# Patient Record
Sex: Female | Born: 1975
Health system: Southern US, Community
[De-identification: ages and names within clinical notes are randomized; demographics above are authoritative.]

## PROBLEM LIST (undated history)

## (undated) ENCOUNTER — Inpatient Hospital Stay (HOSPITAL_COMMUNITY): Payer: Self-pay

## (undated) DIAGNOSIS — M1380 Other specified arthritis, unspecified site: Secondary | ICD-10-CM

## (undated) DIAGNOSIS — E1165 Type 2 diabetes mellitus with hyperglycemia: Secondary | ICD-10-CM

## (undated) DIAGNOSIS — M1990 Unspecified osteoarthritis, unspecified site: Secondary | ICD-10-CM

## (undated) DIAGNOSIS — Z8742 Personal history of other diseases of the female genital tract: Secondary | ICD-10-CM

## (undated) DIAGNOSIS — N920 Excessive and frequent menstruation with regular cycle: Secondary | ICD-10-CM

## (undated) DIAGNOSIS — M159 Polyosteoarthritis, unspecified: Secondary | ICD-10-CM

## (undated) DIAGNOSIS — E669 Obesity, unspecified: Secondary | ICD-10-CM

## (undated) DIAGNOSIS — K219 Gastro-esophageal reflux disease without esophagitis: Secondary | ICD-10-CM

## (undated) DIAGNOSIS — E785 Hyperlipidemia, unspecified: Secondary | ICD-10-CM

## (undated) DIAGNOSIS — F32A Depression, unspecified: Secondary | ICD-10-CM

## (undated) DIAGNOSIS — M931 Kienbock's disease of adults: Secondary | ICD-10-CM

## (undated) DIAGNOSIS — E282 Polycystic ovarian syndrome: Secondary | ICD-10-CM

## (undated) DIAGNOSIS — D649 Anemia, unspecified: Secondary | ICD-10-CM

## (undated) DIAGNOSIS — M25851 Other specified joint disorders, right hip: Secondary | ICD-10-CM

## (undated) HISTORY — DX: Type 2 diabetes mellitus with hyperglycemia: E11.65

## (undated) HISTORY — DX: Other specified arthritis, unspecified site: M13.80

## (undated) HISTORY — PX: DILATION AND CURETTAGE OF UTERUS: SHX78

## (undated) HISTORY — DX: Hyperlipidemia, unspecified: E78.5

## (undated) HISTORY — DX: Personal history of other diseases of the female genital tract: Z87.42

## (undated) HISTORY — DX: Kienbock's disease of adults: M93.1

## (undated) HISTORY — DX: Obesity, unspecified: E66.9

---

## 2004-12-26 ENCOUNTER — Encounter: Payer: Self-pay | Admitting: Family Medicine

## 2006-03-06 ENCOUNTER — Encounter: Payer: Self-pay | Admitting: Family Medicine

## 2007-06-26 ENCOUNTER — Emergency Department: Payer: Self-pay | Admitting: Emergency Medicine

## 2007-11-10 ENCOUNTER — Encounter: Payer: Self-pay | Admitting: Family Medicine

## 2008-03-23 LAB — HM PAP SMEAR

## 2008-04-16 ENCOUNTER — Ambulatory Visit: Payer: Self-pay | Admitting: Family Medicine

## 2008-04-16 DIAGNOSIS — M25569 Pain in unspecified knee: Secondary | ICD-10-CM | POA: Insufficient documentation

## 2008-04-16 DIAGNOSIS — M7660 Achilles tendinitis, unspecified leg: Secondary | ICD-10-CM | POA: Insufficient documentation

## 2008-06-04 ENCOUNTER — Ambulatory Visit: Payer: Self-pay | Admitting: Family Medicine

## 2008-06-04 DIAGNOSIS — M76829 Posterior tibial tendinitis, unspecified leg: Secondary | ICD-10-CM | POA: Insufficient documentation

## 2008-07-01 ENCOUNTER — Ambulatory Visit: Payer: Self-pay | Admitting: Family Medicine

## 2008-07-01 DIAGNOSIS — J069 Acute upper respiratory infection, unspecified: Secondary | ICD-10-CM | POA: Insufficient documentation

## 2009-03-10 ENCOUNTER — Ambulatory Visit: Payer: Self-pay | Admitting: Family Medicine

## 2009-03-10 DIAGNOSIS — R413 Other amnesia: Secondary | ICD-10-CM | POA: Insufficient documentation

## 2009-03-10 DIAGNOSIS — H918X9 Other specified hearing loss, unspecified ear: Secondary | ICD-10-CM | POA: Insufficient documentation

## 2009-03-22 ENCOUNTER — Encounter: Payer: Self-pay | Admitting: Family Medicine

## 2009-04-04 ENCOUNTER — Encounter (INDEPENDENT_AMBULATORY_CARE_PROVIDER_SITE_OTHER): Payer: Self-pay | Admitting: *Deleted

## 2009-04-21 ENCOUNTER — Telehealth: Payer: Self-pay | Admitting: Family Medicine

## 2010-04-27 ENCOUNTER — Ambulatory Visit: Payer: Self-pay | Admitting: Family Medicine

## 2010-04-27 DIAGNOSIS — M25519 Pain in unspecified shoulder: Secondary | ICD-10-CM | POA: Insufficient documentation

## 2010-04-27 DIAGNOSIS — R0789 Other chest pain: Secondary | ICD-10-CM | POA: Insufficient documentation

## 2010-05-11 ENCOUNTER — Ambulatory Visit: Payer: Self-pay | Admitting: Obstetrics and Gynecology

## 2010-06-12 ENCOUNTER — Telehealth (INDEPENDENT_AMBULATORY_CARE_PROVIDER_SITE_OTHER): Payer: Self-pay | Admitting: *Deleted

## 2010-06-19 ENCOUNTER — Encounter (INDEPENDENT_AMBULATORY_CARE_PROVIDER_SITE_OTHER): Payer: Self-pay | Admitting: *Deleted

## 2010-07-13 ENCOUNTER — Ambulatory Visit: Payer: Self-pay | Admitting: Family Medicine

## 2010-07-19 ENCOUNTER — Encounter (INDEPENDENT_AMBULATORY_CARE_PROVIDER_SITE_OTHER): Payer: Self-pay | Admitting: *Deleted

## 2010-07-31 LAB — CONVERTED CEMR LAB
ALT: 34 units/L (ref 0–35)
Basophils Relative: 0.2 % (ref 0.0–3.0)
Bilirubin, Direct: 0.1 mg/dL (ref 0.0–0.3)
Chloride: 103 meq/L (ref 96–112)
Eosinophils Relative: 1.2 % (ref 0.0–5.0)
HCT: 40.2 % (ref 36.0–46.0)
LDL Cholesterol: 136 mg/dL — ABNORMAL HIGH (ref 0–99)
Lymphs Abs: 0.9 10*3/uL (ref 0.7–4.0)
MCV: 86.2 fL (ref 78.0–100.0)
Monocytes Absolute: 0.4 10*3/uL (ref 0.1–1.0)
Potassium: 4.6 meq/L (ref 3.5–5.1)
RBC: 4.67 M/uL (ref 3.87–5.11)
Sodium: 134 meq/L — ABNORMAL LOW (ref 135–145)
TSH: 1.49 microintl units/mL (ref 0.35–5.50)
Total CHOL/HDL Ratio: 5
Total Protein: 7.3 g/dL (ref 6.0–8.3)
WBC: 6.5 10*3/uL (ref 4.5–10.5)

## 2010-08-22 NOTE — Letter (Signed)
Summary: [place] No Show Letter  [place] at [facility]  [address] [place], [place] [address]   Phone: [phone]  Fax: [phone]    06/19/2010 MRN: [id number]  [facility] [address] [place], [place]  [address]   Dear Priscilla King,   Our records indicate that you missed your scheduled appointment with ___Lab________________ on __11.28.11__________.  Please contact this office to reschedule your appointment as soon as possible.  It is important that you keep your scheduled appointments with your physician, so we can provide you the best care possible.  Please be advised that there may be a charge for "no show" appointments.    Sincerely,   [place] at [facility]

## 2010-08-22 NOTE — Progress Notes (Signed)
----   Converted from flag ---- ---- 06/09/2010 12:59 PM, Hannah Beat MD wrote: Prephysical Labs, several days before, fasting BMP, HFP, FLP, CBC with diff, TSH: v77.91, v77.1, ,780.79   ---- 06/09/2010 12:12 PM, Liane Comber CMA (AAMA) wrote: Lab orders please! Good Morning! This pt is scheduled for cpx labs Monday, which labs to draw and dx codes to use? Thanks Tasha ------------------------------

## 2010-08-22 NOTE — Assessment & Plan Note (Signed)
Summary: ? REFLUX   Vital Signs:  Patient profile:   35 year old female Height:      68 inches Weight:      227.5 pounds BMI:     34.72 Temp:     98.5 degrees F oral Pulse rate:   76 / minute Pulse rhythm:   regular BP sitting:   110 / 70  (left arm) Cuff size:   regular  Vitals Entered By: Benny Lennert CMA Duncan Dull) (April 27, 2010 9:04 AM)  History of Present Illness: Chief complaint ? reflux  35 year old patient:  Having a significant pain Had a lot of pain that she had to double over. Had the pain once before.  Right sided chest pain. 3 weeks has been ongoing. Worse sometimes.   Maybe when moving neck feels like, also with bending over.   Not associated about, better when sitting up a little bit. Not associated with any food intake, hurts some with lying on side.  Feels like something is on her chest and squeezing her  But also in the shoulder blade. Pain with neck rotation that can provoke the pain.  Cardiac risk factors. no smoke. mom had a mini stroke. No other cardiac risk factors  when younger did use an injaler. None in more thaqn ten years ago  Allergies (verified): No Known Drug Allergies  Past History:  Past medical, surgical, family and social histories (including risk factors) reviewed, and no changes noted (except as noted below).  Past Medical History: Reviewed history from 06/04/2008 and no changes required. Keinbock's Disease, R hand, 1999, less mobility Patellofemoral pain B Achilles Tendinopathy, B, calcific tendonitis Possible seronegative inflammatory arthritis, GWK, KC  Mildly elevated HLA-B27: appeared negative on the labs I reviewed CCP Neg elevated ESR  Rheum, GWK, KC  Past Surgical History: Reviewed history from 04/16/2008 and no changes required. none  Family History: Reviewed history from 04/16/2008 and no changes required. Father, 15, multiple bone issues Mother, 36, CVA (TIA) 109  Aunt, ? [place]  Grandparents,  DM  Social History: Reviewed history from 04/16/2008 and no changes required. Married, Ritson No children [place] No exercise - knee Never Smoked Alcohol use-no Drug use-no  Review of Systems      See HPI General:  Denies chills, fatigue, and fever. CV:  Complains of chest pain or discomfort; denies difficulty breathing at night, difficulty breathing while lying down, palpitations, shortness of breath with exertion, swelling of feet, and swelling of hands. Resp:  Denies cough, shortness of breath, sputum productive, and wheezing. MS:  Complains of joint pain.  Physical Exam  General:  Well-developed,well-nourished,in no acute distress; alert,appropriate and cooperative throughout examination Head:  Normocephalic and atraumatic without obvious abnormalities. No apparent alopecia or balding. Ears:  no external deformities.   Nose:  no external deformity.     Detailed Back/Spine Exam  General:    GROSSLY ROM APPROACHES NORMAL NT SPINOUS PROCESSES SPURLINGS POS AND PRODUCES PAIN IN SHOULDER BLADE AND DOWN RIGHT ARM COMPARABLE TO PATIENT'S SENSATION   Shoulder/Elbow Exam  General:    Well-developed, well-nourished, normal body habitus; no deformities, normal grooming.    Skin:    Intact, no scars, lesions, rashes, cafe au lait spots or bruising.    Inspection:    Inspection is normal.    Palpation:    Non-tender to palpation bilaterally.    Shoulder Exam:    Right:    Inspection:  Normal    Palpation:  Normal  Stability:  stable    Tenderness:  no    Swelling:  no    Erythema:  no    Range of Motion:       Flexion-Active: 180       Extension-Active: 45       Flexion-Passive: 180       Extension-Passive: 45       External Rotation : 45       Interior Rotation : T7    Left:    Inspection:  Normal    Palpation:  Normal    Stability:  stable    Tenderness:  no    Swelling:  no    Erythema:  no    Range of Motion:       Flexion-Active: 180        Extension-Active: 45       Flexion-Passive: 180       Extension-Passive: 45       External Rotation : 45       Interior Rotation : T7  Elbow Exam:    Right:    Inspection:  Normal    Palpation:  Normal    Stability:  stable    Tenderness:  no    Swelling:  no    Erythema:  no    Range of Motion:       Flexion-Active: 135       Extension-Active: 0       Flexion-Passive: 135       Extension-Passive: 0       Elbow Flexion: > 60 seconds    Left:    Inspection:  Normal    Palpation:  Normal    Stability:  stable    Tenderness:  no    Swelling:  no    Erythema:  no    Range of Motion:       Flexion-Active: 135       Extension-Active: 0       Flexion-Passive: 135       Extension-Passive: 0       Elbow Flexion: > 60 seconds  Impingement Sign NEER:    Right negative; Left negative Impingement Sign HAWKINS:    Right negative; Left negative Sulcus Sign:    Right negative; Left negative Speeds:    Right negative; Left negative   Impression & Recommendations:  Problem # 1:  SHOULDER PAIN, RIGHT (ICD-719.41) Assessment New shoulder blade pain with chest pain and provocable from neck maneuvers most c/w neuropathic origin from nerve root history and exam not c/w cardiac in 36 yo with no risk factors exam and history not c/w pulm or gi process  titrate neurontin  The following medications were removed from the medication list:    Meloxicam 15 Mg Tabs (Meloxicam) .Marland Kitchen... 1 by mouth q day  Problem # 2:  CHEST PAIN, ATYPICAL (ICD-786.59) Assessment: New  Complete Medication List: 1)  Ortho Tri-cyclen (28) 0.035 Mg Tabs (Norgestimate-ethinyl estradiol) .... Take one tablet daily 2)  Voltaren 1 % Gel (Diclofenac sodium) .... Apply to foot 4 times daily 3)  Gabapentin 300 Mg Caps (Gabapentin) .Marland Kitchen.. 1 by mouth three times a day  Patient Instructions: 1)  [email or web address] 2)  ALL SHOULDER VIDEOS AND WORKOUT 3)  DO 3-4 TIMES A WEEK   4)  Gabapentin  titration: 5)  1 tablet at night for 3 days, then 1 tablet twice a day for 3 days, then start 1 tablet three times a day  Prescriptions: GABAPENTIN 300 MG  CAPS (  GABAPENTIN) 1 by mouth three times a day  #90 x 3   Entered and Authorized by:   Hannah Beat MD   Signed by:   Hannah Beat MD on 04/27/2010   Method used:   Electronically to        [facility] [address] (retail)       [address], [address]       [place], [place]  [address]       Ph: [phone]       Fax: [phone]   RxID:   [id number]   Current Allergies (reviewed today): No known allergies

## 2010-08-24 NOTE — Assessment & Plan Note (Signed)
Summary: CPX/DLO  R/S FROM 06/22/10   Vital Signs:  Patient profile:   35 year old female Height:      68 inches Weight:      229.25 pounds BMI:     34.98 Temp:     98.6 degrees F oral Pulse rate:   84 / minute Pulse rhythm:   regular BP sitting:   126 / 78  (left arm) Cuff size:   large  Vitals Entered By: Benny Lennert CMA Duncan Dull) (July 13, 2010 8:57 AM)  History of Present Illness: Chief complaint cpx  35 year old: CPX:  f/u shoulder blade pain: still having some pain - went better, titrate up to three times a day, then came   Flu - has never had this Tdap: not in the last 10 years  On sabaatical til Feb.   Mobic rehab Neurontin  Dr. Arvella Merles, mammo, [place] - all OK.     Preventive Screening-Counseling & Management  Alcohol-Tobacco     Alcohol drinks/day: <1     Smoking Status: never  Caffeine-Diet-Exercise     Diet Counseling: not indicated; diet is assessed to be healthy     Does Patient Exercise: yes     Type of exercise: pool  Hep-HIV-STD-Contraception     HIV Risk: no risk noted     STD Risk: no risk noted      Sexual History:  currently monogamous.        Drug Use:  never.    Clinical Review Panels:  Prevention   Last Pap Smear:  normal (03/23/2008)   Allergies (verified): No Known Drug Allergies  Past History:  Past medical, surgical, family and social histories (including risk factors) reviewed, and no changes noted (except as noted below).  Past Medical History: Reviewed history from 06/04/2008 and no changes required. Keinbock's Disease, R hand, 1999, less mobility Patellofemoral pain B Achilles Tendinopathy, B, calcific tendonitis Possible seronegative inflammatory arthritis, GWK, KC  Mildly elevated HLA-B27: appeared negative on the labs I reviewed CCP Neg elevated ESR  Rheum, GWK, KC  Past Surgical History: Reviewed history from 04/16/2008 and no changes required. none  Family History: Reviewed history from  04/16/2008 and no changes required. Father, 50, multiple bone issues Mother, 32, CVA (TIA) 7  Aunt, ? [place]  Grandparents, DM  Social History: Reviewed history from 04/16/2008 and no changes required. Married, Ritson No children [place] No exercise - knee Never Smoked Alcohol use-no Drug use-no Does Patient Exercise:  yes HIV Risk:  no risk noted STD Risk:  no risk noted Sexual History:  currently monogamous Drug Use:  never  Review of Systems  General: Denies fever, chills, sweats, and anorexia. Eyes: Denies blurring. ENT: Denies earache, ear discharge, decreased hearing, nasal congestion, and sore throat. CV: Denies chest pains, dyspnea on exertion, palpitations, and syncope. Resp: Denies cough, cough with exercise, dyspnea at rest, excessive sputum, nighttime cough or wheeze, and wheezing GI: Denies nausea, vomiting, diarrhea, constipation, change in bowel habits, abdominal pain, melena, BRBPR  GU: dysuria, discharge, frequency,genital sores, STD concern. MS: r SHOULDER AS BELOW Derm: No rash, itching, and dryness Neuro: No abnormal gait, frequent headachesOCC SHOULDER BLADE PAIN, THIS WAS MADE BETTER WITH NEURONTIN, CAME BACK WITH TITRATION OFF Psych: No anxiety, behavioral problems, compulsive behavior, depression, hyperactivity, and inattentive. Endo: No polydipsia, polyphagia, polyuria, and unusual weight change Heme: No bruising or LAD Allergy: No urticaria or hayfever   Otherwise, the pertinent positives and negatives are listed above and in the HPI, otherwise  a full review of systems has been reviewed and is negative unless noted positive.   Physical Exam  General:  Well-developed,well-nourished,in no acute distress; alert,appropriate and cooperative throughout examination Head:  Normocephalic and atraumatic without obvious abnormalities. No apparent alopecia or balding. Ears:  no external deformities.   Nose:  no external deformity.   Neck:  No deformities,  masses, or tenderness noted. Chest Wall:  No deformities, masses, or tenderness noted. Lungs:  Normal respiratory effort, chest expands symmetrically. Lungs are clear to auscultation, no crackles or wheezes. Heart:  Normal rate and regular rhythm. S1 and S2 normal without gallop, murmur, click, rub or other extra sounds. Abdomen:  Bowel sounds positive,abdomen soft and non-tender without masses, organomegaly or hernias noted. Msk:  normal ROM.   Extremities:  No clubbing, cyanosis, edema, or deformity noted with normal full range of motion of all joints.   Neurologic:  alert & oriented X3 and gait normal.   Skin:  Intact without suspicious lesions or rashes Cervical Nodes:  No lymphadenopathy noted Psych:  Cognition and judgment appear intact. Alert and cooperative with normal attention span and concentration. No apparent delusions, illusions, hallucinations   Impression & Recommendations:  Problem # 1:  HEALTH MAINTENANCE EXAM (ICD-V70.0) The patient's preventative maintenance and recommended screening tests for an annual wellness exam were reviewed in full today. Brought up to date unless services declined.  Counselled on the importance of diet, exercise, and its role in overall health and mortality. The patient's FH and SH was reviewed, including their home life, tobacco status, and drug and alcohol status.   refill mobic refill neurontin  Orders: TLB-BMP (Basic Metabolic Panel-BMET) (80048-METABOL) TLB-CBC Platelet - w/Differential (85025-CBCD) TLB-Hepatic/Liver Function Pnl (80076-HEPATIC) TLB-TSH (Thyroid Stimulating Hormone) (84443-TSH) Venipuncture ([address])  Complete Medication List: 1)  Ortho Tri-cyclen (28) 0.035 Mg Tabs (Norgestimate-ethinyl estradiol) .... Take one tablet daily 2)  Voltaren 1 % Gel (Diclofenac sodium) .... Apply to foot 4 times daily 3)  Gabapentin 600 Mg Tabs (Gabapentin) .Marland Kitchen.. 1 by mouth three times a day 4)  Meloxicam 15 Mg Tabs (Meloxicam) .... One  by mouth daily  Other Orders: TLB-Lipid Panel (80061-LIPID) Prescriptions: MELOXICAM 15 MG TABS (MELOXICAM) one by mouth daily  #30 x 11   Entered and Authorized by:   Hannah Beat MD   Signed by:   Hannah Beat MD on 07/13/2010   Method used:   Electronically to        [facility] [address] (retail)       [address], [address]       [place], [place]  [address]       Ph: [phone]       Fax: [phone]   RxID:   [id number] GABAPENTIN 600 MG TABS (GABAPENTIN) 1 by mouth three times a day  #90 x 5   Entered and Authorized by:   Hannah Beat MD   Signed by:   Hannah Beat MD on 07/13/2010   Method used:   Electronically to        [facility] [address] (retail)       [address], [address]       [place], [place]  [address]       Ph: [phone]       Fax: [phone]   RxID:   [id number]    Orders Added: 1)  TLB-Lipid Panel [80061-LIPID] 2)  TLB-BMP (Basic Metabolic Panel-BMET) [80048-METABOL] 3)  TLB-CBC Platelet - w/Differential [85025-CBCD] 4)  TLB-Hepatic/Liver Function Pnl [80076-HEPATIC] 5)  TLB-TSH (Thyroid Stimulating Hormone) [84443-TSH] 6)  Venipuncture [04540] 7)  Est. Patient 18-39 years [[address]]    Current Allergies (reviewed today): No known allergies   Prevention & Chronic Care Immunizations   Influenza vaccine: Not documented   Influenza vaccine deferral: Refused  (07/13/2010)    Tetanus booster: Not documented   Td booster deferral: Not indicated  (07/13/2010)    Pneumococcal vaccine: Not documented  Other Screening   Pap smear: normal  (03/23/2008)   Pap smear action/deferral: Deferred  (07/13/2010)   Pap smear due: 03/23/2009   Smoking status: never  (07/13/2010)

## 2010-08-24 NOTE — Letter (Signed)
Summary: Generic Letter  [place] at [facility]  [address] [place], [place] [address]   Phone: [phone]  Fax: [phone]    07/19/2010     [facility] [address] [place], [place]  [address]     Dear Ms. Guynes,   Her blood sugar was 121 -- this is borderline diabetic range. (not true diabetes) Strongly suggest losing weight, getting back in the pool, exercising what she can do  American diabetes association website is a tremendous resource on diet changes and suggestions.   I want to recheck her in 6 months, fasting several days before: BMP, Hga1c, FLP: 272.4, v77.1           Sincerely,   Benny Lennert CMA (AAMA)

## 2010-11-13 ENCOUNTER — Ambulatory Visit: Payer: Self-pay | Admitting: Obstetrics and Gynecology

## 2011-04-05 ENCOUNTER — Ambulatory Visit (INDEPENDENT_AMBULATORY_CARE_PROVIDER_SITE_OTHER): Payer: BC Managed Care – PPO | Admitting: Family Medicine

## 2011-04-05 ENCOUNTER — Encounter: Payer: Self-pay | Admitting: Family Medicine

## 2011-04-05 DIAGNOSIS — F458 Other somatoform disorders: Secondary | ICD-10-CM

## 2011-04-05 MED ORDER — HYDROCODONE-ACETAMINOPHEN 5-500 MG PO TABS: 1.0000 | ORAL_TABLET | Freq: Every day | ORAL | Status: DC

## 2011-04-05 NOTE — Patient Instructions (Signed)
Take Vicodin as needed at night. Take IBP 200mg  4 tabds after meals until Sun, then gradually decrease.

## 2011-04-05 NOTE — Progress Notes (Signed)
  Subjective:    Patient ID: Priscilla King, female    DOB: August 31, 1975, 35 y.o.   MRN: [id number]  HPI Pt of Dr Cyndie Chime here as acute appt for right facial headache. She has had headaches since last Friday which linger even with signif doses of IBP (2 once a day). She wonders about tension and has used her mouthpiece again for grinding of her teeth. 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 She otherwise feels well.  She has a history of grinding of the teeth for some time, used a mouthguard for a while and thought she was better so hasn't used it for a while. She hurts in the jaw area with radiation toward the ear. She does not and has not had tooth pain. She has no congestion, ST, cough or rhinitis. She denies fever or chills.    Review of SystemsNoncontributory except as above.       Objective:   Physical Exam  Constitutional: She appears well-developed and well-nourished. No distress.  HENT:  Head: Normocephalic and atraumatic.  Right Ear: External ear normal.  Left Ear: External ear normal.  Nose: Nose normal.  Mouth/Throat: Oropharynx is clear and moist. No oropharyngeal exudate.       Mouth without sign of inflammation or swelling. No sign of infection or tooth problem.  Eyes: Conjunctivae and EOM are normal. Pupils are equal, round, and reactive to light.  Neck: Normal range of motion. Neck supple. No thyromegaly present.  Cardiovascular: Normal rate, regular rhythm and normal heart sounds.   Pulmonary/Chest: Effort normal and breath sounds normal. She has no wheezes. She has no rales.  Lymphadenopathy:    She has no cervical adenopathy.  Skin: She is not diaphoretic.          Assessment & Plan:

## 2011-04-05 NOTE — Assessment & Plan Note (Addendum)
Wear guard regularly at night for a while, consider wearing all the time. Use Vicodin as needed. See instructions.

## 2011-04-12 ENCOUNTER — Encounter: Payer: Self-pay | Admitting: Family Medicine

## 2011-04-12 ENCOUNTER — Encounter: Payer: Self-pay | Admitting: Neurology

## 2011-04-12 ENCOUNTER — Ambulatory Visit (INDEPENDENT_AMBULATORY_CARE_PROVIDER_SITE_OTHER): Payer: BC Managed Care – PPO | Admitting: Family Medicine

## 2011-04-12 VITALS — BP 122/82 | HR 81 | Temp 99.0°F | Resp 12 | Wt 230.1 lb

## 2011-04-12 DIAGNOSIS — R519 Headache, unspecified: Secondary | ICD-10-CM | POA: Insufficient documentation

## 2011-04-12 DIAGNOSIS — R51 Headache: Secondary | ICD-10-CM | POA: Insufficient documentation

## 2011-04-12 MED ORDER — HYDROCODONE-ACETAMINOPHEN 5-500 MG PO TABS: 1.0000 | ORAL_TABLET | Freq: Four times a day (QID) | ORAL | Status: AC | PRN

## 2011-04-12 MED ORDER — TRAMADOL HCL 50 MG PO TABS: 50.0000 mg | ORAL_TABLET | Freq: Four times a day (QID) | ORAL | Status: DC | PRN

## 2011-04-12 NOTE — Patient Instructions (Addendum)
Refer to Dr [name]  for headaches.  Take Vicodin as needed.  Try Tramadol as an alternative. 30 mins spent with pt.

## 2011-04-12 NOTE — Assessment & Plan Note (Signed)
Presume this headache to be caused by jaw pain which I cannot reproduce. Does not seem to be a dental problem altho could be subclinical; jaw abscess unable to be palpated. Will refer to Neurology.

## 2011-04-12 NOTE — Progress Notes (Signed)
  Subjective:    Patient ID: Priscilla King, female    DOB: [birth year], 35 y.o.   MRN: [id number]  HPI This is a pt of Dr Copland's I saw last week for headaches which were felt to be due to teeth grinding. She is back today because her headaches are getting worse. She tried to back off her IBP and couldn't handle that. Yesterday she took a Vicodin. Her headache is there all the time. Nothing seems to make it better. When she initially had the problem, she called her dentist and he said to see her PCP. She does not feel acute tooth pain other than the pain is focal and intense as a toothache. She has no obvious swelling or tenderness of the jaw or gums. She still has had no fever or chills, congestion or cough. 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  HENT: Negative for ear pain, congestion, rhinorrhea and postnasal drip.   Eyes: Negative for redness.  Respiratory: Negative for cough, chest tightness, shortness of breath and wheezing.   Cardiovascular: Negative for chest pain.  Gastrointestinal: Negative for abdominal pain, diarrhea, constipation and abdominal distention.       Objective:   Physical Exam  Constitutional: She appears well-developed and well-nourished. No distress.  HENT:  Head: Normocephalic and atraumatic.  Right Ear: External ear normal.  Left Ear: External ear normal.  Nose: Nose normal.  Mouth/Throat: Oropharynx is clear and moist. No oropharyngeal exudate.       No tenderness anywhere in the mouth to palpation. No swelling of the cheeks with no inflammation of Stenson's duct. Sinuses nontender with no nasal congestion.  Eyes: Conjunctivae and EOM are normal. Pupils are equal, round, and reactive to light.  Neck: Normal range of motion. Neck supple. No thyromegaly  present.  Cardiovascular: Normal rate, regular rhythm and normal heart sounds.   Pulmonary/Chest: Effort normal and breath sounds normal. She has no wheezes. She has no rales.  Lymphadenopathy:    She has no cervical adenopathy.  Skin: She is not diaphoretic.          Assessment & Plan:

## 2011-04-19 ENCOUNTER — Ambulatory Visit: Payer: BC Managed Care – PPO | Admitting: Neurology

## 2011-05-17 ENCOUNTER — Ambulatory Visit: Payer: BC Managed Care – PPO | Admitting: Neurology

## 2011-05-24 ENCOUNTER — Encounter: Payer: Self-pay | Admitting: Neurology

## 2011-05-24 ENCOUNTER — Ambulatory Visit (INDEPENDENT_AMBULATORY_CARE_PROVIDER_SITE_OTHER): Payer: BC Managed Care – PPO | Admitting: Neurology

## 2011-05-24 VITALS — BP 122/76 | HR 88 | Ht 68.0 in | Wt 231.0 lb

## 2011-05-24 DIAGNOSIS — R51 Headache: Secondary | ICD-10-CM

## 2011-05-24 NOTE — Progress Notes (Signed)
Dear Dr. Hetty Ely,  Thank you for having me see Priscilla King in consultation today at [facility] for her problem with headaches.  As you may recall, she is a 35 y.o. year old female with a history of avascular necrosis of the right wrist, who has had paroxysmal headaches of right jaw pain radiating to the right face for the last year.  These became constant over the last 3 weeks, with throbbing pain involving the right face.  There is photophobia accompanying them.  They seem to less involve the jaw at this point in time and more involve the temple.  They have improved somewhat -- she was using ibuprofen which helped, but bothered her stomach.  She was switched to Vicodin which made the pain worse.  She is now on tramadol which helps somewhat.  She has had some problems with chronic right sided hearing loss - although audiometry was normal.  Past Medical History  Diagnosis Date  . ESR raised     elevated  . Achilles tendinosis     B, calcific tendonitis  . 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  History   Social History  . Marital Status: Married    Spouse Name: N/A    Number of Children: 0  . Years of Education: N/A   Occupational History  .     Social History Main Topics  . Smoking status: Never Smoker   . Smokeless tobacco: Never Used  . Alcohol Use: No  . Drug Use: No  . Sexually Active: None   Other Topics Concern  . None   Social History Narrative   Married, [place] knee    Family History  Problem Relation Age of Onset  . Stroke Mother     CVA (TIA)  . Diabetes Other       ROS:  13 systems were reviewed and are notable for right sided hearing loss.  She has chronic right wrist pain from her avascular necrosis.  All other review of systems are unremarkable.   Examination:  Filed Vitals:   05/24/11 1407  BP: 122/76  Pulse: 88  Height: 5\' 8"  (1.727 m)  Weight: 231 lb (104.781 kg)     In general, well appearing woman in NAD.  Cardiovascular: The patient has a regular rate and rhythm and no carotid bruits.  Fundoscopy:  Disks are flat. Vessel caliber within normal limits.  Mental status:   The patient is oriented to person, place and time. Recent and remote memory are intact. Attention span and concentration are normal. Language including repetition, naming, following commands are intact. Fund of knowledge of current and historical events, as well as vocabulary are normal.  Cranial Nerves: Pupils are equally round and reactive to light. Visual fields full to confrontation. Extraocular movements are intact without nystagmus. Facial sensation and muscles of mastication are intact. Muscles of facial expression reveal mild right lower facial weakness. Hearing slightly decreased on the right to 256 tuning fork.  Weber lateralizes right.  Air > bone in the right. Tongue protrusion, uvula, palate midline.  Shoulder shrug intact.  Corneals intact.  Motor:  The patient has normal bulk and tone, no pronator drift.  There are no adventitious movements.  5/5 bilaterally.    Reflexes:   Biceps  Triceps Brachioradialis Knee Ankle  Right 2+  2+  2+   2+ 2+  Left  2+  2+  2+   2+  2+  Toes down  Coordination:  Normal finger to nose.  No dysdiadokinesia.  Sensation is intact to temperature and vibration.  Gait and Station are normal.  Tandem gait is intact.  Romberg is negative   Impression/Recs: Headaches along with right jaw pain that were paroxysmal, now persistent with photophobia, throbbing pain in the right temple.  They sound very migrainous, but she has findings that may refer to the CP angle on the right.  I would be concerned about a CP angle tumor such as acoustic schwannoma, etc.  I will get an MRI brain.  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   We will see the patient  back in 2 weeks after her MRI brain.  Thank you for having [place] see Priscilla King in consultation.  Feel free to contact me with any questions.  [name] [name], MD [facility],  [address] [place], [place] [address] Phone: [phone] Fax: [phone].

## 2011-05-24 NOTE — Patient Instructions (Signed)
Your MRI is scheduled for Wednesday,  November 7th 7:00pm. Please arrive to [facility] MRI at 6:45pm.  [phone].  Your follow up appointment with Dr. [name] is on Thursday, November 15th at 2:30pm.

## 2011-05-30 ENCOUNTER — Ambulatory Visit (HOSPITAL_COMMUNITY)
Admission: RE | Admit: 2011-05-30 | Discharge: 2011-05-30 | Disposition: A | Discharge status: Home / Self Care | Payer: BC Managed Care – PPO | Source: Ambulatory Visit | Attending: Neurology | Admitting: Neurology

## 2011-05-30 DIAGNOSIS — R51 Headache: Secondary | ICD-10-CM

## 2011-05-31 ENCOUNTER — Telehealth: Payer: Self-pay | Admitting: Neurology

## 2011-05-31 MED ORDER — ALPRAZOLAM 1 MG PO TABS: ORAL_TABLET | ORAL | Status: DC

## 2011-05-31 NOTE — Telephone Encounter (Signed)
I have pt rescheduled, but she also would like med for claustrophobia sent to her pharmacy.

## 2011-05-31 NOTE — Telephone Encounter (Signed)
Explained everything to pt's husband and called in xanax 1mg  #2 to their pharmacy.

## 2011-05-31 NOTE — Telephone Encounter (Signed)
Pt needs an open MRI scheduled due to claustrophobia. Can try her at her mobile number or at [phone]. She also asked about getting some medication to calm her nerves during the scan.

## 2011-05-31 NOTE — Telephone Encounter (Signed)
Pt would also like someone call her to explain "how the xanax works."

## 2011-05-31 NOTE — Telephone Encounter (Signed)
tiffany - can you handle this.

## 2011-06-01 ENCOUNTER — Other Ambulatory Visit: Payer: BC Managed Care – PPO

## 2011-06-03 ENCOUNTER — Other Ambulatory Visit: Payer: Self-pay | Admitting: Neurology

## 2011-06-03 ENCOUNTER — Ambulatory Visit
Admission: RE | Admit: 2011-06-03 | Discharge: 2011-06-03 | Disposition: A | Discharge status: Home / Self Care | Payer: BC Managed Care – PPO | Source: Ambulatory Visit | Attending: Neurology | Admitting: Neurology

## 2011-06-03 DIAGNOSIS — R51 Headache: Secondary | ICD-10-CM

## 2011-06-03 MED ORDER — GADOBENATE DIMEGLUMINE 529 MG/ML IV SOLN
20.0000 mL | Freq: Once | INTRAVENOUS | Status: AC | PRN
  Administered 2011-06-03: 20 mL via INTRAVENOUS

## 2011-06-11 ENCOUNTER — Ambulatory Visit (INDEPENDENT_AMBULATORY_CARE_PROVIDER_SITE_OTHER): Payer: BC Managed Care – PPO | Admitting: Neurology

## 2011-06-11 ENCOUNTER — Ambulatory Visit: Payer: BC Managed Care – PPO | Admitting: Neurology

## 2011-06-11 ENCOUNTER — Encounter: Payer: Self-pay | Admitting: Neurology

## 2011-06-11 VITALS — BP 110/78 | HR 84 | Wt 231.0 lb

## 2011-06-11 DIAGNOSIS — R51 Headache: Secondary | ICD-10-CM

## 2011-06-11 DIAGNOSIS — R519 Headache, unspecified: Secondary | ICD-10-CM

## 2011-06-11 NOTE — Patient Instructions (Addendum)
Go to [facility] to have your labs drawn.  Your CT head is scheduled for Thursday, November 29th at 8:30am.  Please arrive to [facility] by 8:15am. Nothing to eat or drink 4 hours prior to scan.

## 2011-06-11 NOTE — Progress Notes (Signed)
Dear Dr. Patsy Lager,  I saw  Priscilla King back in [facility] for her problem with right sided headaches.  As you may recall, she is a 35 y.o. year old female with a history of avascular necrosis of the right wrist who has developed right temporal headaches.  At her first visit I was suspicious of some right sided facial droop as well as right hearing loss.  I got an MRI of the brain with and without contrast which was unrevealing to look for a CP angle tumor.  The patient returns today continuing to complain of headaches, with ibuprofen improving them.  These are constant localized to the right temple.  She takes tramadol intermittently for them.   Medical history, social history, family history, medications and allergies were reviewed and have not changed since the last clinic vist.  ROS:  13 systems were reviewed and are notable for chronic pain in the right wrist and right knee.  She has a history of increased ESR.  All other review of systems are unremarkable.  Exam: . Filed Vitals:   06/11/11 0853  BP: 110/78  Pulse: 84  Weight: 231 lb (104.781 kg)    In general, well appearing woman in NAD.  H&N:  no pain to palpation of jaw or inside mouth.  Cranial Nerves: Pupils are equally round and reactive to light.  Extraocular movements are intact without nystagmus. Facial sensation and muscles of mastication are intact. Muscles of facial expression reveal mild right sided facial droop in the lower face. Hearing decreased on the right with AC>BC and lateralizes to the left.(sensorineural hearing loss).  Tongue protrusion, uvula, palate midline.  Shoulder shrug intact.  Motor:  Normal bulk and tone, no drift and 5/5 muscle strength bilaterally.  Reflexes:  2+ thoughout, toes down.  Gait:  Normal gait and station.  Romberg negative.  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  I am going to get a CT of her head with and without contrast to look for bony inflammation.  She is going to use meloxicam regularly for her headache, as I suspect the inflammatory properties may be useful.  We will also get an ESR, CRP, ANA and RF.  If we confirm inflammation, then I will consider a trial of steroids, but she may need referral to rheumatology.  We will see the patient back in 6 weeks.  Priscilla King [name], MD [facility], [place]

## 2011-06-12 ENCOUNTER — Other Ambulatory Visit (INDEPENDENT_AMBULATORY_CARE_PROVIDER_SITE_OTHER): Payer: BC Managed Care – PPO

## 2011-06-12 DIAGNOSIS — R519 Headache, unspecified: Secondary | ICD-10-CM

## 2011-06-12 DIAGNOSIS — R51 Headache: Secondary | ICD-10-CM

## 2011-06-12 LAB — SEDIMENTATION RATE: Sed Rate: 29 mm/hr — ABNORMAL HIGH (ref 0–22)

## 2011-06-13 LAB — RHEUMATOID FACTOR: Rhuematoid fact SerPl-aCnc: <10 IU/mL (ref <=14)

## 2011-06-13 LAB — ANA: Anti Nuclear Antibody(ANA): NEGATIVE

## 2011-06-21 ENCOUNTER — Ambulatory Visit (HOSPITAL_COMMUNITY)
Admission: RE | Admit: 2011-06-21 | Discharge: 2011-06-21 | Disposition: A | Discharge status: Home / Self Care | Payer: BC Managed Care – PPO | Source: Ambulatory Visit | Attending: Neurology | Admitting: Neurology

## 2011-06-21 ENCOUNTER — Other Ambulatory Visit: Payer: Self-pay | Admitting: Neurology

## 2011-06-21 DIAGNOSIS — R519 Headache, unspecified: Secondary | ICD-10-CM

## 2011-06-21 DIAGNOSIS — R51 Headache: Secondary | ICD-10-CM | POA: Insufficient documentation

## 2011-06-21 DIAGNOSIS — R209 Unspecified disturbances of skin sensation: Secondary | ICD-10-CM | POA: Insufficient documentation

## 2011-06-28 ENCOUNTER — Telehealth: Payer: Self-pay | Admitting: Internal Medicine

## 2011-07-25 ENCOUNTER — Telehealth: Payer: Self-pay | Admitting: Neurology

## 2011-07-25 NOTE — Telephone Encounter (Signed)
Spoke to Dr. Caryl Comes.  Told her I still believe that her pain in her temporal muscle is coming from a misaligned jaw.  I did an Therapist, art and there is a neuromuscular dentist named Ramiro Harvest who I referred her to.  She is going to try him and see if he has any answers.

## 2011-07-25 NOTE — Telephone Encounter (Signed)
I called the patient's home number to remind her of her follow up appointment tomorrow with Dr. [name] at 1000 am. She stated she needed to cancel and was waiting to hear from either Dr. [name] or Dr. Patsy Lager as to what she needed to do next. She states that Dr. [name] called her re: results of her CT and she just needs to know what the "next step" is.  I told her I would check with Dr. [name] to see what needed to be done and that I would let her know. **Dr. [name], please advise next step for this patient. From the last office note, it sounds like we may need to refer to rheumatology? Thank you.

## 2011-07-25 NOTE — Telephone Encounter (Signed)
Excellent - I was planning to call patient with this information

## 2011-07-26 ENCOUNTER — Ambulatory Visit: Payer: BC Managed Care – PPO | Admitting: Neurology

## 2011-08-01 NOTE — Telephone Encounter (Signed)
Opened in error by Pam Clement 

## 2012-01-05 ENCOUNTER — Emergency Department: Payer: Self-pay | Admitting: Emergency Medicine

## 2012-01-05 LAB — COMPREHENSIVE METABOLIC PANEL
Albumin: 4 g/dL (ref 3.4–5.0)
Alkaline Phosphatase: 77 U/L (ref 50–136)
Anion Gap: 9 (ref 7–16)
Calcium, Total: 9.2 mg/dL (ref 8.5–10.1)
Chloride: 109 mmol/L — ABNORMAL HIGH (ref 98–107)
Co2: 21 mmol/L (ref 21–32)
EGFR (African American): >60
EGFR (Non-African Amer.): >60
Glucose: 84 mg/dL (ref 65–99)
Potassium: 3.5 mmol/L (ref 3.5–5.1)
SGOT(AST): 18 U/L (ref 15–37)

## 2012-01-05 LAB — URINALYSIS, COMPLETE
Bacteria: NONE SEEN
Bilirubin,UR: NEGATIVE
Blood: NEGATIVE
Glucose,UR: NEGATIVE mg/dL (ref 0–75)
Leukocyte Esterase: NEGATIVE
Nitrite: NEGATIVE
Ph: 5 (ref 4.5–8.0)
Protein: NEGATIVE
Specific Gravity: 1.024 (ref 1.003–1.030)
Squamous Epithelial: 5
WBC UR: 2 /HPF (ref 0–5)

## 2012-01-05 LAB — CBC
HCT: 39.7 % (ref 35.0–47.0)
HGB: 13 g/dL (ref 12.0–16.0)
MCHC: 32.9 g/dL (ref 32.0–36.0)
RBC: 4.48 10*6/uL (ref 3.80–5.20)
RDW: 14.4 % (ref 11.5–14.5)
WBC: 10.9 10*3/uL (ref 3.6–11.0)

## 2012-01-06 LAB — WET PREP, GENITAL

## 2012-02-04 ENCOUNTER — Ambulatory Visit: Payer: Self-pay | Admitting: Obstetrics and Gynecology

## 2012-02-11 LAB — OB RESULTS CONSOLE RUBELLA ANTIBODY, IGM: Rubella: IMMUNE

## 2012-02-11 LAB — OB RESULTS CONSOLE HGB/HCT, BLOOD: HCT: 42 %

## 2012-02-11 LAB — OB RESULTS CONSOLE ABO/RH

## 2012-02-11 LAB — OB RESULTS CONSOLE HIV ANTIBODY (ROUTINE TESTING): HIV: NONREACTIVE

## 2012-02-11 LAB — OB RESULTS CONSOLE PLATELET COUNT: Platelets: 309 10*3/uL

## 2012-02-11 LAB — OB RESULTS CONSOLE GC/CHLAMYDIA: Chlamydia: NEGATIVE

## 2012-02-11 LAB — OB RESULTS CONSOLE VARICELLA ZOSTER ANTIBODY, IGG: Varicella: IMMUNE

## 2012-02-21 ENCOUNTER — Ambulatory Visit: Payer: Self-pay | Admitting: Obstetrics and Gynecology

## 2012-02-28 ENCOUNTER — Encounter: Payer: Self-pay | Admitting: Obstetrics and Gynecology

## 2012-04-04 ENCOUNTER — Ambulatory Visit (INDEPENDENT_AMBULATORY_CARE_PROVIDER_SITE_OTHER): Payer: BC Managed Care – PPO | Admitting: Family Medicine

## 2012-04-04 ENCOUNTER — Ambulatory Visit: Payer: BC Managed Care – PPO | Admitting: Family Medicine

## 2012-04-04 ENCOUNTER — Encounter: Payer: Self-pay | Admitting: Family Medicine

## 2012-04-04 VITALS — BP 126/86 | HR 82 | Temp 98.4°F | Wt 207.5 lb

## 2012-04-04 DIAGNOSIS — M545 Low back pain, unspecified: Secondary | ICD-10-CM | POA: Insufficient documentation

## 2012-04-04 NOTE — Progress Notes (Signed)
  Subjective:    Patient ID: Priscilla King, female    DOB: Jul 13, 1976, 36 y.o.   MRN: [id number]  HPI CC: lower back pain  Pregnant, 18 wks.  No vag bleeding.  Good fetal movement.  5d ago with bad ache in lower back.  Next day, pain radiating down anterior leg.  Noted numbness anterior thigh as well.  Leg pain throbbing, back pain sharp "back breaking pain".  Denies inciting trauma, falls.  Has been sleeping on left side, uncomfortable.  Denies leg weakness.  No fevers/chills, bowel/bladder accidents, no abd pain, no SOB.  ES tylenol helped, taking 2 at a time.  Past Medical History  Diagnosis Date  . ESR raised     elevated  . Achilles tendinosis     B, calcific tendonitis  . 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  ESR remains elevated.  Review of Systems Per HPI    Objective:   Physical Exam  Nursing note and vitals reviewed. Constitutional: She appears well-developed and well-nourished. No distress.  Musculoskeletal:       No midline spine tenderness, no paraspinous mm tenderness. Neg SLR bilaterally. +Pain at hip with ext rotation of right hip. Neg FABER bilaterally. No pain with palpation of SIJ,GTB, sciatic notch. No pain or weakness with testing of hip flexors, extensors, adductors/abductors against resistance.  Able to heel and toe walk  Neurological: She is alert. She has normal strength. No sensory deficit. She exhibits normal muscle tone. Gait normal.       Diminished reflexes bilaterally       Assessment & Plan:

## 2012-04-04 NOTE — Assessment & Plan Note (Signed)
Currently pregnant. Story more consistent with sciatica type picture, but exam more consistent with hip pathology. As seems to have improved with tylenol, recommended schedule tylenol 1000mg  tid for next week to see if will resolve.  Provided with stretching exercises from [facility] pt advisor. If not improved, advised to call us next week, would consider imaging of R hip to eval for AVN.

## 2012-04-04 NOTE — Patient Instructions (Addendum)
This is either sciatica or possibly hip arthritis. Given pregnancy, I'd avoid several other meds and xray currently. Treat with tylenol 1000mg  three times a day scheduled for the next week. Call us next week with an update on how you're doing. If not improving, may discuss imaging.

## 2012-04-15 ENCOUNTER — Ambulatory Visit (INDEPENDENT_AMBULATORY_CARE_PROVIDER_SITE_OTHER): Payer: BC Managed Care – PPO | Admitting: Obstetrics & Gynecology

## 2012-04-15 ENCOUNTER — Encounter: Payer: Self-pay | Admitting: Obstetrics & Gynecology

## 2012-04-15 VITALS — BP 98/71 | Wt 207.0 lb

## 2012-04-15 DIAGNOSIS — O0992 Supervision of high risk pregnancy, unspecified, second trimester: Secondary | ICD-10-CM | POA: Insufficient documentation

## 2012-04-15 DIAGNOSIS — Z3490 Encounter for supervision of normal pregnancy, unspecified, unspecified trimester: Secondary | ICD-10-CM

## 2012-04-15 DIAGNOSIS — Z3480 Encounter for supervision of other normal pregnancy, unspecified trimester: Secondary | ICD-10-CM

## 2012-04-15 DIAGNOSIS — IMO0002 Reserved for concepts with insufficient information to code with codable children: Secondary | ICD-10-CM

## 2012-04-15 DIAGNOSIS — O09529 Supervision of elderly multigravida, unspecified trimester: Secondary | ICD-10-CM

## 2012-04-15 DIAGNOSIS — E282 Polycystic ovarian syndrome: Secondary | ICD-10-CM | POA: Insufficient documentation

## 2012-04-15 NOTE — Progress Notes (Signed)
Patient transferred care from [facility]. According to her records, she is a A2 GDM which they noted at 9 weeks, patient denies this and reports she has a hisotry of PCOS for which she is taking Metformin 500mg  bid. She claims no HgbA1C or 1 hr GTT was done, and there were non in her records.  There was some reference to elevated fasting blood sugars but no baseline labs were done for her.  As a result, patient will get a 1 hr GTT on 04/17/12, unable to do it today due to the schedule.  She also complains of nausea, she is taking Zofran and Phenergan. Also reports right sided pain, early ultrasound showed 3 cm right lateral fibroid but pain is likely round ligament pain.  She has no other concerns.  Preterm labor precautions reviewed.   Patient to return in 2 days for 1 hr GTT, she will also be scheduled for anatomy scan soon and follow up for next prenatal visit.

## 2012-04-15 NOTE — Patient Instructions (Signed)
Return to clinic for any obstetric concerns or go to [facility] for evaluation  

## 2012-04-17 ENCOUNTER — Other Ambulatory Visit (INDEPENDENT_AMBULATORY_CARE_PROVIDER_SITE_OTHER): Payer: BC Managed Care – PPO | Admitting: *Deleted

## 2012-04-17 ENCOUNTER — Encounter: Payer: BC Managed Care – PPO | Admitting: Obstetrics & Gynecology

## 2012-04-17 DIAGNOSIS — E282 Polycystic ovarian syndrome: Secondary | ICD-10-CM

## 2012-04-17 NOTE — Progress Notes (Signed)
Patient is here today for a glucose tolerance test.

## 2012-04-18 ENCOUNTER — Ambulatory Visit (HOSPITAL_COMMUNITY)
Admission: RE | Admit: 2012-04-18 | Discharge: 2012-04-18 | Disposition: A | Discharge status: Home / Self Care | Payer: BC Managed Care – PPO | Source: Ambulatory Visit | Attending: Obstetrics & Gynecology | Admitting: Obstetrics & Gynecology

## 2012-04-18 DIAGNOSIS — Z3490 Encounter for supervision of normal pregnancy, unspecified, unspecified trimester: Secondary | ICD-10-CM

## 2012-04-18 DIAGNOSIS — Z363 Encounter for antenatal screening for malformations: Secondary | ICD-10-CM | POA: Insufficient documentation

## 2012-04-18 DIAGNOSIS — O358XX0 Maternal care for other (suspected) fetal abnormality and damage, not applicable or unspecified: Secondary | ICD-10-CM | POA: Insufficient documentation

## 2012-04-18 DIAGNOSIS — Z1389 Encounter for screening for other disorder: Secondary | ICD-10-CM | POA: Insufficient documentation

## 2012-04-18 LAB — GLUCOSE TOLERANCE, 1 HOUR (50G) W/O FASTING: Glucose, 1 Hour GTT: 136 mg/dL (ref 70–140)

## 2012-04-21 ENCOUNTER — Encounter: Payer: Self-pay | Admitting: Obstetrics & Gynecology

## 2012-04-21 DIAGNOSIS — D259 Leiomyoma of uterus, unspecified: Secondary | ICD-10-CM | POA: Insufficient documentation

## 2012-04-21 DIAGNOSIS — O3410 Maternal care for benign tumor of corpus uteri, unspecified trimester: Secondary | ICD-10-CM | POA: Insufficient documentation

## 2012-05-05 ENCOUNTER — Other Ambulatory Visit (INDEPENDENT_AMBULATORY_CARE_PROVIDER_SITE_OTHER): Payer: BC Managed Care – PPO | Admitting: Gynecology

## 2012-05-05 DIAGNOSIS — Z3400 Encounter for supervision of normal first pregnancy, unspecified trimester: Secondary | ICD-10-CM

## 2012-05-05 DIAGNOSIS — O99810 Abnormal glucose complicating pregnancy: Secondary | ICD-10-CM

## 2012-05-05 NOTE — Addendum Note (Signed)
Addended by: Vinnie Langton C on: 05/05/2012 10:17 AM   Modules accepted: Level of Service

## 2012-05-06 ENCOUNTER — Encounter: Payer: Self-pay | Admitting: Family Medicine

## 2012-05-06 LAB — GLUCOSE TOLERANCE, 3 HOURS
Glucose Tolerance, 1 hour: 167 mg/dL (ref 70–189)
Glucose Tolerance, 2 hour: 119 mg/dL (ref 70–164)
Glucose Tolerance, Fasting: 66 mg/dL — ABNORMAL LOW (ref 70–104)
Glucose, GTT - 3 Hour: 121 mg/dL (ref 70–144)

## 2012-05-08 ENCOUNTER — Ambulatory Visit (INDEPENDENT_AMBULATORY_CARE_PROVIDER_SITE_OTHER): Payer: BC Managed Care – PPO | Admitting: Obstetrics & Gynecology

## 2012-05-08 VITALS — BP 105/69 | Wt 212.0 lb

## 2012-05-08 DIAGNOSIS — O09529 Supervision of elderly multigravida, unspecified trimester: Secondary | ICD-10-CM

## 2012-05-08 DIAGNOSIS — IMO0002 Reserved for concepts with insufficient information to code with codable children: Secondary | ICD-10-CM

## 2012-05-08 DIAGNOSIS — Z3480 Encounter for supervision of other normal pregnancy, unspecified trimester: Secondary | ICD-10-CM

## 2012-05-08 NOTE — Progress Notes (Signed)
D/w pt glucose resutls.  Repeat 1 hr GCT at 28 weeks.  D/w pt mercury containing fish and protein options.  +FM, no ctx, no LOF.

## 2012-05-08 NOTE — Patient Instructions (Signed)
Pregnancy - Second Trimester The second trimester of pregnancy (3 to 6 months) is a period of rapid growth for you and your baby. At the end of the sixth month, your baby is about 9 inches long and weighs 1 1/2 pounds. You will begin to feel the baby move between 18 and 20 weeks of the pregnancy. This is called quickening. Weight gain is faster. A clear fluid (colostrum) may leak out of your breasts. You may feel small contractions of the womb (uterus). This is known as false labor or Braxton-Hicks contractions. This is like a practice for labor when the baby is ready to be born. Usually, the problems with morning sickness have usually passed by the end of your first trimester. Some women develop small dark blotches (called cholasma, mask of pregnancy) on their face that usually goes away after the baby is born. Exposure to the sun makes the blotches worse. Acne may also develop in some pregnant women and pregnant women who have acne, may find that it goes away. PRENATAL EXAMS  Blood work may continue to be done during prenatal exams. These tests are done to check on your health and the probable health of your baby. Blood work is used to follow your blood levels (hemoglobin). Anemia (low hemoglobin) is common during pregnancy. Iron and vitamins are given to help prevent this. You will also be checked for diabetes between 24 and 28 weeks of the pregnancy. Some of the previous blood tests may be repeated.  The size of the uterus is measured during each visit. This is to make sure that the baby is continuing to grow properly according to the dates of the pregnancy.  Your blood pressure is checked every prenatal visit. This is to make sure you are not getting toxemia.  Your urine is checked to make sure you do not have an infection, diabetes or protein in the urine.  Your weight is checked often to make sure gains are happening at the suggested rate. This is to ensure that both you and your baby are growing  normally.  Sometimes, an ultrasound is performed to confirm the proper growth and development of the baby. This is a test which bounces harmless sound waves off the baby so your caregiver can more accurately determine due dates. Sometimes, a specialized test is done on the amniotic fluid surrounding the baby. This test is called an amniocentesis. The amniotic fluid is obtained by sticking a needle into the belly (abdomen). This is done to check the chromosomes in instances where there is a concern about possible genetic problems with the baby. It is also sometimes done near the end of pregnancy if an early delivery is required. In this case, it is done to help make sure the baby's lungs are mature enough for the baby to live outside of the womb. CHANGES OCCURING IN THE SECOND TRIMESTER OF PREGNANCY Your body goes through many changes during pregnancy. They vary from person to person. Talk to your caregiver about changes you notice that you are concerned about.  During the second trimester, you will likely have an increase in your appetite. It is normal to have cravings for certain foods. This varies from person to person and pregnancy to pregnancy.  Your lower abdomen will begin to bulge.  You may have to urinate more often because the uterus and baby are pressing on your bladder. It is also common to get more bladder infections during pregnancy (pain with urination). You can help this by   drinking lots of fluids and emptying your bladder before and after intercourse.  You may begin to get stretch marks on your hips, abdomen, and breasts. These are normal changes in the body during pregnancy. There are no exercises or medications to take that prevent this change.  You may begin to develop swollen and bulging veins (varicose veins) in your legs. Wearing support hose, elevating your feet for 15 minutes, 3 to 4 times a day and limiting salt in your diet helps lessen the problem.  Heartburn may develop  as the uterus grows and pushes up against the stomach. Antacids recommended by your caregiver helps with this problem. Also, eating smaller meals 4 to 5 times a day helps.  Constipation can be treated with a stool softener or adding bulk to your diet. Drinking lots of fluids, vegetables, fruits, and whole grains are helpful.  Exercising is also helpful. If you have been very active up until your pregnancy, most of these activities can be continued during your pregnancy. If you have been less active, it is helpful to start an exercise program such as walking.  Hemorrhoids (varicose veins in the rectum) may develop at the end of the second trimester. Warm sitz baths and hemorrhoid cream recommended by your caregiver helps hemorrhoid problems.  Backaches may develop during this time of your pregnancy. Avoid heavy lifting, wear low heal shoes and practice good posture to help with backache problems.  Some pregnant women develop tingling and numbness of their hand and fingers because of swelling and tightening of ligaments in the wrist (carpel tunnel syndrome). This goes away after the baby is born.  As your breasts enlarge, you may have to get a bigger bra. Get a comfortable, cotton, support bra. Do not get a nursing bra until the last month of the pregnancy if you will be nursing the baby.  You may get a dark line from your belly button to the pubic area called the linea nigra.  You may develop rosy cheeks because of increase blood flow to the face.  You may develop spider looking lines of the face, neck, arms and chest. These go away after the baby is born. HOME CARE INSTRUCTIONS   It is extremely important to avoid all smoking, herbs, alcohol, and unprescribed drugs during your pregnancy. These chemicals affect the formation and growth of the baby. Avoid these chemicals throughout the pregnancy to ensure the delivery of a healthy infant.  Most of your home care instructions are the same as  suggested for the first trimester of your pregnancy. Keep your caregiver's appointments. Follow your caregiver's instructions regarding medication use, exercise and diet.  During pregnancy, you are providing food for you and your baby. Continue to eat regular, well-balanced meals. Choose foods such as meat, fish, milk and other low fat dairy products, vegetables, fruits, and whole-grain breads and cereals. Your caregiver will tell you of the ideal weight gain.  A physical sexual relationship may be continued up until near the end of pregnancy if there are no other problems. Problems could include early (premature) leaking of amniotic fluid from the membranes, vaginal bleeding, abdominal pain, or other medical or pregnancy problems.  Exercise regularly if there are no restrictions. Check with your caregiver if you are unsure of the safety of some of your exercises. The greatest weight gain will occur in the last 2 trimesters of pregnancy. Exercise will help you:  Control your weight.  Get you in shape for labor and delivery.  Lose weight   after you have the baby.  Wear a good support or jogging bra for breast tenderness during pregnancy. This may help if worn during sleep. Pads or tissues may be used in the bra if you are leaking colostrum.  Do not use hot tubs, steam rooms or saunas throughout the pregnancy.  Wear your seat belt at all times when driving. This protects you and your baby if you are in an accident.  Avoid raw meat, uncooked cheese, cat litter boxes and soil used by cats. These carry germs that can cause birth defects in the baby.  The second trimester is also a good time to visit your dentist for your dental health if this has not been done yet. Getting your teeth cleaned is OK. Use a soft toothbrush. Brush gently during pregnancy.  It is easier to loose urine during pregnancy. Tightening up and strengthening the pelvic muscles will help with this problem. Practice stopping your  urination while you are going to the bathroom. These are the same muscles you need to strengthen. It is also the muscles you would use as if you were trying to stop from passing gas. You can practice tightening these muscles up 10 times a set and repeating this about 3 times per day. Once you know what muscles to tighten up, do not perform these exercises during urination. It is more likely to contribute to an infection by backing up the urine.  Ask for help if you have financial, counseling or nutritional needs during pregnancy. Your caregiver will be able to offer counseling for these needs as well as refer you for other special needs.  Your skin may become oily. If so, wash your face with mild soap, use non-greasy moisturizer and oil or cream based makeup. MEDICATIONS AND DRUG USE IN PREGNANCY  Take prenatal vitamins as directed. The vitamin should contain 1 milligram of folic acid. Keep all vitamins out of reach of children. Only a couple vitamins or tablets containing iron may be fatal to a baby or young child when ingested.  Avoid use of all medications, including herbs, over-the-counter medications, not prescribed or suggested by your caregiver. Only take over-the-counter or prescription medicines for pain, discomfort, or fever as directed by your caregiver. Do not use aspirin.  Let your caregiver also know about herbs you may be using.  Alcohol is related to a number of birth defects. This includes fetal alcohol syndrome. All alcohol, in any form, should be avoided completely. Smoking will cause low birth rate and premature babies.  Street or illegal drugs are very harmful to the baby. They are absolutely forbidden. A baby born to an addicted mother will be addicted at birth. The baby will go through the same withdrawal an adult does. SEEK MEDICAL CARE IF:  You have any concerns or worries during your pregnancy. It is better to call with your questions if you feel they cannot wait, rather  than worry about them. SEEK IMMEDIATE MEDICAL CARE IF:   An unexplained oral temperature above 102 F (38.9 C) develops, or as your caregiver suggests.  You have leaking of fluid from the vagina (birth canal). If leaking membranes are suspected, take your temperature and tell your caregiver of this when you call.  There is vaginal spotting, bleeding, or passing clots. Tell your caregiver of the amount and how many pads are used. Light spotting in pregnancy is common, especially following intercourse.  You develop a bad smelling vaginal discharge with a change in the color from clear   to white.  You continue to feel sick to your stomach (nauseated) and have no relief from remedies suggested. You vomit blood or coffee ground-like materials.  You lose more than 2 pounds of weight or gain more than 2 pounds of weight over 1 week, or as suggested by your caregiver.  You notice swelling of your face, hands, feet, or legs.  You get exposed to German measles and have never had them.  You are exposed to fifth disease or chickenpox.  You develop belly (abdominal) pain. Round ligament discomfort is a common non-cancerous (benign) cause of abdominal pain in pregnancy. Your caregiver still must evaluate you.  You develop a bad headache that does not go away.  You develop fever, diarrhea, pain with urination, or shortness of breath.  You develop visual problems, blurry, or double vision.  You fall or are in a car accident or any kind of trauma.  There is mental or physical violence at home. Document Released: 07/03/2001 Document Revised: 10/01/2011 Document Reviewed: 01/05/2009 [employer] Patient Information 2013 [employer].  

## 2012-05-22 ENCOUNTER — Telehealth: Payer: Self-pay | Admitting: *Deleted

## 2012-05-22 ENCOUNTER — Ambulatory Visit (INDEPENDENT_AMBULATORY_CARE_PROVIDER_SITE_OTHER): Payer: BC Managed Care – PPO | Admitting: Obstetrics & Gynecology

## 2012-05-22 VITALS — BP 116/73 | Wt 216.0 lb

## 2012-05-22 DIAGNOSIS — Z3490 Encounter for supervision of normal pregnancy, unspecified, unspecified trimester: Secondary | ICD-10-CM

## 2012-05-22 DIAGNOSIS — M549 Dorsalgia, unspecified: Secondary | ICD-10-CM

## 2012-05-22 DIAGNOSIS — O09529 Supervision of elderly multigravida, unspecified trimester: Secondary | ICD-10-CM

## 2012-05-22 DIAGNOSIS — IMO0002 Reserved for concepts with insufficient information to code with codable children: Secondary | ICD-10-CM

## 2012-05-22 DIAGNOSIS — Z3480 Encounter for supervision of other normal pregnancy, unspecified trimester: Secondary | ICD-10-CM

## 2012-05-22 DIAGNOSIS — O26899 Other specified pregnancy related conditions, unspecified trimester: Secondary | ICD-10-CM

## 2012-05-22 LAB — POCT URINALYSIS DIPSTICK
Blood, UA: NEGATIVE
Nitrite, UA: NEGATIVE
Protein, UA: NEGATIVE
Spec Grav, UA: >=1.03
Urobilinogen, UA: NEGATIVE
pH, UA: 5

## 2012-05-22 NOTE — Patient Instructions (Signed)
Return to clinic for any obstetric concerns or go to [facility] for evaluation  

## 2012-05-22 NOTE — Telephone Encounter (Signed)
Patient called with increased cramping since Sunday that has not gone away.  She will come to office this morning to be checked out.

## 2012-05-22 NOTE — Progress Notes (Signed)
Feels a little dizzy today, no CP, SOB. Felt better when she laid down on exam table.  Cervix closed on exam.  3+ glucose in urine, will repeat GCT in a couple of weeks.  She was told to come to MAU with any worsening symptoms. No other complaints or concerns.  Fetal movement and labor precautions reviewed.  Growth scan ordered as per MFM recommendations.

## 2012-05-22 NOTE — Progress Notes (Signed)
Patient has been having increased cramping since Sunday.  She is also having some pain in her low back.  She is also having increased pelvic pressure.  Her urine dip is clear for infection.  She states she drinks a lot of water throughout the day.

## 2012-05-27 ENCOUNTER — Ambulatory Visit (HOSPITAL_COMMUNITY): Payer: BC Managed Care – PPO

## 2012-05-29 ENCOUNTER — Encounter (HOSPITAL_COMMUNITY): Payer: Self-pay

## 2012-05-29 ENCOUNTER — Ambulatory Visit (HOSPITAL_COMMUNITY)
Admission: RE | Admit: 2012-05-29 | Discharge: 2012-05-29 | Disposition: A | Discharge status: Home / Self Care | Payer: BC Managed Care – PPO | Source: Ambulatory Visit | Attending: Obstetrics & Gynecology | Admitting: Obstetrics & Gynecology

## 2012-05-29 ENCOUNTER — Ambulatory Visit (HOSPITAL_COMMUNITY): Payer: BC Managed Care – PPO

## 2012-05-29 VITALS — BP 112/71 | HR 93 | Wt 219.8 lb

## 2012-05-29 DIAGNOSIS — IMO0002 Reserved for concepts with insufficient information to code with codable children: Secondary | ICD-10-CM

## 2012-05-29 DIAGNOSIS — Z3490 Encounter for supervision of normal pregnancy, unspecified, unspecified trimester: Secondary | ICD-10-CM

## 2012-05-29 DIAGNOSIS — D259 Leiomyoma of uterus, unspecified: Secondary | ICD-10-CM

## 2012-05-29 DIAGNOSIS — O09519 Supervision of elderly primigravida, unspecified trimester: Secondary | ICD-10-CM | POA: Insufficient documentation

## 2012-05-29 NOTE — Progress Notes (Signed)
Priscilla King  was seen today for an ultrasound appointment.  See full report in [facility].  Alpha Gula, MD  Single IUP at 25 5/7 weeks Normal interval anatomy Fetal growth is appropriate (57th %tile) Normal amniotic fluid volume  Recommend follow up ultrasounds as clinically indicated

## 2012-06-05 ENCOUNTER — Ambulatory Visit (INDEPENDENT_AMBULATORY_CARE_PROVIDER_SITE_OTHER): Payer: BC Managed Care – PPO | Admitting: Obstetrics & Gynecology

## 2012-06-05 VITALS — BP 108/80 | Wt 221.0 lb

## 2012-06-05 DIAGNOSIS — Z3480 Encounter for supervision of other normal pregnancy, unspecified trimester: Secondary | ICD-10-CM

## 2012-06-05 DIAGNOSIS — O09529 Supervision of elderly multigravida, unspecified trimester: Secondary | ICD-10-CM

## 2012-06-05 DIAGNOSIS — Z3490 Encounter for supervision of normal pregnancy, unspecified, unspecified trimester: Secondary | ICD-10-CM

## 2012-06-05 DIAGNOSIS — Z23 Encounter for immunization: Secondary | ICD-10-CM

## 2012-06-05 DIAGNOSIS — IMO0002 Reserved for concepts with insufficient information to code with codable children: Secondary | ICD-10-CM

## 2012-06-05 LAB — CBC
MCH: 28.8 pg (ref 26.0–34.0)
MCHC: 33.4 g/dL (ref 30.0–36.0)
MCV: 86.2 fL (ref 78.0–100.0)
Platelets: 274 10*3/uL (ref 150–400)
RBC: 3.85 MIL/uL — ABNORMAL LOW (ref 3.87–5.11)
RDW: 12.7 % (ref 11.5–15.5)

## 2012-06-05 MED ORDER — TETANUS-DIPHTH-ACELL PERTUSSIS 5-2.5-18.5 LF-MCG/0.5 IM SUSP: 0.5000 mL | Freq: Once | INTRAMUSCULAR | Status: DC

## 2012-06-05 MED ORDER — INFLUENZA VIRUS VACC SPLIT PF IM SUSP: 0.5000 mL | Freq: Once | INTRAMUSCULAR | Status: DC

## 2012-06-05 NOTE — Progress Notes (Signed)
Recommended Pepcid for reflux, Benadryl for sleep. Normal exam in bilateral legs.  Repeat 1 hr GTT today and third trimester labs.  Flu and TDaP vaccines today.  No other complaints or concerns.  Fetal movement and labor precautions reviewed.

## 2012-06-05 NOTE — Patient Instructions (Signed)
Return to clinic for any obstetric concerns or go to [facility] for evaluation  

## 2012-06-05 NOTE — Progress Notes (Signed)
Doing 1hr GTT today.  Nausea is back, unable to sleep, pain to touch in left leg x 4 weeks.

## 2012-06-06 ENCOUNTER — Encounter: Payer: Self-pay | Admitting: Obstetrics & Gynecology

## 2012-06-06 DIAGNOSIS — O24419 Gestational diabetes mellitus in pregnancy, unspecified control: Secondary | ICD-10-CM | POA: Insufficient documentation

## 2012-06-06 LAB — GLUCOSE TOLERANCE, 1 HOUR (50G) W/O FASTING: Glucose, 1 Hour GTT: 187 mg/dL — ABNORMAL HIGH (ref 70–140)

## 2012-06-10 ENCOUNTER — Telehealth: Payer: Self-pay | Admitting: *Deleted

## 2012-06-10 NOTE — Telephone Encounter (Signed)
Patient refused 3 hr gtt and wishes to move forward with gestational diabetes protocol.  She has already seen diabetic counsel at [facility] and has a meter.  She will check fasting and 2 hr pp for each meal and bring these results to her next visit.

## 2012-06-13 ENCOUNTER — Inpatient Hospital Stay (HOSPITAL_COMMUNITY): Payer: BC Managed Care – PPO

## 2012-06-13 ENCOUNTER — Encounter (HOSPITAL_COMMUNITY): Payer: Self-pay | Admitting: *Deleted

## 2012-06-13 ENCOUNTER — Inpatient Hospital Stay (HOSPITAL_COMMUNITY)
Admission: AD | Admit: 2012-06-13 | Discharge: 2012-06-13 | Disposition: A | Discharge status: Home / Self Care | Payer: BC Managed Care – PPO | Source: Ambulatory Visit | Attending: Obstetrics & Gynecology | Admitting: Obstetrics & Gynecology

## 2012-06-13 DIAGNOSIS — D259 Leiomyoma of uterus, unspecified: Secondary | ICD-10-CM

## 2012-06-13 DIAGNOSIS — R109 Unspecified abdominal pain: Secondary | ICD-10-CM | POA: Insufficient documentation

## 2012-06-13 DIAGNOSIS — O9A219 Injury, poisoning and certain other consequences of external causes complicating pregnancy, unspecified trimester: Secondary | ICD-10-CM

## 2012-06-13 DIAGNOSIS — IMO0002 Reserved for concepts with insufficient information to code with codable children: Secondary | ICD-10-CM

## 2012-06-13 DIAGNOSIS — Z3490 Encounter for supervision of normal pregnancy, unspecified, unspecified trimester: Secondary | ICD-10-CM

## 2012-06-13 DIAGNOSIS — M545 Low back pain, unspecified: Secondary | ICD-10-CM | POA: Insufficient documentation

## 2012-06-13 DIAGNOSIS — W108XXA Fall (on) (from) other stairs and steps, initial encounter: Secondary | ICD-10-CM | POA: Insufficient documentation

## 2012-06-13 DIAGNOSIS — O99891 Other specified diseases and conditions complicating pregnancy: Secondary | ICD-10-CM | POA: Insufficient documentation

## 2012-06-13 DIAGNOSIS — O99810 Abnormal glucose complicating pregnancy: Secondary | ICD-10-CM | POA: Insufficient documentation

## 2012-06-13 LAB — WET PREP, GENITAL: Yeast Wet Prep HPF POC: NONE SEEN

## 2012-06-13 LAB — URINALYSIS, ROUTINE W REFLEX MICROSCOPIC
Ketones, ur: NEGATIVE mg/dL
Leukocytes, UA: NEGATIVE
Nitrite: NEGATIVE
pH: 6 (ref 5.0–8.0)

## 2012-06-13 NOTE — MAU Note (Signed)
Pt states fell around 0715 yesterday am, slid down stairs, hit bottom only, denies bleeding or vag d/c changes. +FM, feeling bilateral lower abd cramping and pain moreso on right side

## 2012-06-13 NOTE — MAU Provider Note (Signed)
  History     CSN: [id number]  Arrival date and time: 06/13/12 1751   None     Chief Complaint  Patient presents with  . Fall   HPI  SEE NOTE WRITTEN BY PAM FERRY, MD. Unable to edit her note at this time.   Past Medical History  Diagnosis Date  . ESR raised     elevated  . Achilles tendinosis     B, calcific tendonitis  . 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 History of polycystic ovaries   . Elevated blood sugar     History reviewed. No pertinent past surgical history.  Family History  Problem Relation Age of Onset  . Stroke Mother     CVA (TIA)  . Diabetes Other     History  Substance Use Topics  . Smoking status: Never Smoker   . Smokeless tobacco: Never Used  . Alcohol Use: No    Allergies:  Allergies  Allergen Reactions  . Vicodin (Hydrocodone-Acetaminophen) Swelling    Tongue swelling and headache    Prescriptions prior to admission  Medication Sig Dispense Refill  . acetaminophen (TYLENOL) 325 MG tablet Take 650 mg by mouth as needed. For pain      . dextromethorphan 15 MG/5ML syrup Take 10 mLs by mouth 4 (four) times daily as needed. For cough      . famotidine (PEPCID) 10 MG tablet Take 10 mg by mouth 2 (two) times daily as needed. For heartburn      . ondansetron (ZOFRAN) 8 MG tablet Take 8 mg by mouth every 8 (eight) hours as needed. For nausea      . promethazine (PHENERGAN) 25 MG tablet Take 25 mg by mouth every 6 (six) hours as needed. For nausea        ROS Physical Exam   Blood pressure 118/65, pulse 97, temperature 98.1 F (36.7 C), temperature source Oral, resp. rate 18, height 5\' 8"  (1.727 m), weight 100.415 kg (221 lb 6 oz), last menstrual period 12/01/2011.  Physical Exam  MAU Course  Procedures *RADIOLOGY REPORT*  Clinical Data: Pain after trauma from fall. Fibroids. Assigned  gestational age is 27 weeks 6 days.  LIMITED OBSTETRIC ULTRASOUND  Number of Fetuses: 1  Heart Rate: 152 bpm  Movement: Fetal movement  is observed.  Presentation: Fetus is in cephalic presentation.  Placental Location: Placenta is anterior and is not low-lying.  Previa: No previa. No abruption.  Amniotic Fluid (Subjective): Normal.  Vertical pocket: 9.1cm  BPD: 7.49cm 30w 1d EDC: 08/21/2012  MATERNAL FINDINGS:  Cervix: Cervix measures 3.1 cm length. Cervix appears closed.  Uterus/Adnexae: Both ovaries are visualized and appear symmetrical.  Limited visualization of the uterus. Small hypoechoic nodules are  demonstrated suggesting small fibroids.  IMPRESSION:  Single intrauterine gestation demonstrated. No specific  complication identified.  Recommend followup with non-emergent complete OB 14+ wk US  examination for fetal biometric evaluation and anatomic survey if  not already performed.  Original Report Authenticated By: Burman Nieves, M.D.   Assessment and Plan  Fall in pregnancy Fibroid uterus FU with PCP as scheduled.   Tawnya Crook 06/13/2012, 9:37 PM

## 2012-06-13 NOTE — MAU Provider Note (Signed)
History     CSN: [id number]  Arrival date and time: 06/13/12 1751   None     Chief Complaint  Patient presents with  . Fall   HPI 36 y.o. G2P0010 at [redacted]w[redacted]d with back pain and cramping. Slid down stairs yesterday, 4-5 steps - did not fall or land on bottom or stomach -braced herself with arms. Lower back hurt immediately after this but able to sleep. Started cramping today, like period. No bleeding, no loss of fluid. Normal fetal movement. No dysuria, no pressure.  Back pain today is 7-8/10 feels like screw driver in back in one spot (lower thoracic/upper lumbar). She has had some back pain throughout pregnancy in this spot. Lower abdominal cramping is 5/10 at times, comes and goes. PNV at [facility], last visit last week. Recently diagnosed GDM, checking sugars but not taking medication yet. Fibroid uterus.  OB History    Grav Para Term Preterm Abortions TAB SAB Ect Mult Living   2    1 1           Past Medical History  Diagnosis Date  . ESR raised     elevated  . Achilles tendinosis     B, calcific tendonitis  . 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 History of polycystic ovaries   . Elevated blood sugar   Keinbock's disease x 10 years.   History reviewed. No pertinent past surgical history.  Family History  Problem Relation Age of Onset  . Stroke Mother     CVA (TIA)  . Diabetes Other     History  Substance Use Topics  . Smoking status: Never Smoker   . Smokeless tobacco: Never Used  . Alcohol Use: No    Allergies:  Allergies  Allergen Reactions  . Vicodin (Hydrocodone-Acetaminophen) Swelling    Tongue swelling and headache    Prescriptions prior to admission  Medication Sig Dispense Refill  . acetaminophen (TYLENOL) 325 MG tablet Take 650 mg by mouth as needed. For pain      . dextromethorphan 15 MG/5ML syrup Take 10 mLs by mouth 4 (four) times daily as needed. For cough      . famotidine (PEPCID) 10 MG tablet Take 10 mg by mouth 2 (two)  times daily as needed. For heartburn      . ondansetron (ZOFRAN) 8 MG tablet Take 8 mg by mouth every 8 (eight) hours as needed. For nausea      . promethazine (PHENERGAN) 25 MG tablet Take 25 mg by mouth every 6 (six) hours as needed. For nausea        Review of Systems  Constitutional: Negative for fever and chills.  Eyes: Negative for blurred vision and double vision.  Respiratory: Negative for shortness of breath.   Cardiovascular: Negative for chest pain.  Gastrointestinal: Negative for nausea, vomiting and abdominal pain.  Genitourinary: Negative for dysuria and urgency.  Musculoskeletal: Positive for back pain.  Neurological: Negative for dizziness and headaches.     Physical Exam   Blood pressure 118/65, pulse 97, temperature 98.1 F (36.7 C), temperature source Oral, resp. rate 18, height 5\' 8"  (1.727 m), weight 100.415 kg (221 lb 6 oz), last menstrual period 12/01/2011.  Physical Exam  Constitutional: She is oriented to person, place, and time. She appears well-developed and well-nourished. No distress.  HENT:  Head: Normocephalic and atraumatic.  Eyes: Conjunctivae normal and EOM are normal.  Neck: Normal range of motion. Neck supple.  Cardiovascular: Normal rate.  Respiratory: Effort normal. No respiratory distress.  GI: Soft. There is no tenderness. There is no rebound and no guarding.  Genitourinary:       Normal external genitalia. Norma vagina. Moderate thick white discharge, no odor. No CMT, Cervix closed visually and on exam.   Musculoskeletal: Normal range of motion. She exhibits no edema and no tenderness.  Neurological: She is alert and oriented to person, place, and time.  Skin: Skin is warm and dry.  Psychiatric: She has a normal mood and affect.   Results for orders placed during the hospital encounter of 06/13/12 (from the past 24 hour(s))  URINALYSIS, ROUTINE W REFLEX MICROSCOPIC     Status: Abnormal   Collection Time   06/13/12  7:30 PM       Component Value Range   Color, Urine YELLOW  YELLOW   APPearance CLEAR  CLEAR   Specific Gravity, Urine >1.030 (*) 1.005 - 1.030   pH 6.0  5.0 - 8.0   Glucose, UA NEGATIVE  NEGATIVE mg/dL   Hgb urine dipstick NEGATIVE  NEGATIVE   Bilirubin Urine NEGATIVE  NEGATIVE   Ketones, ur NEGATIVE  NEGATIVE mg/dL   Protein, ur NEGATIVE  NEGATIVE mg/dL   Urobilinogen, UA 0.2  0.0 - 1.0 mg/dL   Nitrite NEGATIVE  NEGATIVE   Leukocytes, UA NEGATIVE  NEGATIVE  WET PREP, GENITAL     Status: Abnormal   Collection Time   06/13/12  7:50 PM      Component Value Range   Yeast Wet Prep HPF POC NONE SEEN  NONE SEEN   Trich, Wet Prep NONE SEEN  NONE SEEN   Clue Cells Wet Prep HPF POC NONE SEEN  NONE SEEN   WBC, Wet Prep HPF POC FEW (*) NONE SEEN    MAU Course  Procedures    Assessment and Plan  36 y.o. G2P0010 at [redacted]w[redacted]d with near-fall > 24 hours ago, back pain and cramping. - Declines tylenol or other medication for pain - Ultrasound ordered to evaluate cervix and placenta  Care transferred to Thressa Sheller, CNM at 8:00 PM [place] Form 06/13/2012, 8:48 PM

## 2012-06-13 NOTE — MAU Provider Note (Signed)
Attestation of Attending Supervision of Advanced Practitioner (CNM/NP): Evaluation and management procedures were performed by the Advanced Practitioner under my supervision and collaboration.  I have reviewed the Advanced Practitioner's note and chart, and I agree with the management and plan.  Piercen Covino, MD, FACOG Attending Obstetrician & Gynecologist Faculty Practice, [facility]  

## 2012-06-18 ENCOUNTER — Encounter: Payer: Self-pay | Admitting: Obstetrics and Gynecology

## 2012-06-18 ENCOUNTER — Ambulatory Visit (INDEPENDENT_AMBULATORY_CARE_PROVIDER_SITE_OTHER): Payer: BC Managed Care – PPO | Admitting: Obstetrics and Gynecology

## 2012-06-18 VITALS — BP 129/73 | Wt 223.0 lb

## 2012-06-18 DIAGNOSIS — O09529 Supervision of elderly multigravida, unspecified trimester: Secondary | ICD-10-CM

## 2012-06-18 DIAGNOSIS — O3410 Maternal care for benign tumor of corpus uteri, unspecified trimester: Secondary | ICD-10-CM

## 2012-06-18 DIAGNOSIS — Z3480 Encounter for supervision of other normal pregnancy, unspecified trimester: Secondary | ICD-10-CM

## 2012-06-18 DIAGNOSIS — IMO0002 Reserved for concepts with insufficient information to code with codable children: Secondary | ICD-10-CM

## 2012-06-18 DIAGNOSIS — D259 Leiomyoma of uterus, unspecified: Secondary | ICD-10-CM

## 2012-06-18 DIAGNOSIS — O24419 Gestational diabetes mellitus in pregnancy, unspecified control: Secondary | ICD-10-CM

## 2012-06-18 DIAGNOSIS — O99810 Abnormal glucose complicating pregnancy: Secondary | ICD-10-CM

## 2012-06-18 DIAGNOSIS — Z3490 Encounter for supervision of normal pregnancy, unspecified, unspecified trimester: Secondary | ICD-10-CM

## 2012-06-18 NOTE — Progress Notes (Signed)
Patient doing well today. She started monitoring CBG on 11/25 and did not bring log book. Reports highest postprandial 160. She is not checking fasting as she eats at 2 am. Diabetic diet reviewed. Discussed consuming a protein rich snack at bedtime to avoid mid-night snacking. Advised patient to take pepcid BID rather than prn as she reports severe reflex that forces her to sleep in a sitting position. FM/PTL precautions reviewed. Patient to bring log book at next visit

## 2012-06-18 NOTE — Progress Notes (Signed)
Pain in left leg. Pain right side/

## 2012-07-01 ENCOUNTER — Ambulatory Visit (INDEPENDENT_AMBULATORY_CARE_PROVIDER_SITE_OTHER): Payer: BC Managed Care – PPO | Admitting: Obstetrics & Gynecology

## 2012-07-01 VITALS — BP 107/65 | Wt 225.0 lb

## 2012-07-01 DIAGNOSIS — IMO0002 Reserved for concepts with insufficient information to code with codable children: Secondary | ICD-10-CM

## 2012-07-01 DIAGNOSIS — Z3480 Encounter for supervision of other normal pregnancy, unspecified trimester: Secondary | ICD-10-CM

## 2012-07-01 DIAGNOSIS — O99810 Abnormal glucose complicating pregnancy: Secondary | ICD-10-CM

## 2012-07-01 DIAGNOSIS — O24419 Gestational diabetes mellitus in pregnancy, unspecified control: Secondary | ICD-10-CM

## 2012-07-01 DIAGNOSIS — O09529 Supervision of elderly multigravida, unspecified trimester: Secondary | ICD-10-CM

## 2012-07-01 MED ORDER — GLYBURIDE 2.5 MG PO TABS: 5.0000 mg | ORAL_TABLET | Freq: Two times a day (BID) | ORAL | Status: DC

## 2012-07-01 NOTE — Patient Instructions (Signed)
Gestational Diabetes Mellitus Gestational diabetes mellitus (GDM) is diabetes that occurs only during pregnancy. This happens when the body cannot properly handle the glucose (sugar) that increases in the blood after eating. During pregnancy, insulin resistance (reduced sensitivity to insulin) occurs because of the release of hormones from the placenta. Usually, the pancreas of pregnant women produces enough insulin to overcome the resistance that occurs. However, in gestational diabetes, the insulin is there but it does not work effectively. If the resistance is severe enough that the pancreas does not produce enough insulin, extra glucose builds up in the blood.  WHO IS AT RISK FOR DEVELOPING GESTATIONAL DIABETES?  Women with a history of diabetes in the family.  Women over age 25.  Women who are overweight.  Women in certain ethnic groups (Hispanic, African American, Native American, Asian and Pacific Islander). WHAT CAN HAPPEN TO THE BABY? If the mother's blood glucose is too high while she is pregnant, the extra sugar will travel through the umbilical cord to the baby. Some of the problems the baby may have are:  Large Baby - If the baby receives too much sugar, the baby will gain more weight. This may cause the baby to be too large to be born normally (vaginally) and a Cesarean section (C-section) may be needed.  Low Blood Glucose (hypoglycemia)  The baby makes extra insulin, in response to the extra sugar its gets from its mother. When the baby is born and no longer needs this extra insulin, the baby's blood glucose level may drop.  Jaundice (yellow coloring of the skin and eyes)  This is fairly common in babies. It is caused from a build-up of the chemical called bilirubin. This is rarely serious, but is seen more often in babies whose mothers had gestational diabetes. RISKS TO THE MOTHER Women who have had gestational diabetes may be at higher risk for some problems,  including:  Preeclampsia or toxemia, which includes problems with high blood pressure. Blood pressure and protein levels in the urine must be checked frequently.  Infections.  Cesarean section (C-section) for delivery.  Developing Type 2 diabetes later in life. About 30-50% will develop diabetes later, especially if obese. DIAGNOSIS  The hormones that cause insulin resistance are highest at about 24-28 weeks of pregnancy. If symptoms are experienced, they are much like symptoms you would normally expect during pregnancy.  GDM is often diagnosed using a two part method: 1. After 24-28 weeks of pregnancy, the woman drinks a glucose solution and takes a blood test. If the glucose level is high, a second test will be given. 2. Oral Glucose Tolerance Test (OGTT) which is 3 hours long  After not eating overnight, the blood glucose is checked. The woman drinks a glucose solution, and hourly blood glucose tests are taken. If the woman has risk factors for GDM, the caregiver may test earlier than 24 weeks of pregnancy. TREATMENT  Treatment of GDM is directed at keeping the mother's blood glucose level normal, and may include:  Meal planning.  Taking insulin or other medicine to control your blood glucose level.  Exercise.  Keeping a daily record of the foods you eat.  Blood glucose monitoring and keeping a record of your blood glucose levels.  May monitor ketone levels in the urine, although this is no longer considered necessary in most pregnancies. HOME CARE INSTRUCTIONS  While you are pregnant:  Follow your caregiver's advice regarding your prenatal appointments, meal planning, exercise, medicines, vitamins, blood and other tests, and physical   activities.  Keep a record of your meals, blood glucose tests, and the amount of insulin you are taking (if any). Show this to your caregiver at every prenatal visit.  If you have GDM, you may have problems with hypoglycemia (low blood glucose).  You may suspect this if you become suddenly dizzy, feel shaky, and/or weak. If you think this is happening and you have a glucose meter, try to test your blood glucose level. Follow your caregiver's advice for when and how to treat your low blood glucose. Generally, the 15:15 rule is followed: Treat by consuming 15 grams of carbohydrates, wait 15 minutes, and recheck blood glucose. Examples of 15 grams of carbohydrates are:  1 cup skim or low-fat milk.   cup juice.  3-4 glucose tablets.  5-6 hard candies.  1 small box raisins.   cup regular soda pop.  Practice good hygiene, to avoid infections.  Do not smoke. SEEK MEDICAL CARE IF:   You develop abnormal vaginal discharge, with or without itching.  You become weak and tired more than expected.  You seem to sweat a lot.  You have a sudden increase in weight, 5 pounds or more in one week.  You are losing weight, 3 pounds or more in a week.  Your blood glucose level is high, and you need instructions on what to do about it. SEEK IMMEDIATE MEDICAL CARE IF:   You develop a severe headache.  You faint or pass out.  You develop nausea and vomiting.  You become disoriented or confused.  You have a convulsion.  You develop vision problems.  You develop stomach pain.  You develop vaginal bleeding.  You develop uterine contractions.  You have leaking or a gush of fluid from the vagina. AFTER YOU HAVE THE BABY:  Go to all of your follow-up appointments, and have blood tests as advised by your caregiver.  Maintain a healthy lifestyle, to prevent diabetes in the future. This includes:  Following a healthy meal plan.  Controlling your weight.  Getting enough exercise and proper rest.  Do not smoke.  Breastfeed your baby if you can. This will lower the chance of you and your baby developing diabetes later in life. For more information about diabetes, go to the American Diabetes Association at:  www.americandiabetesassociation.org. For more information about gestational diabetes, go to the American Congress of Obstetricians and Gynecologists at: www.acog.org. Document Released: 10/15/2000 Document Revised: 10/01/2011 Document Reviewed: 05/09/2009 Exit[employer] Patient Information 2013 [employer].  

## 2012-07-01 NOTE — Progress Notes (Signed)
Fasting 103-115; 2hr PP B 112-158; L 103-129, 135; D 138-149, 191 . Started on Glyburide 5 mg po bid, hypoglycemia precautions given.  Will reevaluate CBGs next week; encouraged to continue diabetic diet.  Patient was told that since she now has medication-controlled GDM; she will start 2X/week fetal testing at 32 weeks, and will be induced at 39 weeks if there is no spontaneous labor.   If CBGs need further control, will consider adding Metformin to her regimen.  No other complaints or concerns.  Fetal movement and labor precautions reviewed.

## 2012-07-02 ENCOUNTER — Telehealth: Payer: Self-pay | Admitting: *Deleted

## 2012-07-02 DIAGNOSIS — O24419 Gestational diabetes mellitus in pregnancy, unspecified control: Secondary | ICD-10-CM

## 2012-07-02 MED ORDER — GLYBURIDE 2.5 MG PO TABS: 2.5000 mg | ORAL_TABLET | Freq: Two times a day (BID) | ORAL | Status: DC

## 2012-07-02 NOTE — Addendum Note (Signed)
Addended by: Jaynie Collins A on: 07/02/2012 02:49 PM   Modules accepted: Orders

## 2012-07-02 NOTE — Telephone Encounter (Signed)
Patient calling to say that she took her new medication and this morning her two hour post prandial blood sugar was 60.  She wanted to be sure this was ok as you had instructed her to watch the very low numbers as well and wanted to be sure she did not need to adjust her dose.

## 2012-07-02 NOTE — Telephone Encounter (Signed)
Patient was called at home and told to decrease dose to 2.5 mg po bid. Will evaluate CBGs at next visit. Hypoglycemia precautions given. Patient verbalized understanding of plan

## 2012-07-10 ENCOUNTER — Ambulatory Visit (INDEPENDENT_AMBULATORY_CARE_PROVIDER_SITE_OTHER): Payer: BC Managed Care – PPO | Admitting: Obstetrics & Gynecology

## 2012-07-10 VITALS — BP 102/72 | Wt 223.0 lb

## 2012-07-10 DIAGNOSIS — M62838 Other muscle spasm: Secondary | ICD-10-CM

## 2012-07-10 DIAGNOSIS — O24419 Gestational diabetes mellitus in pregnancy, unspecified control: Secondary | ICD-10-CM

## 2012-07-10 DIAGNOSIS — O0990 Supervision of high risk pregnancy, unspecified, unspecified trimester: Secondary | ICD-10-CM

## 2012-07-10 DIAGNOSIS — O99810 Abnormal glucose complicating pregnancy: Secondary | ICD-10-CM

## 2012-07-10 DIAGNOSIS — O09529 Supervision of elderly multigravida, unspecified trimester: Secondary | ICD-10-CM

## 2012-07-10 DIAGNOSIS — IMO0002 Reserved for concepts with insufficient information to code with codable children: Secondary | ICD-10-CM

## 2012-07-10 MED ORDER — CYCLOBENZAPRINE HCL 10 MG PO TABS: 10.0000 mg | ORAL_TABLET | Freq: Three times a day (TID) | ORAL | Status: DC | PRN

## 2012-07-10 NOTE — Progress Notes (Signed)
Patient did not tolerate glyburide, has diffuse body itching and hypoglycemic episodes to 60s even after she took half the dose. She has not taken any medications for over 5 days. She reports always feeling "low" in the middle of the night and eating then, so her fasting blood sugars are not accurate as they are taken about 2 hours after her overnight meal.  "Fasting CBG" were 131, 134, rest of postprandials were normal, only one value in 130s.  She was told to check her CBG in the middle of the night before she eats, continue diabetic diet for now. Will evaluate if she needs any medication next week.  Patient also has increased fundal height, significant from last week. She feels the baby changed his position, wants a recheck next week. If still significantly elevated next week, obtain ultrasound, appointment scheduled in 4 days.  Patient also reports right lower abdominal pain that has been constant for months, sometimes sharp. No other associated symptoms. Not helped with Tylenol, she is allergic to codeine/hydrocodone and is wary of narcotics. Will do a trial of Flexeril to see if this helps with her symptoms.  No other complaints or concerns.  Fetal movement and labor precautions reviewed.    Flexeil

## 2012-07-10 NOTE — Patient Instructions (Signed)
Return to clinic for any obstetric concerns or go to [facility] for evaluation  

## 2012-07-14 ENCOUNTER — Ambulatory Visit (INDEPENDENT_AMBULATORY_CARE_PROVIDER_SITE_OTHER): Payer: BC Managed Care – PPO | Admitting: Family Medicine

## 2012-07-14 VITALS — BP 97/65 | Wt 225.0 lb

## 2012-07-14 DIAGNOSIS — O24419 Gestational diabetes mellitus in pregnancy, unspecified control: Secondary | ICD-10-CM

## 2012-07-14 DIAGNOSIS — O99810 Abnormal glucose complicating pregnancy: Secondary | ICD-10-CM

## 2012-07-14 DIAGNOSIS — Z3480 Encounter for supervision of other normal pregnancy, unspecified trimester: Secondary | ICD-10-CM

## 2012-07-14 NOTE — Progress Notes (Signed)
BS ok--for measurement today--which is good.   A1 DM--for now.

## 2012-07-14 NOTE — Patient Instructions (Signed)
Gestational Diabetes Mellitus Gestational diabetes mellitus (GDM) is diabetes that occurs only during pregnancy. This happens when the body cannot properly handle the glucose (sugar) that increases in the blood after eating. During pregnancy, insulin resistance (reduced sensitivity to insulin) occurs because of the release of hormones from the placenta. Usually, the pancreas of pregnant women produces enough insulin to overcome the resistance that occurs. However, in gestational diabetes, the insulin is there but it does not work effectively. If the resistance is severe enough that the pancreas does not produce enough insulin, extra glucose builds up in the blood.  WHO IS AT RISK FOR DEVELOPING GESTATIONAL DIABETES?  Women with a history of diabetes in the family.  Women over age 25.  Women who are overweight.  Women in certain ethnic groups (Hispanic, African American, Native American, Asian and Pacific Islander). WHAT CAN HAPPEN TO THE BABY? If the mother's blood glucose is too high while she is pregnant, the extra sugar will travel through the umbilical cord to the baby. Some of the problems the baby may have are:  Large Baby - If the baby receives too much sugar, the baby will gain more weight. This may cause the baby to be too large to be born normally (vaginally) and a Cesarean section (C-section) may be needed.  Low Blood Glucose (hypoglycemia)  The baby makes extra insulin, in response to the extra sugar its gets from its mother. When the baby is born and no longer needs this extra insulin, the baby's blood glucose level may drop.  Jaundice (yellow coloring of the skin and eyes)  This is fairly common in babies. It is caused from a build-up of the chemical called bilirubin. This is rarely serious, but is seen more often in babies whose mothers had gestational diabetes. RISKS TO THE MOTHER Women who have had gestational diabetes may be at higher risk for some problems,  including:  Preeclampsia or toxemia, which includes problems with high blood pressure. Blood pressure and protein levels in the urine must be checked frequently.  Infections.  Cesarean section (C-section) for delivery.  Developing Type 2 diabetes later in life. About 30-50% will develop diabetes later, especially if obese. DIAGNOSIS  The hormones that cause insulin resistance are highest at about 24-28 weeks of pregnancy. If symptoms are experienced, they are much like symptoms you would normally expect during pregnancy.  GDM is often diagnosed using a two part method: 1. After 24-28 weeks of pregnancy, the woman drinks a glucose solution and takes a blood test. If the glucose level is high, a second test will be given. 2. Oral Glucose Tolerance Test (OGTT) which is 3 hours long  After not eating overnight, the blood glucose is checked. The woman drinks a glucose solution, and hourly blood glucose tests are taken. If the woman has risk factors for GDM, the caregiver may test earlier than 24 weeks of pregnancy. TREATMENT  Treatment of GDM is directed at keeping the mother's blood glucose level normal, and may include:  Meal planning.  Taking insulin or other medicine to control your blood glucose level.  Exercise.  Keeping a daily record of the foods you eat.  Blood glucose monitoring and keeping a record of your blood glucose levels.  May monitor ketone levels in the urine, although this is no longer considered necessary in most pregnancies. HOME CARE INSTRUCTIONS  While you are pregnant:  Follow your caregiver's advice regarding your prenatal appointments, meal planning, exercise, medicines, vitamins, blood and other tests, and physical   activities.  Keep a record of your meals, blood glucose tests, and the amount of insulin you are taking (if any). Show this to your caregiver at every prenatal visit.  If you have GDM, you may have problems with hypoglycemia (low blood glucose).  You may suspect this if you become suddenly dizzy, feel shaky, and/or weak. If you think this is happening and you have a glucose meter, try to test your blood glucose level. Follow your caregiver's advice for when and how to treat your low blood glucose. Generally, the 15:15 rule is followed: Treat by consuming 15 grams of carbohydrates, wait 15 minutes, and recheck blood glucose. Examples of 15 grams of carbohydrates are:  1 cup skim or low-fat milk.   cup juice.  3-4 glucose tablets.  5-6 hard candies.  1 small box raisins.   cup regular soda pop.  Practice good hygiene, to avoid infections.  Do not smoke. SEEK MEDICAL CARE IF:   You develop abnormal vaginal discharge, with or without itching.  You become weak and tired more than expected.  You seem to sweat a lot.  You have a sudden increase in weight, 5 pounds or more in one week.  You are losing weight, 3 pounds or more in a week.  Your blood glucose level is high, and you need instructions on what to do about it. SEEK IMMEDIATE MEDICAL CARE IF:   You develop a severe headache.  You faint or pass out.  You develop nausea and vomiting.  You become disoriented or confused.  You have a convulsion.  You develop vision problems.  You develop stomach pain.  You develop vaginal bleeding.  You develop uterine contractions.  You have leaking or a gush of fluid from the vagina. AFTER YOU HAVE THE BABY:  Go to all of your follow-up appointments, and have blood tests as advised by your caregiver.  Maintain a healthy lifestyle, to prevent diabetes in the future. This includes:  Following a healthy meal plan.  Controlling your weight.  Getting enough exercise and proper rest.  Do not smoke.  Breastfeed your baby if you can. This will lower the chance of you and your baby developing diabetes later in life. For more information about diabetes, go to the American Diabetes Association at:  www.americandiabetesassociation.org. For more information about gestational diabetes, go to the American Congress of Obstetricians and Gynecologists at: www.acog.org. Document Released: 10/15/2000 Document Revised: 10/01/2011 Document Reviewed: 05/09/2009 ExitCare Patient Information 2013 [employer].  Breastfeeding Deciding to breastfeed is one of the best choices you can make for you and your baby. The information that follows gives a brief overview of the benefits of breastfeeding as well as common topics surrounding breastfeeding. BENEFITS OF BREASTFEEDING For the baby  The first milk (colostrum) helps the baby's digestive system function better.   There are antibodies in the mother's milk that help the baby fight off infections.   The baby has a lower incidence of asthma, allergies, and sudden infant death syndrome (SIDS).   The nutrients in breast milk are better for the baby than infant formulas, and breast milk helps the baby's brain grow better.   Babies who breastfeed have less gas, colic, and constipation.  For the mother  Breastfeeding helps develop a very special bond between the mother and her baby.   Breastfeeding is convenient, always available at the correct temperature, and costs nothing.   Breastfeeding burns calories in the mother and helps her lose weight that was gained during pregnancy.     Breastfeeding makes the uterus contract back down to normal size faster and slows bleeding following delivery.   Breastfeeding mothers have a lower risk of developing breast cancer.  BREASTFEEDING FREQUENCY  A healthy, full-term baby may breastfeed as often as every hour or space his or her feedings to every 3 hours.   Watch your baby for signs of hunger. Nurse your baby if he or she shows signs of hunger. How often you nurse will vary from baby to baby.   Nurse as often as the baby requests, or when you feel the need to reduce the fullness of your breasts.    Awaken the baby if it has been 3 4 hours since the last feeding.   Frequent feeding will help the mother make more milk and will help prevent problems, such as sore nipples and engorgement of the breasts.  BABY'S POSITION AT THE BREAST  Whether lying down or sitting, be sure that the baby's tummy is facing your tummy.   Support the breast with 4 fingers underneath the breast and the thumb above. Make sure your fingers are well away from the nipple and baby's mouth.   Stroke the baby's lips gently with your finger or nipple.   When the baby's mouth is open wide enough, place all of your nipple and as much of the areola as possible into your baby's mouth.   Pull the baby in close so the tip of the nose and the baby's cheeks touch the breast during the feeding.  FEEDINGS AND SUCTION  The length of each feeding varies from baby to baby and from feeding to feeding.   The baby must suck about 2 3 minutes for your milk to get to him or her. This is called a "let down." For this reason, allow the baby to feed on each breast as long as he or she wants. Your baby will end the feeding when he or she has received the right balance of nutrients.   To break the suction, put your finger into the corner of the baby's mouth and slide it between his or her gums before removing your breast from his or her mouth. This will help prevent sore nipples.  HOW TO TELL WHETHER YOUR BABY IS GETTING ENOUGH BREAST MILK. Wondering whether or not your baby is getting enough milk is a common concern among mothers. You can be assured that your baby is getting enough milk if:   Your baby is actively sucking and you hear swallowing.   Your baby seems relaxed and satisfied after a feeding.   Your baby nurses at least 8 12 times in a 24 hour time period. Nurse your baby until he or she unlatches or falls asleep at the first breast (at least 10 20 minutes), then offer the second side.   Your baby is wetting  5 6 disposable diapers (6 8 cloth diapers) in a 24 hour period by 5 6 days of age.   Your baby is having at least 3 4 stools every 24 hours for the first 6 weeks. The stool should be soft and yellow.   Your baby should gain 4 7 ounces per week after he or she is 4 days old.   Your breasts feel softer after nursing.  REDUCING BREAST ENGORGEMENT  In the first week after your baby is born, you may experience signs of breast engorgement. When breasts are engorged, they feel heavy, warm, full, and may be tender to the touch. You can reduce   engorgement if you:   Nurse frequently, every 2 3 hours. Mothers who breastfeed early and often have fewer problems with engorgement.   Place light ice packs on your breasts for 10 20 minutes between feedings. This reduces swelling. Wrap the ice packs in a lightweight towel to protect your skin. Bags of frozen vegetables work well for this purpose.   Take a warm shower or apply warm, moist heat to your breast for 5 10 minutes just before each feeding. This increases circulation and helps the milk flow.   Gently massage your breast before and during the feeding. Using your finger tips, massage from the chest wall towards your nipple in a circular motion.   Make sure that the baby empties at least one breast at every feeding before switching sides.   Use a breast pump to empty the breasts if your baby is sleepy or not nursing well. You may also want to pump if you are returning to work oryou feel you are getting engorged.   Avoid bottle feeds, pacifiers, or supplemental feedings of water or juice in place of breastfeeding. Breast milk is all the food your baby needs. It is not necessary for your baby to have water or formula. In fact, to help your breasts make more milk, it is best not to give your baby supplemental feedings during the early weeks.   Be sure the baby is latched on and positioned properly while breastfeeding.   Wear a supportive  bra, avoiding underwire styles.   Eat a balanced diet with enough fluids.   Rest often, relax, and take your prenatal vitamins to prevent fatigue, stress, and anemia.  If you follow these suggestions, your engorgement should improve in 24 48 hours. If you are still experiencing difficulty, call your lactation consultant or caregiver.  CARING FOR YOURSELF Take care of your breasts  Bathe or shower daily.   Avoid using soap on your nipples.   Start feedings on your left breast at one feeding and on your right breast at the next feeding.   You will notice an increase in your milk supply 2 5 days after delivery. You may feel some discomfort from engorgement, which makes your breasts very firm and often tender. Engorgement "peaks" out within 24 48 hours. In the meantime, apply warm moist towels to your breasts for 5 10 minutes before feeding. Gentle massage and expression of some milk before feeding will soften your breasts, making it easier for your baby to latch on.   Wear a well-fitting nursing bra, and air dry your nipples for a 3 4minutes after each feeding.   Only use cotton bra pads.   Only use pure lanolin on your nipples after nursing. You do not need to wash it off before feeding the baby again. Another option is to express a few drops of breast milk and gently massage it into your nipples.  Take care of yourself  Eat well-balanced meals and nutritious snacks.   Drinking milk, fruit juice, and water to satisfy your thirst (about 8 glasses a day).   Get plenty of rest.  Avoid foods that you notice affect the baby in a bad way.  SEEK MEDICAL CARE IF:   You have difficulty with breastfeeding and need help.   You have a hard, red, sore area on your breast that is accompanied by a fever.   Your baby is too sleepy to eat well or is having trouble sleeping.   Your baby is wetting less than   6 diapers a day, by 5 days of age.   Your baby's skin or white part of  his or her eyes is more yellow than it was in the hospital.   You feel depressed.  Document Released: 07/09/2005 Document Revised: 01/08/2012 Document Reviewed: 10/07/2011 [employer] Patient Information 2013 [employer].  

## 2012-07-21 ENCOUNTER — Other Ambulatory Visit: Payer: Self-pay | Admitting: Gynecology

## 2012-07-21 DIAGNOSIS — O24419 Gestational diabetes mellitus in pregnancy, unspecified control: Secondary | ICD-10-CM

## 2012-07-21 MED ORDER — BAYER CONTOUR NEXT TEST VI STRP: ORAL_STRIP | Status: DC

## 2012-07-21 NOTE — Telephone Encounter (Signed)
Patient call for refill on her Blood glucose test strip.Per patient RX Bayer contour next #100 send to her mail order which is [facility].

## 2012-07-28 ENCOUNTER — Encounter: Payer: BC Managed Care – PPO | Admitting: Obstetrics & Gynecology

## 2012-07-29 ENCOUNTER — Ambulatory Visit (INDEPENDENT_AMBULATORY_CARE_PROVIDER_SITE_OTHER): Payer: BC Managed Care – PPO | Admitting: Family Medicine

## 2012-07-29 VITALS — BP 112/77 | Wt 227.0 lb

## 2012-07-29 DIAGNOSIS — O99810 Abnormal glucose complicating pregnancy: Secondary | ICD-10-CM

## 2012-07-29 DIAGNOSIS — O24419 Gestational diabetes mellitus in pregnancy, unspecified control: Secondary | ICD-10-CM

## 2012-07-29 DIAGNOSIS — Z3480 Encounter for supervision of other normal pregnancy, unspecified trimester: Secondary | ICD-10-CM

## 2012-07-29 NOTE — Patient Instructions (Signed)
Gestational Diabetes Mellitus Gestational diabetes mellitus (GDM) is diabetes that occurs only during pregnancy. This happens when the body cannot properly handle the glucose (sugar) that increases in the blood after eating. During pregnancy, insulin resistance (reduced sensitivity to insulin) occurs because of the release of hormones from the placenta. Usually, the pancreas of pregnant women produces enough insulin to overcome the resistance that occurs. However, in gestational diabetes, the insulin is there but it does not work effectively. If the resistance is severe enough that the pancreas does not produce enough insulin, extra glucose builds up in the blood.  WHO IS AT RISK FOR DEVELOPING GESTATIONAL DIABETES?  Women with a history of diabetes in the family.  Women over age 25.  Women who are overweight.  Women in certain ethnic groups (Hispanic, African American, Native American, Asian and Pacific Islander). WHAT CAN HAPPEN TO THE BABY? If the mother's blood glucose is too high while she is pregnant, the extra sugar will travel through the umbilical cord to the baby. Some of the problems the baby may have are:  Large Baby - If the baby receives too much sugar, the baby will gain more weight. This may cause the baby to be too large to be born normally (vaginally) and a Cesarean section (C-section) may be needed.  Low Blood Glucose (hypoglycemia)  The baby makes extra insulin, in response to the extra sugar its gets from its mother. When the baby is born and no longer needs this extra insulin, the baby's blood glucose level may drop.  Jaundice (yellow coloring of the skin and eyes)  This is fairly common in babies. It is caused from a build-up of the chemical called bilirubin. This is rarely serious, but is seen more often in babies whose mothers had gestational diabetes. RISKS TO THE MOTHER Women who have had gestational diabetes may be at higher risk for some problems,  including:  Preeclampsia or toxemia, which includes problems with high blood pressure. Blood pressure and protein levels in the urine must be checked frequently.  Infections.  Cesarean section (C-section) for delivery.  Developing Type 2 diabetes later in life. About 30-50% will develop diabetes later, especially if obese. DIAGNOSIS  The hormones that cause insulin resistance are highest at about 24-28 weeks of pregnancy. If symptoms are experienced, they are much like symptoms you would normally expect during pregnancy.  GDM is often diagnosed using a two part method: 1. After 24-28 weeks of pregnancy, the woman drinks a glucose solution and takes a blood test. If the glucose level is high, a second test will be given. 2. Oral Glucose Tolerance Test (OGTT) which is 3 hours long  After not eating overnight, the blood glucose is checked. The woman drinks a glucose solution, and hourly blood glucose tests are taken. If the woman has risk factors for GDM, the caregiver may test earlier than 24 weeks of pregnancy. TREATMENT  Treatment of GDM is directed at keeping the mother's blood glucose level normal, and may include:  Meal planning.  Taking insulin or other medicine to control your blood glucose level.  Exercise.  Keeping a daily record of the foods you eat.  Blood glucose monitoring and keeping a record of your blood glucose levels.  May monitor ketone levels in the urine, although this is no longer considered necessary in most pregnancies. HOME CARE INSTRUCTIONS  While you are pregnant:  Follow your caregiver's advice regarding your prenatal appointments, meal planning, exercise, medicines, vitamins, blood and other tests, and physical   activities.  Keep a record of your meals, blood glucose tests, and the amount of insulin you are taking (if any). Show this to your caregiver at every prenatal visit.  If you have GDM, you may have problems with hypoglycemia (low blood glucose).  You may suspect this if you become suddenly dizzy, feel shaky, and/or weak. If you think this is happening and you have a glucose meter, try to test your blood glucose level. Follow your caregiver's advice for when and how to treat your low blood glucose. Generally, the 15:15 rule is followed: Treat by consuming 15 grams of carbohydrates, wait 15 minutes, and recheck blood glucose. Examples of 15 grams of carbohydrates are:  1 cup skim or low-fat milk.   cup juice.  3-4 glucose tablets.  5-6 hard candies.  1 small box raisins.   cup regular soda pop.  Practice good hygiene, to avoid infections.  Do not smoke. SEEK MEDICAL CARE IF:   You develop abnormal vaginal discharge, with or without itching.  You become weak and tired more than expected.  You seem to sweat a lot.  You have a sudden increase in weight, 5 pounds or more in one week.  You are losing weight, 3 pounds or more in a week.  Your blood glucose level is high, and you need instructions on what to do about it. SEEK IMMEDIATE MEDICAL CARE IF:   You develop a severe headache.  You faint or pass out.  You develop nausea and vomiting.  You become disoriented or confused.  You have a convulsion.  You develop vision problems.  You develop stomach pain.  You develop vaginal bleeding.  You develop uterine contractions.  You have leaking or a gush of fluid from the vagina. AFTER YOU HAVE THE BABY:  Go to all of your follow-up appointments, and have blood tests as advised by your caregiver.  Maintain a healthy lifestyle, to prevent diabetes in the future. This includes:  Following a healthy meal plan.  Controlling your weight.  Getting enough exercise and proper rest.  Do not smoke.  Breastfeed your baby if you can. This will lower the chance of you and your baby developing diabetes later in life. For more information about diabetes, go to the American Diabetes Association at:  www.americandiabetesassociation.org. For more information about gestational diabetes, go to the American Congress of Obstetricians and Gynecologists at: www.acog.org. Document Released: 10/15/2000 Document Revised: 10/01/2011 Document Reviewed: 05/09/2009 ExitCare Patient Information 2013 [employer].  Breastfeeding Deciding to breastfeed is one of the best choices you can make for you and your baby. The information that follows gives a brief overview of the benefits of breastfeeding as well as common topics surrounding breastfeeding. BENEFITS OF BREASTFEEDING For the baby  The first milk (colostrum) helps the baby's digestive system function better.   There are antibodies in the mother's milk that help the baby fight off infections.   The baby has a lower incidence of asthma, allergies, and sudden infant death syndrome (SIDS).   The nutrients in breast milk are better for the baby than infant formulas, and breast milk helps the baby's brain grow better.   Babies who breastfeed have less gas, colic, and constipation.  For the mother  Breastfeeding helps develop a very special bond between the mother and her baby.   Breastfeeding is convenient, always available at the correct temperature, and costs nothing.   Breastfeeding burns calories in the mother and helps her lose weight that was gained during pregnancy.     Breastfeeding makes the uterus contract back down to normal size faster and slows bleeding following delivery.   Breastfeeding mothers have a lower risk of developing breast cancer.  BREASTFEEDING FREQUENCY  A healthy, full-term baby may breastfeed as often as every hour or space his or her feedings to every 3 hours.   Watch your baby for signs of hunger. Nurse your baby if he or she shows signs of hunger. How often you nurse will vary from baby to baby.   Nurse as often as the baby requests, or when you feel the need to reduce the fullness of your breasts.    Awaken the baby if it has been 3 4 hours since the last feeding.   Frequent feeding will help the mother make more milk and will help prevent problems, such as sore nipples and engorgement of the breasts.  BABY'S POSITION AT THE BREAST  Whether lying down or sitting, be sure that the baby's tummy is facing your tummy.   Support the breast with 4 fingers underneath the breast and the thumb above. Make sure your fingers are well away from the nipple and baby's mouth.   Stroke the baby's lips gently with your finger or nipple.   When the baby's mouth is open wide enough, place all of your nipple and as much of the areola as possible into your baby's mouth.   Pull the baby in close so the tip of the nose and the baby's cheeks touch the breast during the feeding.  FEEDINGS AND SUCTION  The length of each feeding varies from baby to baby and from feeding to feeding.   The baby must suck about 2 3 minutes for your milk to get to him or her. This is called a "let down." For this reason, allow the baby to feed on each breast as long as he or she wants. Your baby will end the feeding when he or she has received the right balance of nutrients.   To break the suction, put your finger into the corner of the baby's mouth and slide it between his or her gums before removing your breast from his or her mouth. This will help prevent sore nipples.  HOW TO TELL WHETHER YOUR BABY IS GETTING ENOUGH BREAST MILK. Wondering whether or not your baby is getting enough milk is a common concern among mothers. You can be assured that your baby is getting enough milk if:   Your baby is actively sucking and you hear swallowing.   Your baby seems relaxed and satisfied after a feeding.   Your baby nurses at least 8 12 times in a 24 hour time period. Nurse your baby until he or she unlatches or falls asleep at the first breast (at least 10 20 minutes), then offer the second side.   Your baby is wetting  5 6 disposable diapers (6 8 cloth diapers) in a 24 hour period by 5 6 days of age.   Your baby is having at least 3 4 stools every 24 hours for the first 6 weeks. The stool should be soft and yellow.   Your baby should gain 4 7 ounces per week after he or she is 4 days old.   Your breasts feel softer after nursing.  REDUCING BREAST ENGORGEMENT  In the first week after your baby is born, you may experience signs of breast engorgement. When breasts are engorged, they feel heavy, warm, full, and may be tender to the touch. You can reduce   engorgement if you:   Nurse frequently, every 2 3 hours. Mothers who breastfeed early and often have fewer problems with engorgement.   Place light ice packs on your breasts for 10 20 minutes between feedings. This reduces swelling. Wrap the ice packs in a lightweight towel to protect your skin. Bags of frozen vegetables work well for this purpose.   Take a warm shower or apply warm, moist heat to your breast for 5 10 minutes just before each feeding. This increases circulation and helps the milk flow.   Gently massage your breast before and during the feeding. Using your finger tips, massage from the chest wall towards your nipple in a circular motion.   Make sure that the baby empties at least one breast at every feeding before switching sides.   Use a breast pump to empty the breasts if your baby is sleepy or not nursing well. You may also want to pump if you are returning to work oryou feel you are getting engorged.   Avoid bottle feeds, pacifiers, or supplemental feedings of water or juice in place of breastfeeding. Breast milk is all the food your baby needs. It is not necessary for your baby to have water or formula. In fact, to help your breasts make more milk, it is best not to give your baby supplemental feedings during the early weeks.   Be sure the baby is latched on and positioned properly while breastfeeding.   Wear a supportive  bra, avoiding underwire styles.   Eat a balanced diet with enough fluids.   Rest often, relax, and take your prenatal vitamins to prevent fatigue, stress, and anemia.  If you follow these suggestions, your engorgement should improve in 24 48 hours. If you are still experiencing difficulty, call your lactation consultant or caregiver.  CARING FOR YOURSELF Take care of your breasts  Bathe or shower daily.   Avoid using soap on your nipples.   Start feedings on your left breast at one feeding and on your right breast at the next feeding.   You will notice an increase in your milk supply 2 5 days after delivery. You may feel some discomfort from engorgement, which makes your breasts very firm and often tender. Engorgement "peaks" out within 24 48 hours. In the meantime, apply warm moist towels to your breasts for 5 10 minutes before feeding. Gentle massage and expression of some milk before feeding will soften your breasts, making it easier for your baby to latch on.   Wear a well-fitting nursing bra, and air dry your nipples for a 3 4minutes after each feeding.   Only use cotton bra pads.   Only use pure lanolin on your nipples after nursing. You do not need to wash it off before feeding the baby again. Another option is to express a few drops of breast milk and gently massage it into your nipples.  Take care of yourself  Eat well-balanced meals and nutritious snacks.   Drinking milk, fruit juice, and water to satisfy your thirst (about 8 glasses a day).   Get plenty of rest.  Avoid foods that you notice affect the baby in a bad way.  SEEK MEDICAL CARE IF:   You have difficulty with breastfeeding and need help.   You have a hard, red, sore area on your breast that is accompanied by a fever.   Your baby is too sleepy to eat well or is having trouble sleeping.   Your baby is wetting less than   6 diapers a day, by 5 days of age.   Your baby's skin or white part of  his or her eyes is more yellow than it was in the hospital.   You feel depressed.  Document Released: 07/09/2005 Document Revised: 01/08/2012 Document Reviewed: 10/07/2011 [employer] Patient Information 2013 [employer].  

## 2012-07-29 NOTE — Progress Notes (Signed)
Brings BS most are in range Occasionally up with dietary indiscretion, will work on.--adding exercise.

## 2012-07-29 NOTE — Assessment & Plan Note (Signed)
Add exercise.

## 2012-08-08 ENCOUNTER — Encounter: Payer: BC Managed Care – PPO | Admitting: Family Medicine

## 2012-08-12 ENCOUNTER — Encounter: Payer: BC Managed Care – PPO | Admitting: Family Medicine

## 2012-08-15 ENCOUNTER — Ambulatory Visit (INDEPENDENT_AMBULATORY_CARE_PROVIDER_SITE_OTHER): Payer: BC Managed Care – PPO | Admitting: Family Medicine

## 2012-08-15 VITALS — BP 122/77 | Wt 236.0 lb

## 2012-08-15 DIAGNOSIS — O24419 Gestational diabetes mellitus in pregnancy, unspecified control: Secondary | ICD-10-CM

## 2012-08-15 DIAGNOSIS — O99810 Abnormal glucose complicating pregnancy: Secondary | ICD-10-CM

## 2012-08-15 DIAGNOSIS — Z3480 Encounter for supervision of other normal pregnancy, unspecified trimester: Secondary | ICD-10-CM

## 2012-08-15 LAB — OB RESULTS CONSOLE GBS: GBS: NEGATIVE

## 2012-08-15 MED ORDER — METFORMIN HCL 500 MG PO TABS: 500.0000 mg | ORAL_TABLET | Freq: Two times a day (BID) | ORAL | Status: DC

## 2012-08-15 NOTE — Patient Instructions (Signed)
Gestational Diabetes Mellitus Gestational diabetes mellitus (GDM) is diabetes that occurs only during pregnancy. This happens when the body cannot properly handle the glucose (sugar) that increases in the blood after eating. During pregnancy, insulin resistance (reduced sensitivity to insulin) occurs because of the release of hormones from the placenta. Usually, the pancreas of pregnant women produces enough insulin to overcome the resistance that occurs. However, in gestational diabetes, the insulin is there but it does not work effectively. If the resistance is severe enough that the pancreas does not produce enough insulin, extra glucose builds up in the blood.  WHO IS AT RISK FOR DEVELOPING GESTATIONAL DIABETES?  Women with a history of diabetes in the family.  Women over age 25.  Women who are overweight.  Women in certain ethnic groups (Hispanic, African American, Native American, Asian and Pacific Islander). WHAT CAN HAPPEN TO THE BABY? If the mother's blood glucose is too high while she is pregnant, the extra sugar will travel through the umbilical cord to the baby. Some of the problems the baby may have are:  Large Baby - If the baby receives too much sugar, the baby will gain more weight. This may cause the baby to be too large to be born normally (vaginally) and a Cesarean section (C-section) may be needed.  Low Blood Glucose (hypoglycemia)  The baby makes extra insulin, in response to the extra sugar its gets from its mother. When the baby is born and no longer needs this extra insulin, the baby's blood glucose level may drop.  Jaundice (yellow coloring of the skin and eyes)  This is fairly common in babies. It is caused from a build-up of the chemical called bilirubin. This is rarely serious, but is seen more often in babies whose mothers had gestational diabetes. RISKS TO THE MOTHER Women who have had gestational diabetes may be at higher risk for some problems,  including:  Preeclampsia or toxemia, which includes problems with high blood pressure. Blood pressure and protein levels in the urine must be checked frequently.  Infections.  Cesarean section (C-section) for delivery.  Developing Type 2 diabetes later in life. About 30-50% will develop diabetes later, especially if obese. DIAGNOSIS  The hormones that cause insulin resistance are highest at about 24-28 weeks of pregnancy. If symptoms are experienced, they are much like symptoms you would normally expect during pregnancy.  GDM is often diagnosed using a two part method: 1. After 24-28 weeks of pregnancy, the woman drinks a glucose solution and takes a blood test. If the glucose level is high, a second test will be given. 2. Oral Glucose Tolerance Test (OGTT) which is 3 hours long  After not eating overnight, the blood glucose is checked. The woman drinks a glucose solution, and hourly blood glucose tests are taken. If the woman has risk factors for GDM, the caregiver may test earlier than 24 weeks of pregnancy. TREATMENT  Treatment of GDM is directed at keeping the mother's blood glucose level normal, and may include:  Meal planning.  Taking insulin or other medicine to control your blood glucose level.  Exercise.  Keeping a daily record of the foods you eat.  Blood glucose monitoring and keeping a record of your blood glucose levels.  May monitor ketone levels in the urine, although this is no longer considered necessary in most pregnancies. HOME CARE INSTRUCTIONS  While you are pregnant:  Follow your caregiver's advice regarding your prenatal appointments, meal planning, exercise, medicines, vitamins, blood and other tests, and physical   activities.  Keep a record of your meals, blood glucose tests, and the amount of insulin you are taking (if any). Show this to your caregiver at every prenatal visit.  If you have GDM, you may have problems with hypoglycemia (low blood glucose).  You may suspect this if you become suddenly dizzy, feel shaky, and/or weak. If you think this is happening and you have a glucose meter, try to test your blood glucose level. Follow your caregiver's advice for when and how to treat your low blood glucose. Generally, the 15:15 rule is followed: Treat by consuming 15 grams of carbohydrates, wait 15 minutes, and recheck blood glucose. Examples of 15 grams of carbohydrates are:  1 cup skim or low-fat milk.   cup juice.  3-4 glucose tablets.  5-6 hard candies.  1 small box raisins.   cup regular soda pop.  Practice good hygiene, to avoid infections.  Do not smoke. SEEK MEDICAL CARE IF:   You develop abnormal vaginal discharge, with or without itching.  You become weak and tired more than expected.  You seem to sweat a lot.  You have a sudden increase in weight, 5 pounds or more in one week.  You are losing weight, 3 pounds or more in a week.  Your blood glucose level is high, and you need instructions on what to do about it. SEEK IMMEDIATE MEDICAL CARE IF:   You develop a severe headache.  You faint or pass out.  You develop nausea and vomiting.  You become disoriented or confused.  You have a convulsion.  You develop vision problems.  You develop stomach pain.  You develop vaginal bleeding.  You develop uterine contractions.  You have leaking or a gush of fluid from the vagina. AFTER YOU HAVE THE BABY:  Go to all of your follow-up appointments, and have blood tests as advised by your caregiver.  Maintain a healthy lifestyle, to prevent diabetes in the future. This includes:  Following a healthy meal plan.  Controlling your weight.  Getting enough exercise and proper rest.  Do not smoke.  Breastfeed your baby if you can. This will lower the chance of you and your baby developing diabetes later in life. For more information about diabetes, go to the American Diabetes Association at:  www.americandiabetesassociation.org. For more information about gestational diabetes, go to the American Congress of Obstetricians and Gynecologists at: www.acog.org. Document Released: 10/15/2000 Document Revised: 10/01/2011 Document Reviewed: 05/09/2009 ExitCare Patient Information 2013 [employer].  Breastfeeding Deciding to breastfeed is one of the best choices you can make for you and your baby. The information that follows gives a brief overview of the benefits of breastfeeding as well as common topics surrounding breastfeeding. BENEFITS OF BREASTFEEDING For the baby  The first milk (colostrum) helps the baby's digestive system function better.   There are antibodies in the mother's milk that help the baby fight off infections.   The baby has a lower incidence of asthma, allergies, and sudden infant death syndrome (SIDS).   The nutrients in breast milk are better for the baby than infant formulas, and breast milk helps the baby's brain grow better.   Babies who breastfeed have less gas, colic, and constipation.  For the mother  Breastfeeding helps develop a very special bond between the mother and her baby.   Breastfeeding is convenient, always available at the correct temperature, and costs nothing.   Breastfeeding burns calories in the mother and helps her lose weight that was gained during pregnancy.     Breastfeeding makes the uterus contract back down to normal size faster and slows bleeding following delivery.   Breastfeeding mothers have a lower risk of developing breast cancer.  BREASTFEEDING FREQUENCY  A healthy, full-term baby may breastfeed as often as every hour or space his or her feedings to every 3 hours.   Watch your baby for signs of hunger. Nurse your baby if he or she shows signs of hunger. How often you nurse will vary from baby to baby.   Nurse as often as the baby requests, or when you feel the need to reduce the fullness of your breasts.    Awaken the baby if it has been 3 4 hours since the last feeding.   Frequent feeding will help the mother make more milk and will help prevent problems, such as sore nipples and engorgement of the breasts.  BABY'S POSITION AT THE BREAST  Whether lying down or sitting, be sure that the baby's tummy is facing your tummy.   Support the breast with 4 fingers underneath the breast and the thumb above. Make sure your fingers are well away from the nipple and baby's mouth.   Stroke the baby's lips gently with your finger or nipple.   When the baby's mouth is open wide enough, place all of your nipple and as much of the areola as possible into your baby's mouth.   Pull the baby in close so the tip of the nose and the baby's cheeks touch the breast during the feeding.  FEEDINGS AND SUCTION  The length of each feeding varies from baby to baby and from feeding to feeding.   The baby must suck about 2 3 minutes for your milk to get to him or her. This is called a "let down." For this reason, allow the baby to feed on each breast as long as he or she wants. Your baby will end the feeding when he or she has received the right balance of nutrients.   To break the suction, put your finger into the corner of the baby's mouth and slide it between his or her gums before removing your breast from his or her mouth. This will help prevent sore nipples.  HOW TO TELL WHETHER YOUR BABY IS GETTING ENOUGH BREAST MILK. Wondering whether or not your baby is getting enough milk is a common concern among mothers. You can be assured that your baby is getting enough milk if:   Your baby is actively sucking and you hear swallowing.   Your baby seems relaxed and satisfied after a feeding.   Your baby nurses at least 8 12 times in a 24 hour time period. Nurse your baby until he or she unlatches or falls asleep at the first breast (at least 10 20 minutes), then offer the second side.   Your baby is wetting  5 6 disposable diapers (6 8 cloth diapers) in a 24 hour period by 5 6 days of age.   Your baby is having at least 3 4 stools every 24 hours for the first 6 weeks. The stool should be soft and yellow.   Your baby should gain 4 7 ounces per week after he or she is 4 days old.   Your breasts feel softer after nursing.  REDUCING BREAST ENGORGEMENT  In the first week after your baby is born, you may experience signs of breast engorgement. When breasts are engorged, they feel heavy, warm, full, and may be tender to the touch. You can reduce   engorgement if you:   Nurse frequently, every 2 3 hours. Mothers who breastfeed early and often have fewer problems with engorgement.   Place light ice packs on your breasts for 10 20 minutes between feedings. This reduces swelling. Wrap the ice packs in a lightweight towel to protect your skin. Bags of frozen vegetables work well for this purpose.   Take a warm shower or apply warm, moist heat to your breast for 5 10 minutes just before each feeding. This increases circulation and helps the milk flow.   Gently massage your breast before and during the feeding. Using your finger tips, massage from the chest wall towards your nipple in a circular motion.   Make sure that the baby empties at least one breast at every feeding before switching sides.   Use a breast pump to empty the breasts if your baby is sleepy or not nursing well. You may also want to pump if you are returning to work oryou feel you are getting engorged.   Avoid bottle feeds, pacifiers, or supplemental feedings of water or juice in place of breastfeeding. Breast milk is all the food your baby needs. It is not necessary for your baby to have water or formula. In fact, to help your breasts make more milk, it is best not to give your baby supplemental feedings during the early weeks.   Be sure the baby is latched on and positioned properly while breastfeeding.   Wear a supportive  bra, avoiding underwire styles.   Eat a balanced diet with enough fluids.   Rest often, relax, and take your prenatal vitamins to prevent fatigue, stress, and anemia.  If you follow these suggestions, your engorgement should improve in 24 48 hours. If you are still experiencing difficulty, call your lactation consultant or caregiver.  CARING FOR YOURSELF Take care of your breasts  Bathe or shower daily.   Avoid using soap on your nipples.   Start feedings on your left breast at one feeding and on your right breast at the next feeding.   You will notice an increase in your milk supply 2 5 days after delivery. You may feel some discomfort from engorgement, which makes your breasts very firm and often tender. Engorgement "peaks" out within 24 48 hours. In the meantime, apply warm moist towels to your breasts for 5 10 minutes before feeding. Gentle massage and expression of some milk before feeding will soften your breasts, making it easier for your baby to latch on.   Wear a well-fitting nursing bra, and air dry your nipples for a 3 4minutes after each feeding.   Only use cotton bra pads.   Only use pure lanolin on your nipples after nursing. You do not need to wash it off before feeding the baby again. Another option is to express a few drops of breast milk and gently massage it into your nipples.  Take care of yourself  Eat well-balanced meals and nutritious snacks.   Drinking milk, fruit juice, and water to satisfy your thirst (about 8 glasses a day).   Get plenty of rest.  Avoid foods that you notice affect the baby in a bad way.  SEEK MEDICAL CARE IF:   You have difficulty with breastfeeding and need help.   You have a hard, red, sore area on your breast that is accompanied by a fever.   Your baby is too sleepy to eat well or is having trouble sleeping.   Your baby is wetting less than   6 diapers a day, by 5 days of age.   Your baby's skin or white part of  his or her eyes is more yellow than it was in the hospital.   You feel depressed.  Document Released: 07/09/2005 Document Revised: 01/08/2012 Document Reviewed: 10/07/2011 [employer] Patient Information 2013 [employer].  

## 2012-08-15 NOTE — Progress Notes (Signed)
Head is low Cultures done U/S for growth FBS 103-122 2 hr pp 93-171--did not tolerate glyburide--will try metformin 500 mg bid--Start 2x/wk testing--IOL at 39 wks NST reviewed and reactive.

## 2012-08-16 LAB — GC/CHLAMYDIA PROBE AMP, GENITAL
Chlamydia, DNA Probe: NEGATIVE
GC Probe Amp, Genital: NEGATIVE

## 2012-08-18 ENCOUNTER — Ambulatory Visit (INDEPENDENT_AMBULATORY_CARE_PROVIDER_SITE_OTHER): Payer: BC Managed Care – PPO | Admitting: Family Medicine

## 2012-08-18 ENCOUNTER — Encounter: Payer: Self-pay | Admitting: Family Medicine

## 2012-08-18 VITALS — BP 117/79 | Wt 235.0 lb

## 2012-08-18 DIAGNOSIS — Z3480 Encounter for supervision of other normal pregnancy, unspecified trimester: Secondary | ICD-10-CM

## 2012-08-18 DIAGNOSIS — O99810 Abnormal glucose complicating pregnancy: Secondary | ICD-10-CM

## 2012-08-18 DIAGNOSIS — O24419 Gestational diabetes mellitus in pregnancy, unspecified control: Secondary | ICD-10-CM

## 2012-08-18 NOTE — Progress Notes (Signed)
FBS 107 2 hour 96-164--eating fruits  BS mildly improved with Metformin. NST reviewed and reactive.

## 2012-08-18 NOTE — Patient Instructions (Signed)
Breastfeeding Deciding to breastfeed is one of the best choices you can make for you and your baby. The information that follows gives a brief overview of the benefits of breastfeeding as well as common topics surrounding breastfeeding. BENEFITS OF BREASTFEEDING For the baby  The first milk (colostrum) helps the baby's digestive system function better.   There are antibodies in the mother's milk that help the baby fight off infections.   The baby has a lower incidence of asthma, allergies, and sudden infant death syndrome (SIDS).   The nutrients in breast milk are better for the baby than infant formulas, and breast milk helps the baby's brain grow better.   Babies who breastfeed have less gas, colic, and constipation.  For the mother  Breastfeeding helps develop a very special bond between the mother and her baby.   Breastfeeding is convenient, always available at the correct temperature, and costs nothing.   Breastfeeding burns calories in the mother and helps her lose weight that was gained during pregnancy.   Breastfeeding makes the uterus contract back down to normal size faster and slows bleeding following delivery.   Breastfeeding mothers have a lower risk of developing breast cancer.  BREASTFEEDING FREQUENCY  A healthy, full-term baby may breastfeed as often as every hour or space his or her feedings to every 3 hours.   Watch your baby for signs of hunger. Nurse your baby if he or she shows signs of hunger. How often you nurse will vary from baby to baby.   Nurse as often as the baby requests, or when you feel the need to reduce the fullness of your breasts.   Awaken the baby if it has been 3 4 hours since the last feeding.   Frequent feeding will help the mother make more milk and will help prevent problems, such as sore nipples and engorgement of the breasts.  BABY'S POSITION AT THE BREAST  Whether lying down or sitting, be sure that the baby's tummy is  facing your tummy.   Support the breast with 4 fingers underneath the breast and the thumb above. Make sure your fingers are well away from the nipple and baby's mouth.   Stroke the baby's lips gently with your finger or nipple.   When the baby's mouth is open wide enough, place all of your nipple and as much of the areola as possible into your baby's mouth.   Pull the baby in close so the tip of the nose and the baby's cheeks touch the breast during the feeding.  FEEDINGS AND SUCTION  The length of each feeding varies from baby to baby and from feeding to feeding.   The baby must suck about 2 3 minutes for your milk to get to him or her. This is called a "let down." For this reason, allow the baby to feed on each breast as long as he or she wants. Your baby will end the feeding when he or she has received the right balance of nutrients.   To break the suction, put your finger into the corner of the baby's mouth and slide it between his or her gums before removing your breast from his or her mouth. This will help prevent sore nipples.  HOW TO TELL WHETHER YOUR BABY IS GETTING ENOUGH BREAST MILK. Wondering whether or not your baby is getting enough milk is a common concern among mothers. You can be assured that your baby is getting enough milk if:   Your baby is actively   sucking and you hear swallowing.   Your baby seems relaxed and satisfied after a feeding.   Your baby nurses at least 8 12 times in a 24 hour time period. Nurse your baby until he or she unlatches or falls asleep at the first breast (at least 10 20 minutes), then offer the second side.   Your baby is wetting 5 6 disposable diapers (6 8 cloth diapers) in a 24 hour period by 5 6 days of age.   Your baby is having at least 3 4 stools every 24 hours for the first 6 weeks. The stool should be soft and yellow.   Your baby should gain 4 7 ounces per week after he or she is 4 days old.   Your breasts feel softer  after nursing.  REDUCING BREAST ENGORGEMENT  In the first week after your baby is born, you may experience signs of breast engorgement. When breasts are engorged, they feel heavy, warm, full, and may be tender to the touch. You can reduce engorgement if you:   Nurse frequently, every 2 3 hours. Mothers who breastfeed early and often have fewer problems with engorgement.   Place light ice packs on your breasts for 10 20 minutes between feedings. This reduces swelling. Wrap the ice packs in a lightweight towel to protect your skin. Bags of frozen vegetables work well for this purpose.   Take a warm shower or apply warm, moist heat to your breast for 5 10 minutes just before each feeding. This increases circulation and helps the milk flow.   Gently massage your breast before and during the feeding. Using your finger tips, massage from the chest wall towards your nipple in a circular motion.   Make sure that the baby empties at least one breast at every feeding before switching sides.   Use a breast pump to empty the breasts if your baby is sleepy or not nursing well. You may also want to pump if you are returning to work oryou feel you are getting engorged.   Avoid bottle feeds, pacifiers, or supplemental feedings of water or juice in place of breastfeeding. Breast milk is all the food your baby needs. It is not necessary for your baby to have water or formula. In fact, to help your breasts make more milk, it is best not to give your baby supplemental feedings during the early weeks.   Be sure the baby is latched on and positioned properly while breastfeeding.   Wear a supportive bra, avoiding underwire styles.   Eat a balanced diet with enough fluids.   Rest often, relax, and take your prenatal vitamins to prevent fatigue, stress, and anemia.  If you follow these suggestions, your engorgement should improve in 24 48 hours. If you are still experiencing difficulty, call your  lactation consultant or caregiver.  CARING FOR YOURSELF Take care of your breasts  Bathe or shower daily.   Avoid using soap on your nipples.   Start feedings on your left breast at one feeding and on your right breast at the next feeding.   You will notice an increase in your milk supply 2 5 days after delivery. You may feel some discomfort from engorgement, which makes your breasts very firm and often tender. Engorgement "peaks" out within 24 48 hours. In the meantime, apply warm moist towels to your breasts for 5 10 minutes before feeding. Gentle massage and expression of some milk before feeding will soften your breasts, making it easier for your   baby to latch on.   Wear a well-fitting nursing bra, and air dry your nipples for a 3 4minutes after each feeding.   Only use cotton bra pads.   Only use pure lanolin on your nipples after nursing. You do not need to wash it off before feeding the baby again. Another option is to express a few drops of breast milk and gently massage it into your nipples.  Take care of yourself  Eat well-balanced meals and nutritious snacks.   Drinking milk, fruit juice, and water to satisfy your thirst (about 8 glasses a day).   Get plenty of rest.  Avoid foods that you notice affect the baby in a bad way.  SEEK MEDICAL CARE IF:   You have difficulty with breastfeeding and need help.   You have a hard, red, sore area on your breast that is accompanied by a fever.   Your baby is too sleepy to eat well or is having trouble sleeping.   Your baby is wetting less than 6 diapers a day, by 5 days of age.   Your baby's skin or white part of his or her eyes is more yellow than it was in the hospital.   You feel depressed.  Document Released: 07/09/2005 Document Revised: 01/08/2012 Document Reviewed: 10/07/2011 [employer] Patient Information 2013 [employer].  

## 2012-08-19 ENCOUNTER — Encounter: Payer: BC Managed Care – PPO | Admitting: Obstetrics & Gynecology

## 2012-08-20 ENCOUNTER — Telehealth: Payer: Self-pay | Admitting: *Deleted

## 2012-08-20 ENCOUNTER — Telehealth (HOSPITAL_COMMUNITY): Payer: Self-pay | Admitting: *Deleted

## 2012-08-20 ENCOUNTER — Encounter (HOSPITAL_COMMUNITY): Payer: Self-pay | Admitting: *Deleted

## 2012-08-20 NOTE — Telephone Encounter (Signed)
Preadmission screen  

## 2012-08-20 NOTE — Telephone Encounter (Signed)
Patient is having a headache that she has had since Monday.  She has checked her blood pressure and it has remained in the 120's over 80's.  She has no other new symptoms and just wanted to check with [place].  Tylenol is not giving her relief and she has been drinking enough fluids per her.  I have advised her to continue the Tylenol, add ice pack to area of head where head is hurting and take an antihistimine like zyrtec.  She will keep a check on her BP and continue to rest.  She will call us back if her symptoms persist or change.

## 2012-08-21 ENCOUNTER — Encounter: Payer: Self-pay | Admitting: Family Medicine

## 2012-08-21 ENCOUNTER — Ambulatory Visit (INDEPENDENT_AMBULATORY_CARE_PROVIDER_SITE_OTHER): Payer: BC Managed Care – PPO | Admitting: *Deleted

## 2012-08-21 ENCOUNTER — Ambulatory Visit (HOSPITAL_COMMUNITY)
Admission: RE | Admit: 2012-08-21 | Discharge: 2012-08-21 | Disposition: A | Discharge status: Home / Self Care | Payer: BC Managed Care – PPO | Source: Ambulatory Visit | Attending: Family Medicine | Admitting: Family Medicine

## 2012-08-21 VITALS — BP 133/79 | Wt 237.3 lb

## 2012-08-21 DIAGNOSIS — O09519 Supervision of elderly primigravida, unspecified trimester: Secondary | ICD-10-CM | POA: Insufficient documentation

## 2012-08-21 DIAGNOSIS — IMO0002 Reserved for concepts with insufficient information to code with codable children: Secondary | ICD-10-CM

## 2012-08-21 DIAGNOSIS — O24419 Gestational diabetes mellitus in pregnancy, unspecified control: Secondary | ICD-10-CM

## 2012-08-21 DIAGNOSIS — D259 Leiomyoma of uterus, unspecified: Secondary | ICD-10-CM

## 2012-08-21 DIAGNOSIS — O99810 Abnormal glucose complicating pregnancy: Secondary | ICD-10-CM | POA: Insufficient documentation

## 2012-08-21 DIAGNOSIS — O3410 Maternal care for benign tumor of corpus uteri, unspecified trimester: Secondary | ICD-10-CM

## 2012-08-21 DIAGNOSIS — O0990 Supervision of high risk pregnancy, unspecified, unspecified trimester: Secondary | ICD-10-CM

## 2012-08-21 NOTE — Progress Notes (Signed)
P = 90    Per consult w/Dr. Shawnie Pons, pt may have NST @ [facility] office on 2/6 as long as the [place] growth and AFI are wnl today.

## 2012-08-21 NOTE — Progress Notes (Signed)
NST reviewed and reactive.  

## 2012-08-22 ENCOUNTER — Ambulatory Visit (HOSPITAL_COMMUNITY): Payer: BC Managed Care – PPO

## 2012-08-25 ENCOUNTER — Ambulatory Visit (INDEPENDENT_AMBULATORY_CARE_PROVIDER_SITE_OTHER): Payer: BC Managed Care – PPO | Admitting: Family Medicine

## 2012-08-25 VITALS — BP 119/73 | Wt 237.0 lb

## 2012-08-25 DIAGNOSIS — Z3480 Encounter for supervision of other normal pregnancy, unspecified trimester: Secondary | ICD-10-CM

## 2012-08-25 DIAGNOSIS — O99810 Abnormal glucose complicating pregnancy: Secondary | ICD-10-CM

## 2012-08-25 DIAGNOSIS — O24419 Gestational diabetes mellitus in pregnancy, unspecified control: Secondary | ICD-10-CM

## 2012-08-25 NOTE — Progress Notes (Signed)
NST reviewed and reactive. FBS 90-109 2 hr pp 93-119  Looks better with Metformin.  Now with poly and LGA--for IOL 39 wks.  NST later this wk.

## 2012-08-25 NOTE — Assessment & Plan Note (Signed)
Continue w/ 2x/wk testing until delivery.

## 2012-08-25 NOTE — Patient Instructions (Addendum)
Gestational Diabetes Mellitus Gestational diabetes mellitus (GDM) is diabetes that occurs only during pregnancy. This happens when the body cannot properly handle the glucose (sugar) that increases in the blood after eating. During pregnancy, insulin resistance (reduced sensitivity to insulin) occurs because of the release of hormones from the placenta. Usually, the pancreas of pregnant women produces enough insulin to overcome the resistance that occurs. However, in gestational diabetes, the insulin is there but it does not work effectively. If the resistance is severe enough that the pancreas does not produce enough insulin, extra glucose builds up in the blood.  WHO IS AT RISK FOR DEVELOPING GESTATIONAL DIABETES?  Women with a history of diabetes in the family.  Women over age 25.  Women who are overweight.  Women in certain ethnic groups (Hispanic, African American, Native American, Asian and Pacific Islander). WHAT CAN HAPPEN TO THE BABY? If the mother's blood glucose is too high while she is pregnant, the extra sugar will travel through the umbilical cord to the baby. Some of the problems the baby may have are:  Large Baby - If the baby receives too much sugar, the baby will gain more weight. This may cause the baby to be too large to be born normally (vaginally) and a Cesarean section (C-section) may be needed.  Low Blood Glucose (hypoglycemia)  The baby makes extra insulin, in response to the extra sugar its gets from its mother. When the baby is born and no longer needs this extra insulin, the baby's blood glucose level may drop.  Jaundice (yellow coloring of the skin and eyes)  This is fairly common in babies. It is caused from a build-up of the chemical called bilirubin. This is rarely serious, but is seen more often in babies whose mothers had gestational diabetes. RISKS TO THE MOTHER Women who have had gestational diabetes may be at higher risk for some problems,  including:  Preeclampsia or toxemia, which includes problems with high blood pressure. Blood pressure and protein levels in the urine must be checked frequently.  Infections.  Cesarean section (C-section) for delivery.  Developing Type 2 diabetes later in life. About 30-50% will develop diabetes later, especially if obese. DIAGNOSIS  The hormones that cause insulin resistance are highest at about 24-28 weeks of pregnancy. If symptoms are experienced, they are much like symptoms you would normally expect during pregnancy.  GDM is often diagnosed using a two part method: 1. After 24-28 weeks of pregnancy, the woman drinks a glucose solution and takes a blood test. If the glucose level is high, a second test will be given. 2. Oral Glucose Tolerance Test (OGTT) which is 3 hours long  After not eating overnight, the blood glucose is checked. The woman drinks a glucose solution, and hourly blood glucose tests are taken. If the woman has risk factors for GDM, the caregiver may test earlier than 24 weeks of pregnancy. TREATMENT  Treatment of GDM is directed at keeping the mother's blood glucose level normal, and may include:  Meal planning.  Taking insulin or other medicine to control your blood glucose level.  Exercise.  Keeping a daily record of the foods you eat.  Blood glucose monitoring and keeping a record of your blood glucose levels.  May monitor ketone levels in the urine, although this is no longer considered necessary in most pregnancies. HOME CARE INSTRUCTIONS  While you are pregnant:  Follow your caregiver's advice regarding your prenatal appointments, meal planning, exercise, medicines, vitamins, blood and other tests, and physical   activities.  Keep a record of your meals, blood glucose tests, and the amount of insulin you are taking (if any). Show this to your caregiver at every prenatal visit.  If you have GDM, you may have problems with hypoglycemia (low blood glucose).  You may suspect this if you become suddenly dizzy, feel shaky, and/or weak. If you think this is happening and you have a glucose meter, try to test your blood glucose level. Follow your caregiver's advice for when and how to treat your low blood glucose. Generally, the 15:15 rule is followed: Treat by consuming 15 grams of carbohydrates, wait 15 minutes, and recheck blood glucose. Examples of 15 grams of carbohydrates are:  1 cup skim or low-fat milk.   cup juice.  3-4 glucose tablets.  5-6 hard candies.  1 small box raisins.   cup regular soda pop.  Practice good hygiene, to avoid infections.  Do not smoke. SEEK MEDICAL CARE IF:   You develop abnormal vaginal discharge, with or without itching.  You become weak and tired more than expected.  You seem to sweat a lot.  You have a sudden increase in weight, 5 pounds or more in one week.  You are losing weight, 3 pounds or more in a week.  Your blood glucose level is high, and you need instructions on what to do about it. SEEK IMMEDIATE MEDICAL CARE IF:   You develop a severe headache.  You faint or pass out.  You develop nausea and vomiting.  You become disoriented or confused.  You have a convulsion.  You develop vision problems.  You develop stomach pain.  You develop vaginal bleeding.  You develop uterine contractions.  You have leaking or a gush of fluid from the vagina. AFTER YOU HAVE THE BABY:  Go to all of your follow-up appointments, and have blood tests as advised by your caregiver.  Maintain a healthy lifestyle, to prevent diabetes in the future. This includes:  Following a healthy meal plan.  Controlling your weight.  Getting enough exercise and proper rest.  Do not smoke.  Breastfeed your baby if you can. This will lower the chance of you and your baby developing diabetes later in life. For more information about diabetes, go to the American Diabetes Association at:  PMFashions.com.cy. For more information about gestational diabetes, go to the [employer] at: RentRule.com.au. Document Released: 10/15/2000 Document Revised: 10/01/2011 Document Reviewed: 05/09/2009 [facility] Patient Information 2013 [place], [place]. Labor Induction  Most women go into labor on their own between 49 and 42 weeks of the pregnancy. When this does not happen or when there is a medical need, medicine or other methods may be used to induce labor. Labor induction causes a pregnant woman's uterus to contract. It also causes the cervix to soften (ripen), open (dilate), and thin out (efface). Usually, labor is not induced before 39 weeks of the pregnancy unless there is a problem with the baby or mother. Whether your labor will be induced depends on a number of factors, including the following:  The medical condition of you and the baby.  How many weeks along you are.  The status of baby's lung maturity.  The condition of the cervix.  The position of the baby. REASONS FOR LABOR INDUCTION  The health of the baby or mother is at risk.  The pregnancy is overdue by 1 week or more.  The water breaks but labor does not start on its own.  The mother  has a health condition or serious illness such as high blood pressure, infection, placental abruption, or diabetes.  The amniotic fluid amounts are low around the baby.  The baby is distressed. REASONS TO NOT INDUCE LABOR Labor induction may not be a good idea if:  It is shown that your baby does not tolerate labor.  An induction is just more convenient.  You want the baby to be born on a certain date, like a holiday.  You have had previous surgeries on your uterus, such as a myomectomy or the removal of fibroids.  Your placenta lies very low in the uterus and blocks the opening of the cervix (placenta previa).  Your baby is not in a head down position.  The umbilical  cord drops down into the birth canal in front of the baby. This could cut off the baby's blood and oxygen supply.  You have had a previous cesarean delivery.  There areunusual circumstances, such as the baby being extremely premature. RISKS AND COMPLICATIONS Problems may occur in the process of induction and plans may need to be modified as a situation unfolds. Some of the risks of induction include:  Change in fetal heart rate, such as too high, too low, or erratic.  Risk of fetal distress.  Risk of infection to mother and baby.  Increased chance of having a cesarean delivery.  The rare, but increased chance that the placenta will separate from the uterus (abruption).  Uterine rupture (very rare). When induction is needed for medical reasons, the benefits of induction may outweigh the risks. BEFORE THE PROCEDURE Your caregiver will check your cervix and the baby's position. This will help your caregiver decide if you are far enough along for an induction to work. PROCEDURE Several methods of labor induction may be used, such as:   Taking prostaglandin medicine to dilate and ripen the cervix. The medicine will also start contractions. It can be taken by mouth or by inserting a suppository into the vagina.  A thin tube (catheter) with a balloon on the end may be inserted into your vagina to dilate the cervix. Once inserted, the balloon expands with water, which causes the cervix to open.  Striping the membranes. Your caregiver inserts a finger between the cervix and membranes, which causes the cervix to be stretched and may cause the uterus to contract. This is often done during an office visit. You will be sent home to wait for the contractions to begin. You will then come in for an induction.  Breaking the water. Your caregiver will make a hole in the amniotic sac using a small instrument. Once the amniotic sac breaks, contractions should begin. This may still take hours to see an  effect.  Taking medicine to trigger or strengthen contractions. This medicine is given intravenously through a tube in your arm. All of the methods of induction, besides stripping the membranes, will be done in the hospital. Induction is done in the hospital so that you and the baby can be carefully monitored. AFTER THE PROCEDURE Some inductions can take up to 2 or 3 days. Depending on the cervix, it usually takes less time. It takes longer when you are induced early in the pregnancy or if this is your first pregnancy. If a mother is still pregnant and the induction has been going on for 2 to 3 days, either the mother will be sent home or a cesarean delivery will be needed. Document Released: 11/28/2006 Document Revised: 10/01/2011 Document Reviewed: 05/14/2011 [employer]  Patient Information 2013 [employer].  

## 2012-08-28 ENCOUNTER — Ambulatory Visit (INDEPENDENT_AMBULATORY_CARE_PROVIDER_SITE_OTHER): Payer: BC Managed Care – PPO | Admitting: *Deleted

## 2012-08-28 DIAGNOSIS — O99810 Abnormal glucose complicating pregnancy: Secondary | ICD-10-CM

## 2012-08-28 DIAGNOSIS — O480 Post-term pregnancy: Secondary | ICD-10-CM

## 2012-08-28 NOTE — Progress Notes (Signed)
Patient is here for NST only she is scheduled for induction on the 8th.  She is doing well and baby is moving well.  NST is reactive today.  She is instructed to follow up in 6 weeks post delivery.

## 2012-08-30 ENCOUNTER — Inpatient Hospital Stay (HOSPITAL_COMMUNITY)
Admission: RE | Admit: 2012-08-30 | Discharge: 2012-09-02 | DRG: 651 | Disposition: A | Discharge status: Home / Self Care | Payer: BC Managed Care – PPO | Source: Ambulatory Visit | Attending: Obstetrics & Gynecology | Admitting: Obstetrics & Gynecology

## 2012-08-30 ENCOUNTER — Encounter (HOSPITAL_COMMUNITY): Payer: Self-pay

## 2012-08-30 VITALS — BP 121/66 | HR 76 | Temp 97.7°F | Resp 18 | Ht 68.0 in | Wt 231.0 lb

## 2012-08-30 DIAGNOSIS — O0990 Supervision of high risk pregnancy, unspecified, unspecified trimester: Secondary | ICD-10-CM

## 2012-08-30 DIAGNOSIS — O2432 Unspecified pre-existing diabetes mellitus in childbirth: Principal | ICD-10-CM | POA: Diagnosis present

## 2012-08-30 DIAGNOSIS — O409XX0 Polyhydramnios, unspecified trimester, not applicable or unspecified: Secondary | ICD-10-CM | POA: Diagnosis present

## 2012-08-30 DIAGNOSIS — D259 Leiomyoma of uterus, unspecified: Secondary | ICD-10-CM

## 2012-08-30 DIAGNOSIS — O3660X0 Maternal care for excessive fetal growth, unspecified trimester, not applicable or unspecified: Secondary | ICD-10-CM | POA: Diagnosis present

## 2012-08-30 DIAGNOSIS — Z98891 History of uterine scar from previous surgery: Secondary | ICD-10-CM

## 2012-08-30 DIAGNOSIS — O24419 Gestational diabetes mellitus in pregnancy, unspecified control: Secondary | ICD-10-CM

## 2012-08-30 DIAGNOSIS — E119 Type 2 diabetes mellitus without complications: Secondary | ICD-10-CM | POA: Diagnosis present

## 2012-08-30 DIAGNOSIS — IMO0002 Reserved for concepts with insufficient information to code with codable children: Secondary | ICD-10-CM

## 2012-08-30 DIAGNOSIS — O09529 Supervision of elderly multigravida, unspecified trimester: Secondary | ICD-10-CM | POA: Diagnosis present

## 2012-08-30 LAB — GLUCOSE, CAPILLARY: Glucose-Capillary: 93 mg/dL (ref 70–99)

## 2012-08-30 LAB — CBC
MCV: 83.3 fL (ref 78.0–100.0)
WBC: 7.3 10*3/uL (ref 4.0–10.5)

## 2012-08-30 LAB — TYPE AND SCREEN
ABO/RH(D): O POS
Antibody Screen: NEGATIVE

## 2012-08-30 LAB — RPR: RPR Ser Ql: NONREACTIVE

## 2012-08-30 MED ORDER — MISOPROSTOL 25 MCG QUARTER TABLET
25.0000 ug | ORAL_TABLET | ORAL | Status: DC | PRN
  Administered 2012-08-30 (×2): 25 ug via VAGINAL
  Filled 2012-08-30 (×2): qty 0.25

## 2012-08-30 MED ORDER — LIDOCAINE HCL (PF) 1 % IJ SOLN: 30.0000 mL | INTRAMUSCULAR | Status: DC | PRN

## 2012-08-30 MED ORDER — IBUPROFEN 600 MG PO TABS: 600.0000 mg | ORAL_TABLET | Freq: Four times a day (QID) | ORAL | Status: DC | PRN

## 2012-08-30 MED ORDER — FENTANYL CITRATE 0.05 MG/ML IJ SOLN
100.0000 ug | INTRAMUSCULAR | Status: DC | PRN
  Administered 2012-08-30 (×2): 100 ug via INTRAVENOUS
  Filled 2012-08-30 (×2): qty 2

## 2012-08-30 MED ORDER — METFORMIN HCL 500 MG PO TABS
500.0000 mg | ORAL_TABLET | Freq: Two times a day (BID) | ORAL | Status: DC
  Filled 2012-08-30 (×2): qty 1

## 2012-08-30 MED ORDER — OXYTOCIN 40 UNITS IN LACTATED RINGERS INFUSION - SIMPLE MED
62.5000 mL/h | INTRAVENOUS | Status: DC
  Filled 2012-08-30: qty 1000

## 2012-08-30 MED ORDER — ONDANSETRON HCL 4 MG/2ML IJ SOLN: 4.0000 mg | Freq: Four times a day (QID) | INTRAMUSCULAR | Status: DC | PRN

## 2012-08-30 MED ORDER — FLEET ENEMA 7-19 GM/118ML RE ENEM: 1.0000 | ENEMA | RECTAL | Status: DC | PRN

## 2012-08-30 MED ORDER — HYDROXYZINE HCL 50 MG/ML IM SOLN: 50.0000 mg | Freq: Four times a day (QID) | INTRAMUSCULAR | Status: DC | PRN

## 2012-08-30 MED ORDER — ACETAMINOPHEN 325 MG PO TABS: 650.0000 mg | ORAL_TABLET | ORAL | Status: DC | PRN

## 2012-08-30 MED ORDER — OXYTOCIN BOLUS FROM INFUSION: 500.0000 mL | INTRAVENOUS | Status: DC

## 2012-08-30 MED ORDER — HYDROXYZINE HCL 50 MG PO TABS: 50.0000 mg | ORAL_TABLET | Freq: Four times a day (QID) | ORAL | Status: DC | PRN

## 2012-08-30 MED ORDER — OXYTOCIN 40 UNITS IN LACTATED RINGERS INFUSION - SIMPLE MED
1.0000 m[IU]/min | INTRAVENOUS | Status: DC
  Administered 2012-08-31: 8 m[IU]/min via INTRAVENOUS
  Administered 2012-08-31: 14 m[IU]/min via INTRAVENOUS
  Administered 2012-08-31: 18 m[IU]/min via INTRAVENOUS
  Administered 2012-08-31: 2 m[IU]/min via INTRAVENOUS
  Administered 2012-08-31: 20 m[IU]/min via INTRAVENOUS
  Administered 2012-08-31: 12 m[IU]/min via INTRAVENOUS
  Administered 2012-08-31: 10 m[IU]/min via INTRAVENOUS
  Administered 2012-08-31: 16 m[IU]/min via INTRAVENOUS
  Administered 2012-08-31: 6 m[IU]/min via INTRAVENOUS
  Administered 2012-08-31: 22 m[IU]/min via INTRAVENOUS

## 2012-08-30 MED ORDER — LACTATED RINGERS IV SOLN
INTRAVENOUS | Status: DC
  Administered 2012-08-30 – 2012-08-31 (×5): via INTRAVENOUS

## 2012-08-30 MED ORDER — CITRIC ACID-SODIUM CITRATE 334-500 MG/5ML PO SOLN
30.0000 mL | ORAL | Status: DC | PRN
  Administered 2012-08-31: 30 mL via ORAL
  Filled 2012-08-30 (×2): qty 15

## 2012-08-30 MED ORDER — LACTATED RINGERS IV SOLN
500.0000 mL | INTRAVENOUS | Status: DC | PRN
  Administered 2012-08-31: 500 mL via INTRAVENOUS

## 2012-08-30 MED ORDER — TERBUTALINE SULFATE 1 MG/ML IJ SOLN: 0.2500 mg | Freq: Once | INTRAMUSCULAR | Status: AC | PRN

## 2012-08-30 MED ORDER — OXYTOCIN 40 UNITS IN LACTATED RINGERS INFUSION - SIMPLE MED
1.0000 m[IU]/min | INTRAVENOUS | Status: DC
  Administered 2012-08-30: 2 m[IU]/min via INTRAVENOUS

## 2012-08-30 NOTE — H&P (Signed)
Attestation of Attending Supervision of Advanced Practitioner (CNM/NP): Evaluation and management procedures were performed by the Advanced Practitioner under my supervision and collaboration.  I have reviewed the Advanced Practitioner's note and chart, and I agree with the management and plan.  HARRAWAY-SMITH, Jose Alleyne 12:51 PM

## 2012-08-30 NOTE — H&P (Signed)
Priscilla King is a 37 y.o. G2P0010 female at [redacted]w[redacted]d by LMP which correlates well w/ 12.4wk u/s, presenting for IOL d/t A2DM on metformin 500mg  bid. Initiated prenatal care at [facility] clinic in [place] at approximately 9 weeks, then transferred care to [facility] at [place].  Early 1hr glucola 136 w/ normal 3hr. Repeat 1hr 187 and pt declined 3hr- wanted to be treated as having GDM.  U/S on 1/30: vtx, polyhydramnios 27.65cm, efw 8lb4oz/3736g/>90%.  H/O PCOS and uterine fibroids.  Harmony normal 46XY, anatomy scan normal, GBS neg.    Maternal Medical History:  Reason for admission: IOL d/t A2DM- metformin  Fetal activity: Perceived fetal activity is normal.   Last perceived fetal movement was within the past hour.    Prenatal complications: no prenatal complications Prenatal Complications - Diabetes: type 2. Diabetes is managed by oral agent (monotherapy).      OB History   Grav Para Term Preterm Abortions TAB SAB Ect Mult Living   2    1 1          Past Medical History  Diagnosis Date  . ESR raised     elevated  . Achilles tendinosis     B, calcific tendonitis  . 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 History of polycystic ovaries   . Elevated blood sugar   . Gestational diabetes     metformin   Past Surgical History  Procedure Laterality Date  . No past surgeries     Family History: family history includes Diabetes in her maternal grandmother, mother, and paternal grandmother; Hypertension in her maternal grandmother and paternal grandmother; and Stroke in her mother. Social History:  reports that she has never smoked. She has never used smokeless tobacco. She reports that she does not drink alcohol or use illicit drugs.   Prenatal Transfer Tool  Maternal Diabetes: Yes:  Diabetes Type:  Insulin/Medication controlled Genetic Screening: Normal Maternal Ultrasounds/Referrals: Abnormal:  Findings:   Other:normal anatomy, polyhydramnios, LGA Fetal Ultrasounds or  other Referrals:  None Maternal Substance Abuse:  No Significant Maternal Medications:  Meds include: Other:   metformin Significant Maternal Lab Results:  Lab values include: Group B Strep negative Other Comments:  None  Review of Systems  Constitutional: Negative.   HENT: Negative.   Eyes: Negative.   Respiratory: Negative.   Cardiovascular: Negative.   Gastrointestinal: Negative.   Genitourinary: Negative.   Musculoskeletal: Negative.   Skin: Negative.   Neurological: Negative.   Endo/Heme/Allergies: Negative.   Psychiatric/Behavioral: Negative.     Dilation: 1 Effacement (%): 50 Station: -3 Exam by:: [employer] Blood pressure 127/73, pulse 90, temperature 98 F (36.7 C), temperature source Oral, resp. rate 18, height 5\' 8"  (1.727 m), weight 104.781 kg (231 lb), last menstrual period 12/01/2011. Maternal Exam:  Uterine Assessment: Contraction strength is mild.  Contraction frequency is irregular.   Abdomen: Fetal presentation: vertex  Introitus: Normal vulva. Normal vagina.  Pelvis: adequate for delivery.   Cervix: Cervix evaluated by digital exam.     Fetal Exam Fetal Monitor Review: Mode: ultrasound.   Baseline rate: 145.  Variability: moderate (6-25 bpm).   Pattern: accelerations present and no decelerations.    Fetal State Assessment: Category I - tracings are normal.     Physical Exam  Constitutional: She is oriented to person, place, and time. She appears well-developed and well-nourished.  HENT:  Head: Normocephalic.  Neck: Normal range of motion.  Cardiovascular: Normal rate and regular rhythm.   Respiratory: Effort  normal and breath sounds normal.  GI: Soft. There is no tenderness.  Gravid, large d/t polyhydramnios and LGA  Genitourinary: Vagina normal and uterus normal.  SVE: outer os 1, unable to reach through to inner os/50/-3, posterior, vtx confirmed by bs u/s  Musculoskeletal: Normal range of motion.  Neurological: She is alert and oriented  to person, place, and time. She has normal reflexes.  Skin: Skin is warm and dry.  Psychiatric: She has a normal mood and affect. Her behavior is normal. Judgment and thought content normal.    Prenatal labs: ABO, Rh: O/Positive/-- (07/22 0950) Antibody: Negative (07/22 0950) Rubella: Immune (07/22 0950) RPR: NON REAC (11/14 1355)  HBsAg: Negative (07/22 0950)  HIV: NON REACTIVE (11/14 1355)  GBS: Negative (01/24 0000)  Early 1hr: 136 w/ normal 3hr Repeat 1hr: 187, declined 3hr  Assessment/Plan: A:  [redacted]w[redacted]d SIUP  IOL d/t A2DM   A2DM on metformin 500mg  bid  AMA  Polyhydramnios 27.65cm  LGA- EFW 3736gm >90%  GBS neg  Cat I FHR  P:  Admit to BS  Cytotec q 4hr pv until able to place cervical foley bulb  IV pain meds/epidural prn active labor  CBGs q4hr while in cervical ripening phase, q 2hrs in active labor- call >110  Metformin 500mg  bid po while eating light diet in cervical ripening phase  Anticipate NSVD  Marge Duncans 08/30/2012, 10:48 AM

## 2012-08-30 NOTE — Progress Notes (Signed)
Called [facility] CNM for epidural orders. CNM would like to wait until foley bulb falls out. IV pain medication orders are available.

## 2012-08-30 NOTE — Progress Notes (Signed)
IV pain Meds given. Fentanyl 

## 2012-08-30 NOTE — Progress Notes (Signed)
Priscilla King is a 37 y.o. G2P0010 at [redacted]w[redacted]d admitted for induction of labor due to A2DM.  Subjective: Very frustrated, feels like she 'is on autopilot'. Thought she would be managed by MDs that she saw at [facility] during pregnancy and that pitocin would be started immediately upon arrival and she would birth w/in 24hours of admission. Wants to be checked. Family supportive at bedside.  States she has been off of metformin for a week now b/c it made her sick.   Objective: BP 131/70  Pulse 93  Temp(Src) 98 F (36.7 C) (Oral)  Resp 18  Ht 5\' 8"  (1.727 m)  Wt 104.781 kg (231 lb)  BMI 35.13 kg/m2  LMP 12/01/2011      FHT:  FHR: 145 bpm, variability: moderate,  accelerations:  Present,  decelerations:  Absent UC:   Mild, irregular SVE:   1/50/-2 to -3, still posterior but is beginning to move anterior enough for foley bulb attempt Cervical foley bulb inserted and inflated w/ 60ml LR w/o difficulty   Labs: Lab Results  Component Value Date   WBC 7.3 08/30/2012   HGB 11.1* 08/30/2012   HCT 34.0* 08/30/2012   MCV 83.3 08/30/2012   PLT 226 08/30/2012    Assessment / Plan: Again reiterated that IOL can be a very long process, educated again about IOL process and what to expect IOL d/t A2DM, s/p 2 doses of cytotec, cervical foley bulb now in place, will begin pitocin not to exceed 45mu/min while foley bulb in place.  After foley bulb falls out, increase pitocin per protocol to achieve adequate labor/dilation Clear liquid diet.  Labor: cervical ripening phase Preeclampsia:  n/a Fetal Wellbeing:  Category I Pain Control:  n/a I/D:  n/a Anticipated MOD:  NSVD  Marge Duncans 08/30/2012, 6:08 PM

## 2012-08-31 ENCOUNTER — Encounter (HOSPITAL_COMMUNITY)
Admission: RE | Disposition: A | Discharge status: Home / Self Care | Payer: Self-pay | Source: Ambulatory Visit | Attending: Obstetrics & Gynecology

## 2012-08-31 ENCOUNTER — Encounter (HOSPITAL_COMMUNITY): Payer: Self-pay | Admitting: Anesthesiology

## 2012-08-31 ENCOUNTER — Inpatient Hospital Stay (HOSPITAL_COMMUNITY): Payer: BC Managed Care – PPO | Admitting: Anesthesiology

## 2012-08-31 ENCOUNTER — Encounter (HOSPITAL_COMMUNITY): Payer: Self-pay

## 2012-08-31 DIAGNOSIS — E119 Type 2 diabetes mellitus without complications: Secondary | ICD-10-CM

## 2012-08-31 DIAGNOSIS — O409XX0 Polyhydramnios, unspecified trimester, not applicable or unspecified: Secondary | ICD-10-CM

## 2012-08-31 DIAGNOSIS — O2432 Unspecified pre-existing diabetes mellitus in childbirth: Secondary | ICD-10-CM

## 2012-08-31 LAB — GLUCOSE, CAPILLARY
Glucose-Capillary: 107 mg/dL — ABNORMAL HIGH (ref 70–99)
Glucose-Capillary: 92 mg/dL (ref 70–99)
Glucose-Capillary: 113 mg/dL — ABNORMAL HIGH (ref 70–99)

## 2012-08-31 SURGERY — Surgical Case
Anesthesia: Epidural | Wound class: Clean Contaminated

## 2012-08-31 MED ORDER — NALOXONE HCL 0.4 MG/ML IJ SOLN: 0.4000 mg | INTRAMUSCULAR | Status: DC | PRN

## 2012-08-31 MED ORDER — EPHEDRINE 5 MG/ML INJ: 10.0000 mg | INTRAVENOUS | Status: DC | PRN

## 2012-08-31 MED ORDER — DIPHENHYDRAMINE HCL 25 MG PO CAPS: 25.0000 mg | ORAL_CAPSULE | ORAL | Status: DC | PRN

## 2012-08-31 MED ORDER — KETOROLAC TROMETHAMINE 30 MG/ML IJ SOLN: 30.0000 mg | Freq: Four times a day (QID) | INTRAMUSCULAR | Status: AC | PRN

## 2012-08-31 MED ORDER — FENTANYL 2.5 MCG/ML BUPIVACAINE 1/10 % EPIDURAL INFUSION (WH - ANES): INTRAMUSCULAR | Status: DC | PRN

## 2012-08-31 MED ORDER — CEFAZOLIN SODIUM-DEXTROSE 2-3 GM-% IV SOLR
INTRAVENOUS | Status: DC | PRN
  Administered 2012-08-31: 2 g via INTRAVENOUS

## 2012-08-31 MED ORDER — DIPHENHYDRAMINE HCL 50 MG/ML IJ SOLN: 12.5000 mg | INTRAMUSCULAR | Status: DC | PRN

## 2012-08-31 MED ORDER — FENTANYL 2.5 MCG/ML BUPIVACAINE 1/10 % EPIDURAL INFUSION (WH - ANES)
14.0000 mL/h | INTRAMUSCULAR | Status: DC
  Administered 2012-08-31 (×2): 14 mL/h via EPIDURAL
  Filled 2012-08-31 (×3): qty 125

## 2012-08-31 MED ORDER — PHENYLEPHRINE 40 MCG/ML (10ML) SYRINGE FOR IV PUSH (FOR BLOOD PRESSURE SUPPORT)
PREFILLED_SYRINGE | INTRAVENOUS | Status: AC
  Filled 2012-08-31: qty 5

## 2012-08-31 MED ORDER — KETOROLAC TROMETHAMINE 60 MG/2ML IM SOLN
INTRAMUSCULAR | Status: AC
  Administered 2012-08-31: 60 mg via INTRAMUSCULAR
  Filled 2012-08-31: qty 2

## 2012-08-31 MED ORDER — SIMETHICONE 80 MG PO CHEW
80.0000 mg | CHEWABLE_TABLET | ORAL | Status: DC | PRN
  Administered 2012-09-01: 80 mg via ORAL

## 2012-08-31 MED ORDER — MEPERIDINE HCL 25 MG/ML IJ SOLN
6.2500 mg | INTRAMUSCULAR | Status: DC | PRN
  Administered 2012-08-31: 6.25 mg via INTRAVENOUS

## 2012-08-31 MED ORDER — ONDANSETRON HCL 4 MG/2ML IJ SOLN: 4.0000 mg | Freq: Three times a day (TID) | INTRAMUSCULAR | Status: DC | PRN

## 2012-08-31 MED ORDER — DIBUCAINE 1 % RE OINT: 1.0000 "application " | TOPICAL_OINTMENT | RECTAL | Status: DC | PRN

## 2012-08-31 MED ORDER — NALBUPHINE HCL 10 MG/ML IJ SOLN: 5.0000 mg | INTRAMUSCULAR | Status: DC | PRN

## 2012-08-31 MED ORDER — WITCH HAZEL-GLYCERIN EX PADS: 1.0000 "application " | MEDICATED_PAD | CUTANEOUS | Status: DC | PRN

## 2012-08-31 MED ORDER — METOCLOPRAMIDE HCL 5 MG/ML IJ SOLN
10.0000 mg | Freq: Once | INTRAMUSCULAR | Status: AC
  Administered 2012-08-31: 10 mg via INTRAVENOUS
  Filled 2012-08-31: qty 2

## 2012-08-31 MED ORDER — LACTATED RINGERS IV SOLN: INTRAVENOUS | Status: DC

## 2012-08-31 MED ORDER — CEFAZOLIN SODIUM-DEXTROSE 2-3 GM-% IV SOLR
INTRAVENOUS | Status: AC
  Filled 2012-08-31: qty 50

## 2012-08-31 MED ORDER — OXYTOCIN 10 UNIT/ML IJ SOLN
40.0000 [IU] | INTRAVENOUS | Status: DC | PRN
  Administered 2012-08-31: 40 [IU] via INTRAVENOUS

## 2012-08-31 MED ORDER — LIDOCAINE-EPINEPHRINE (PF) 2 %-1:200000 IJ SOLN
INTRAMUSCULAR | Status: AC
  Filled 2012-08-31: qty 20

## 2012-08-31 MED ORDER — LIDOCAINE HCL (PF) 1 % IJ SOLN
INTRAMUSCULAR | Status: DC | PRN
  Administered 2012-08-31: 9 mL
  Administered 2012-08-31: 8 mL

## 2012-08-31 MED ORDER — OXYTOCIN 10 UNIT/ML IJ SOLN
INTRAMUSCULAR | Status: AC
  Filled 2012-08-31: qty 4

## 2012-08-31 MED ORDER — MEPERIDINE HCL 25 MG/ML IJ SOLN
INTRAMUSCULAR | Status: AC
  Administered 2012-08-31: 6.25 mg via INTRAVENOUS
  Filled 2012-08-31: qty 1

## 2012-08-31 MED ORDER — MENTHOL 3 MG MT LOZG: 1.0000 | LOZENGE | OROMUCOSAL | Status: DC | PRN

## 2012-08-31 MED ORDER — PRENATAL MULTIVITAMIN CH
1.0000 | ORAL_TABLET | Freq: Every day | ORAL | Status: DC
  Filled 2012-08-31 (×2): qty 1

## 2012-08-31 MED ORDER — OXYTOCIN 40 UNITS IN LACTATED RINGERS INFUSION - SIMPLE MED: 62.5000 mL/h | INTRAVENOUS | Status: AC

## 2012-08-31 MED ORDER — SODIUM BICARBONATE 8.4 % IV SOLN
INTRAVENOUS | Status: AC
  Filled 2012-08-31: qty 50

## 2012-08-31 MED ORDER — SCOPOLAMINE 1 MG/3DAYS TD PT72
MEDICATED_PATCH | TRANSDERMAL | Status: AC
  Administered 2012-08-31: 1.5 mg via TRANSDERMAL
  Filled 2012-08-31: qty 1

## 2012-08-31 MED ORDER — FENTANYL 2.5 MCG/ML BUPIVACAINE 1/10 % EPIDURAL INFUSION (WH - ANES)
INTRAMUSCULAR | Status: DC | PRN
  Administered 2012-08-31: 14 mL/h via EPIDURAL

## 2012-08-31 MED ORDER — PHENYLEPHRINE HCL 10 MG/ML IJ SOLN
INTRAMUSCULAR | Status: DC | PRN
  Administered 2012-08-31: 80 ug via INTRAVENOUS

## 2012-08-31 MED ORDER — ONDANSETRON HCL 4 MG/2ML IJ SOLN: 4.0000 mg | INTRAMUSCULAR | Status: DC | PRN

## 2012-08-31 MED ORDER — PHENYLEPHRINE 40 MCG/ML (10ML) SYRINGE FOR IV PUSH (FOR BLOOD PRESSURE SUPPORT)
80.0000 ug | PREFILLED_SYRINGE | INTRAVENOUS | Status: DC | PRN
  Filled 2012-08-31: qty 5

## 2012-08-31 MED ORDER — MORPHINE SULFATE 0.5 MG/ML IJ SOLN
INTRAMUSCULAR | Status: AC
  Filled 2012-08-31: qty 10

## 2012-08-31 MED ORDER — ONDANSETRON HCL 4 MG/2ML IJ SOLN
INTRAMUSCULAR | Status: DC | PRN
  Administered 2012-08-31: 4 mg via INTRAVENOUS

## 2012-08-31 MED ORDER — NALOXONE HCL 1 MG/ML IJ SOLN: 1.0000 ug/kg/h | INTRAVENOUS | Status: DC | PRN

## 2012-08-31 MED ORDER — SODIUM BICARBONATE 8.4 % IV SOLN
INTRAVENOUS | Status: DC | PRN
  Administered 2012-08-31: 5 mL via EPIDURAL

## 2012-08-31 MED ORDER — CEFAZOLIN SODIUM-DEXTROSE 2-3 GM-% IV SOLR
2.0000 g | INTRAVENOUS | Status: DC
  Filled 2012-08-31: qty 50

## 2012-08-31 MED ORDER — DIPHENHYDRAMINE HCL 50 MG/ML IJ SOLN: 25.0000 mg | INTRAMUSCULAR | Status: DC | PRN

## 2012-08-31 MED ORDER — LACTATED RINGERS IV SOLN: 500.0000 mL | Freq: Once | INTRAVENOUS | Status: DC

## 2012-08-31 MED ORDER — TETANUS-DIPHTH-ACELL PERTUSSIS 5-2.5-18.5 LF-MCG/0.5 IM SUSP: 0.5000 mL | Freq: Once | INTRAMUSCULAR | Status: DC

## 2012-08-31 MED ORDER — MORPHINE SULFATE (PF) 0.5 MG/ML IJ SOLN
INTRAMUSCULAR | Status: DC | PRN
  Administered 2012-08-31: 4 mg via EPIDURAL

## 2012-08-31 MED ORDER — METOCLOPRAMIDE HCL 5 MG/ML IJ SOLN
10.0000 mg | Freq: Three times a day (TID) | INTRAMUSCULAR | Status: DC | PRN
  Administered 2012-08-31: 10 mg via INTRAVENOUS
  Filled 2012-08-31: qty 2

## 2012-08-31 MED ORDER — ONDANSETRON HCL 4 MG PO TABS: 4.0000 mg | ORAL_TABLET | ORAL | Status: DC | PRN

## 2012-08-31 MED ORDER — EPHEDRINE 5 MG/ML INJ
10.0000 mg | INTRAVENOUS | Status: DC | PRN
  Filled 2012-08-31: qty 4

## 2012-08-31 MED ORDER — SIMETHICONE 80 MG PO CHEW
80.0000 mg | CHEWABLE_TABLET | Freq: Three times a day (TID) | ORAL | Status: DC
  Administered 2012-09-01 – 2012-09-02 (×4): 80 mg via ORAL

## 2012-08-31 MED ORDER — IBUPROFEN 600 MG PO TABS
600.0000 mg | ORAL_TABLET | Freq: Four times a day (QID) | ORAL | Status: DC
  Administered 2012-09-01 – 2012-09-02 (×3): 600 mg via ORAL
  Filled 2012-08-31 (×5): qty 1

## 2012-08-31 MED ORDER — DIPHENHYDRAMINE HCL 25 MG PO CAPS: 25.0000 mg | ORAL_CAPSULE | Freq: Four times a day (QID) | ORAL | Status: DC | PRN

## 2012-08-31 MED ORDER — SCOPOLAMINE 1 MG/3DAYS TD PT72
1.0000 | MEDICATED_PATCH | Freq: Once | TRANSDERMAL | Status: DC
  Administered 2012-08-31: 1.5 mg via TRANSDERMAL

## 2012-08-31 MED ORDER — KETOROLAC TROMETHAMINE 60 MG/2ML IM SOLN
60.0000 mg | Freq: Once | INTRAMUSCULAR | Status: AC | PRN
  Administered 2012-08-31: 60 mg via INTRAMUSCULAR

## 2012-08-31 MED ORDER — IBUPROFEN 600 MG PO TABS: 600.0000 mg | ORAL_TABLET | Freq: Four times a day (QID) | ORAL | Status: DC | PRN

## 2012-08-31 MED ORDER — FENTANYL CITRATE 0.05 MG/ML IJ SOLN: 25.0000 ug | INTRAMUSCULAR | Status: DC | PRN

## 2012-08-31 MED ORDER — ONDANSETRON HCL 4 MG/2ML IJ SOLN
INTRAMUSCULAR | Status: AC
  Filled 2012-08-31: qty 2

## 2012-08-31 MED ORDER — SODIUM CHLORIDE 0.9 % IJ SOLN: 3.0000 mL | INTRAMUSCULAR | Status: DC | PRN

## 2012-08-31 MED ORDER — PHENYLEPHRINE 40 MCG/ML (10ML) SYRINGE FOR IV PUSH (FOR BLOOD PRESSURE SUPPORT): 80.0000 ug | PREFILLED_SYRINGE | INTRAVENOUS | Status: DC | PRN

## 2012-08-31 MED ORDER — LACTATED RINGERS IV SOLN
INTRAVENOUS | Status: DC | PRN
  Administered 2012-08-31: 19:00:00 via INTRAVENOUS

## 2012-08-31 MED ORDER — LANOLIN HYDROUS EX OINT: 1.0000 "application " | TOPICAL_OINTMENT | CUTANEOUS | Status: DC | PRN

## 2012-08-31 MED ORDER — SENNOSIDES-DOCUSATE SODIUM 8.6-50 MG PO TABS
2.0000 | ORAL_TABLET | Freq: Every day | ORAL | Status: DC
  Administered 2012-09-01: 2 via ORAL

## 2012-08-31 MED ORDER — LACTATED RINGERS IV SOLN
INTRAVENOUS | Status: DC | PRN
  Administered 2012-08-31 (×2): via INTRAVENOUS

## 2012-08-31 SURGICAL SUPPLY — 30 items
CONTAINER PREFILL 10% NBF 15ML (MISCELLANEOUS) IMPLANT
DRAPE LG THREE QUARTER DISP (DRAPES) ×2 IMPLANT
DRESSING TELFA 8X3 (GAUZE/BANDAGES/DRESSINGS) IMPLANT
DRSG OPSITE POSTOP 4X10 (GAUZE/BANDAGES/DRESSINGS) ×2 IMPLANT
DRSG OPSITE POSTOP 4X12 (GAUZE/BANDAGES/DRESSINGS) ×2 IMPLANT
DURAPREP 26ML APPLICATOR (WOUND CARE) ×2 IMPLANT
ELECT REM PT RETURN 9FT ADLT (ELECTROSURGICAL) ×2
ELECTRODE REM PT RTRN 9FT ADLT (ELECTROSURGICAL) ×1 IMPLANT
EXTRACTOR VACUUM M CUP 4 TUBE (SUCTIONS) IMPLANT
GAUZE SPONGE 4X4 12PLY STRL LF (GAUZE/BANDAGES/DRESSINGS) IMPLANT
GLOVE BIOGEL PI IND STRL 6.5 (GLOVE) ×1 IMPLANT
GLOVE BIOGEL PI INDICATOR 6.5 (GLOVE) ×1
GLOVE SURG SS PI 6.0 STRL IVOR (GLOVE) ×2 IMPLANT
GOWN PREVENTION PLUS LG XLONG (DISPOSABLE) ×6 IMPLANT
KIT ABG SYR 3ML LUER SLIP (SYRINGE) IMPLANT
NEEDLE HYPO 25X5/8 SAFETYGLIDE (NEEDLE) IMPLANT
NS IRRIG 1000ML POUR BTL (IV SOLUTION) ×2 IMPLANT
PACK C SECTION WH (CUSTOM PROCEDURE TRAY) ×2 IMPLANT
PAD ABD 7.5X8 STRL (GAUZE/BANDAGES/DRESSINGS) IMPLANT
PAD OB MATERNITY 4.3X12.25 (PERSONAL CARE ITEMS) ×2 IMPLANT
RTRCTR C-SECT PINK 25CM LRG (MISCELLANEOUS) ×2 IMPLANT
SEPRAFILM MEMBRANE 5X6 (MISCELLANEOUS) IMPLANT
SLEEVE SCD COMPRESS KNEE MED (MISCELLANEOUS) IMPLANT
STAPLER VISISTAT 35W (STAPLE) ×2 IMPLANT
SUT PLAIN 0 NONE (SUTURE) IMPLANT
SUT VIC AB 0 CT1 36 (SUTURE) ×8 IMPLANT
TOWEL OR 17X24 6PK STRL BLUE (TOWEL DISPOSABLE) ×6 IMPLANT
TRAY FOLEY CATH 14FR (SET/KITS/TRAYS/PACK) IMPLANT
TUBING CONNECTING 10 (TUBING) ×2 IMPLANT
WATER STERILE IRR 1000ML POUR (IV SOLUTION) ×2 IMPLANT

## 2012-08-31 NOTE — Progress Notes (Signed)
Subjective: Pt doing well with epidural, not uncomfortable and not feeling increased abdominal pressure   Objective: BP 127/76  Pulse 90  Temp(Src) 98.8 F (37.1 C) (Oral)  Resp 18  Ht 5\' 8"  (1.727 m)  Wt 104.781 kg (231 lb)  BMI 35.13 kg/m2  SpO2 100%  LMP 12/01/2011   Total I/O In: -  Out: 600 [Urine:600]  FHT:  FHR: 125 bpm, variability: moderate,  accelerations:  Present,  decelerations:  Absent UC:   regular, every 3-5 minutes SVE:   Dilation: 7 Effacement (%): 70 Station: -1 Exam by:: B.Smith RN  Labs: Lab Results  Component Value Date   WBC 7.3 08/30/2012   HGB 11.1* 08/30/2012   HCT 34.0* 08/30/2012   MCV 83.3 08/30/2012   PLT 226 08/30/2012    Assessment / Plan: IOL d/t A2DM, pitocin at 20 right now, will continue to try and increase pending toleration of baby  Labor: Prolonged Active Phase Preeclampsia:  N/A Fetal Wellbeing:  Category II Pain Control:  Epidural I/D:  n/a Anticipated MOD:  NSVD  Branko Steeves R. Paulina Fusi, DO of [facility] 08/31/2012, 3:38 PM

## 2012-08-31 NOTE — Progress Notes (Signed)
Priscilla King is a 37 y.o. G2P0010 at [redacted]w[redacted]d admitted for IOL d/t A2DM  Subjective: Comfortable w/ epidural, no complaints. FOB supportive at bs.   Objective: BP 115/75  Pulse 87  Temp(Src) 98.2 F (36.8 C) (Oral)  Resp 18  Ht 5\' 8"  (1.727 m)  Wt 104.781 kg (231 lb)  BMI 35.13 kg/m2  SpO2 100%  LMP 12/01/2011      FHT:  FHR: 135 bpm, variability: moderate,  accelerations:  Present,  decelerations:  Absent UC:   irregular, every 2-5 minutes, pitocin previously turned off by rn d/t lates  SVE:   Dilation: 6 Effacement (%): 70 Station: -2 Exam by:: K.Lynell Kussman,CNM BBOW, pos scalp stim AROM copious amount clear fluid, approximately 3L  Labs: Lab Results  Component Value Date   WBC 7.3 08/30/2012   HGB 11.1* 08/30/2012   HCT 34.0* 08/30/2012   MCV 83.3 08/30/2012   PLT 226 08/30/2012    Assessment / Plan: IOL d/t A2DM, pitocin previoulsy turned off d/t decels, now arom'd Will observe for better labor pattern w/ arom, restart pitocin per protocol if needed  Labor: early phase Preeclampsia:  n/a Fetal Wellbeing:  Category I Pain Control:  Epidural I/D:  n/a Anticipated MOD:  NSVD  Marge Duncans 08/31/2012, 5:46 AM

## 2012-08-31 NOTE — Anesthesia Procedure Notes (Signed)
Epidural Patient location during procedure: OB Start time: 08/31/2012 1:08 AM End time: 08/31/2012 1:13 AM  Staffing Anesthesiologist: Sandrea Hughs Performed by: anesthesiologist   Preanesthetic Checklist Completed: patient identified, site marked, surgical consent, pre-op evaluation, timeout performed, IV checked, risks and benefits discussed and monitors and equipment checked  Epidural Patient position: sitting Prep: site prepped and draped and DuraPrep Patient monitoring: continuous pulse ox and blood pressure Approach: midline Injection technique: LOR air  Needle:  Needle type: Tuohy  Needle gauge: 17 G Needle length: 9 cm and 9 Needle insertion depth: 7 cm Catheter type: closed end flexible Catheter size: 19 Gauge Catheter at skin depth: 12 cm Test dose: negative and Other  Assessment Sensory level: T9 Events: blood not aspirated, injection not painful, no injection resistance, negative IV test and no paresthesia  Additional Notes Reason for block:procedure for pain

## 2012-08-31 NOTE — Transfer of Care (Signed)
Immediate Anesthesia Transfer of Care Note  Patient: Priscilla King  Procedure(s) Performed: Procedure(s): CESAREAN SECTION (N/A)  Patient Location: PACU  Anesthesia Type:Epidural  Level of Consciousness: awake  Airway & Oxygen Therapy: Patient Spontanous Breathing  Post-op Assessment: Report given to PACU RN and Post -op Vital signs reviewed and stable  Post vital signs: stable  Complications: No apparent anesthesia complications

## 2012-08-31 NOTE — Progress Notes (Signed)
Subjective: Pt doing well with epidural, no complaints at this time.   Objective: BP 111/59  Pulse 85  Temp(Src) 98.3 F (36.8 C) (Oral)  Resp 18  Ht 5\' 8"  (1.727 m)  Wt 104.781 kg (231 lb)  BMI 35.13 kg/m2  SpO2 100%  LMP 12/01/2011      FHT:  FHR: 135 bpm, variability: moderate,  accelerations:  Present,  decelerations:  Absent UC:   regular, every 3-5 minutes SVE:   Dilation: 7 Effacement (%): 70 Station: -2 Exam by:: Flo Shanks RN  Labs: Lab Results  Component Value Date   WBC 7.3 08/30/2012   HGB 11.1* 08/30/2012   HCT 34.0* 08/30/2012   MCV 83.3 08/30/2012   PLT 226 08/30/2012    Assessment / Plan: IOL d/t A2DM, pitocin at 6 right now, will continue to try and increase pending toleration of baby's HR.   Labor: Progressing normally Preeclampsia:  N/A Fetal Wellbeing:  Category II Pain Control:  Epidural I/D:  n/a Anticipated MOD:  NSVD  Priscilla King R. Mcgwire Dasaro, DO of [facility] 08/31/2012, 10:02 AM

## 2012-08-31 NOTE — Progress Notes (Signed)
Patient ID: Priscilla King, female   DOB: [birth year], 37 y.o.   MRN: [id number] Patient comfortable with epidural.  SVE: 7/70/-1 EFM: baseline 150, moderate variability, no accels, pos late decels.  Tracing became reactive after discontinuation of pitocin infusion Toco: ctx q 3-5 minutes  A/P 37 yo G2P0010 at [redacted]w[redacted]d with DM-A2 - Patient with recurrent late decelaraion - Pitocin stopped - No cervical change despite adequate contractions - Discussed delivery via c-section, risks, benefits and alternatives were explained including but not limited to risks of bleeding, infection and damage to adjacent organs. Patient verbalized understanding and all questions were answered.

## 2012-08-31 NOTE — Op Note (Signed)
Priscilla King PROCEDURE DATE: 08/30/2012 - 08/31/2012  PREOPERATIVE DIAGNOSIS: Intrauterine pregnancy at  [redacted]w[redacted]d weeks gestation; non-reassuring fetal status  POSTOPERATIVE DIAGNOSIS: The same  PROCEDURE:     Cesarean Section  SURGEON:  Dr. Catalina Antigua  ASSISTANT:   INDICATIONS: Priscilla King is a 37 y.o. G2P0010 at [redacted]w[redacted]d scheduled for induction of labor secondary to A2-DM. On induction day #2,  Patient reached 7 cm of cervical dilation. Due to fetal intolerance, an adequate contraction pattern could not be achieved. Patient was counseled on need for cesarean section secondary to non-reassuring fetal status.  The risks of cesarean section discussed with the patient included but were not limited to: bleeding which may require transfusion or reoperation; infection which may require antibiotics; injury to bowel, bladder, ureters or other surrounding organs; injury to the fetus; need for additional procedures including hysterectomy in the event of a life-threatening hemorrhage; placental abnormalities wth subsequent pregnancies, incisional problems, thromboembolic phenomenon and other postoperative/anesthesia complications. The patient concurred with the proposed plan, giving informed written consent for the procedure.    FINDINGS:  Viable female infant in cephalic presentation.  Apgars 8 and 9.  Clear amniotic fluid.  Intact placenta, three vessel cord.  Normal uterus, fallopian tubes and ovaries bilaterally.  ANESTHESIA:    Spinal INTRAVENOUS FLUIDS:2200 ml ESTIMATED BLOOD LOSS: 800 ml URINE OUTPUT:  100 ml SPECIMENS: Placenta sent to L&D COMPLICATIONS: None immediate  PROCEDURE IN DETAIL:  The patient received intravenous antibiotics and had sequential compression devices applied to her lower extremities while in the preoperative area.  She was then taken to the operating room where anesthesia was induced and was found to be adequate. A foley catheter was placed into her bladder and attached to  Priscilla King gravity. She was then placed in a dorsal supine position with a leftward tilt, and prepped and draped in a sterile manner. After an adequate timeout was performed, a Pfannenstiel skin incision was made with scalpel and carried through to the underlying layer of fascia. The fascia was incised in the midline and this incision was extended bilaterally using the Mayo scissors. Kocher clamps were applied to the superior aspect of the fascial incision and the underlying rectus muscles were dissected off bluntly. A similar process was carried out on the inferior aspect of the facial incision. The rectus muscles were separated in the midline bluntly and the peritoneum was entered bluntly. The Alexis self-retaining retractor was introduced into the abdominal cavity. Attention was turned to the lower uterine segment where a bladder flap was created, and a transverse hysterotomy was made with a scalpel and extended bilaterally bluntly. The infant was successfully delivered, and cord was clamped and cut and infant was handed over to awaiting neonatology team. Uterine massage was then administered and the placenta delivered intact with three-vessel cord. The uterus was cleared of clot and debris.  The hysterotomy was closed with 0 Vicryl in a running locked fashion, and an imbricating layer was also placed with a 0 Vicryl. Overall, excellent hemostasis was noted. The pelvis copiously irrigated and cleared of all clot and debris. Hemostasis was confirmed on all surfaces.  The peritoneum and the muscles were reapproximated using 0 vicryl interrupted stitches. The fascia was then closed using 0 Vicryl in a running locked fashion.  The subcutaneous layer was reapproximated with plain gut and the skin was closed in a subcuticular fashion using 3.0 Vicryl. The patient tolerated the procedure well. Sponge, lap, instrument and needle counts were correct x 2. She was taken to  the recovery room in stable condition.     Priscilla King,PEGGYMD  08/31/2012 7:16 PM

## 2012-08-31 NOTE — Anesthesia Preprocedure Evaluation (Addendum)
Anesthesia Evaluation  Patient identified by MRN, date of birth, ID band Patient awake    Reviewed: Allergy & Precautions, H&P , NPO status , Patient's Chart, lab work & pertinent test results  Airway Mallampati: II TM Distance: >3 FB Neck ROM: full    Dental no notable dental hx.    Pulmonary neg pulmonary ROS,    Pulmonary exam normal       Cardiovascular negative cardio ROS      Neuro/Psych negative neurological ROS     GI/Hepatic negative GI ROS, Neg liver ROS,   Endo/Other  diabetes, Gestational, Oral Hypoglycemic AgentsMorbid obesity  Renal/GU negative Renal ROS  negative genitourinary   Musculoskeletal negative musculoskeletal ROS (+)   Abdominal (+) + obese,   Peds  Hematology negative hematology ROS (+)   Anesthesia Other Findings   Reproductive/Obstetrics (+) Pregnancy (fetal decelerations --> c/s)                          Anesthesia Physical Anesthesia Plan  ASA: III and emergent  Anesthesia Plan: Epidural   Post-op Pain Management:    Induction:   Airway Management Planned:   Additional Equipment:   Intra-op Plan:   Post-operative Plan:   Informed Consent: I have reviewed the patients History and Physical, chart, labs and discussed the procedure including the risks, benefits and alternatives for the proposed anesthesia with the patient or authorized representative who has indicated his/her understanding and acceptance.     Plan Discussed with: Surgeon and CRNA  Anesthesia Plan Comments:        Anesthesia Quick Evaluation

## 2012-08-31 NOTE — Anesthesia Postprocedure Evaluation (Signed)
Anesthesia Post Note  Patient: Priscilla King  Procedure(s) Performed: Procedure(s) (LRB): CESAREAN SECTION (N/A)  Anesthesia type: Epidural  Patient location: PACU  Post pain: Pain level controlled  Post assessment: Post-op Vital signs reviewed  Last Vitals:  Filed Vitals:   08/31/12 1945  BP: 122/94  Pulse: 99  Temp:   Resp: 20    Post vital signs: Reviewed  Level of consciousness: awake  Complications: No apparent anesthesia complications

## 2012-08-31 NOTE — Progress Notes (Signed)
Encouraged pt to turn on sides for fetal heart rate, pt states " My baby does not like my sides"

## 2012-08-31 NOTE — Consult Note (Signed)
Neonatology Note:  Attendance at C-section:  I was asked to attend this primary C/S at term due to NRFHR. The mother is a G2P0A1 O pos, GBS neg with GDM on Metformin, polyhydramnios, and known LGA infant. ROM 13 hours prior to delivery, fluid clear. Infant vigorous with good spontaneous cry and tone. Needed only minimal bulb suctioning. Ap 8/9. Lungs clear to ausc in DR. To CN to care of Pediatrician.  Romel Dumond, MD  

## 2012-08-31 NOTE — Progress Notes (Signed)
Priscilla King is a 37 y.o. G2P0010 at [redacted]w[redacted]d admitted for IOL d/t A2DM Called to room by RN d/t decels after foley bulb fell out  Subjective: Very uncomfortable w/ uc's, foley bulb just fell out. Requesting epidural  Objective: BP 116/71  Pulse 88  Temp(Src) 97.5 F (36.4 C) (Oral)  Resp 20  Ht 5\' 8"  (1.727 m)  Wt 104.781 kg (231 lb)  BMI 35.13 kg/m2  SpO2 100%  LMP 12/01/2011      FHT:  FHR: 135 bpm, variability: moderate,  accelerations:  Present,  decelerations:  Present lates UC:   regular, every 1-2 minutes SVE:   Dilation: 5 Effacement (%): 50 Station: -2 Exam by:: k Sondra Blixt,cnm Pitocin had been at 73mu/min- turned off by RN and LR bolus initiated  Labs: Lab Results  Component Value Date   WBC 7.3 08/30/2012   HGB 11.1* 08/30/2012   HCT 34.0* 08/30/2012   MCV 83.3 08/30/2012   PLT 226 08/30/2012    Assessment / Plan: IOL d/t A2DM, s/p cervical ripening phase- pitocin turned off at present time d/t lates after foley bulb fell out./tachysystole Reinitiate pitocin per protocol if needed after pt comfortable w/ epidural, fhr reassuring x at least 30 mins  Labor: cervical ripening phase complete Preeclampsia:  n/a Fetal Wellbeing:  Category II Pain Control:  Fentanyl, requesting epidural I/D:  n/a Anticipated MOD:  NSVD  Marge Duncans 08/31/2012, 12:32 AM

## 2012-09-01 ENCOUNTER — Encounter (HOSPITAL_COMMUNITY): Payer: Self-pay | Admitting: Obstetrics and Gynecology

## 2012-09-01 LAB — CBC
HCT: 31.9 % — ABNORMAL LOW (ref 36.0–46.0)
MCHC: 32.9 g/dL (ref 30.0–36.0)
MCV: 82.2 fL (ref 78.0–100.0)
Platelets: 202 10*3/uL (ref 150–400)
RDW: 13.9 % (ref 11.5–15.5)
WBC: 17.8 10*3/uL — ABNORMAL HIGH (ref 4.0–10.5)

## 2012-09-01 NOTE — Anesthesia Postprocedure Evaluation (Signed)
  Anesthesia Post-op Note  Patient: Priscilla King  Procedure(s) Performed: Procedure(s): CESAREAN SECTION (N/A)  Patient Location: Mother/Baby  Anesthesia Type:Epidural  Level of Consciousness: awake, alert  and oriented  Airway and Oxygen Therapy: Patient Spontanous Breathing  Post-op Pain: none  Post-op Assessment: Post-op Vital signs reviewed, Patient's Cardiovascular Status Stable, No headache, No backache, No residual numbness and No residual motor weakness  Post-op Vital Signs: Reviewed and stable  Complications: No apparent anesthesia complications

## 2012-09-01 NOTE — Progress Notes (Signed)
I have seen the patient with the resident/student and agree with the above.  Hogan, Heather Donovan  

## 2012-09-01 NOTE — Progress Notes (Signed)
Subjective: Postpartum Day 1: Cesarean Delivery Patient reports nausea and incisional pain, not up from bed yet. Foley catheter still inserted.   Objective: Vital signs in last 24 hours: Temp:  [97 F (36.1 C)-98.8 F (37.1 C)] 97.7 F (36.5 C) (02/10 0600) Pulse Rate:  [71-102] 71 (02/10 0600) Resp:  [17-24] 18 (02/10 0600) BP: (111-145)/(42-94) 111/73 mmHg (02/10 0600) SpO2:  [98 %-100 %] 99 % (02/10 0600)  Physical Exam:  General: alert, cooperative and no distress Lochia: appropriate Uterine Fundus: firm Incision: healing well, no significant drainage DVT Evaluation: No evidence of DVT seen on physical exam. Negative Homan's sign. No cords or calf tenderness. No significant calf/ankle edema.   Recent Labs  08/30/12 0815 09/01/12 0400  HGB 11.1* 10.5*  HCT 34.0* 31.9*    Assessment/Plan: Status post Cesarean section. Doing well postoperatively.  Continue current care. Breastfeeding and has not decided on contraceptive.   Adela Glimpse 09/01/2012, 7:44 AM

## 2012-09-02 MED ORDER — OXYCODONE-ACETAMINOPHEN 5-325 MG PO TABS
1.0000 | ORAL_TABLET | ORAL | Status: DC | PRN
  Administered 2012-09-02: 1 via ORAL
  Filled 2012-09-02: qty 1

## 2012-09-02 MED ORDER — DOCUSATE SODIUM 100 MG PO CAPS: 100.0000 mg | ORAL_CAPSULE | Freq: Two times a day (BID) | ORAL | Status: DC | PRN

## 2012-09-02 MED ORDER — IBUPROFEN 600 MG PO TABS: 600.0000 mg | ORAL_TABLET | Freq: Four times a day (QID) | ORAL | Status: DC | PRN

## 2012-09-02 MED ORDER — KETOROLAC TROMETHAMINE 15 MG/ML IJ SOLN
15.0000 mg | Freq: Four times a day (QID) | INTRAMUSCULAR | Status: DC | PRN
  Filled 2012-09-02: qty 1

## 2012-09-02 MED ORDER — OXYCODONE-ACETAMINOPHEN 5-325 MG PO TABS: 1.0000 | ORAL_TABLET | ORAL | Status: DC | PRN

## 2012-09-02 NOTE — Discharge Summary (Signed)
Priscilla King is a 37 y.o. G2P0010 female at [redacted]w[redacted]d by LMP who presented for IOL d/t A2DM on metformin 500mg  bid.  Pt did not progress with foley bulb and Pitocin and was taken for CS. A viable female infant was delivered with Apgars of 8/9, intact placenta with three vessel cord, EBL 800cc.  Pt desires to breast feed and unsure of contraception.  She will need a 2 hr glucola at follow up.    Obstetric Discharge Summary Reason for Admission: induction of labor Prenatal Procedures: none Intrapartum Procedures: cesarean: low cervical, transverse Postpartum Procedures: none Complications-Operative and Postpartum: none Hemoglobin  Date Value Range Status  09/01/2012 10.5* 12.0 - 15.0 g/dL Final  1/47/8295 62.1   Final     HCT  Date Value Range Status  09/01/2012 31.9* 36.0 - 46.0 % Final  02/11/2012 42   Final    Physical Exam:  General: alert, cooperative and appears stated age Lochia: appropriate Uterine Fundus: firm Incision: healing well, no significant drainage DVT Evaluation: No evidence of DVT seen on physical exam.  Discharge Diagnoses: Term Pregnancy-delivered  Discharge Information: Date: 09/02/2012 Activity: pelvic rest Diet: routine Medications: Ibuprofen Condition: stable Instructions: refer to practice specific booklet Discharge to: home   Newborn Data: Live born female  Birth Weight: 8 lb 12.4 oz (3980 g) APGAR: 8, 9  Home with mother.  Twana First Paulina Fusi, DO of [facility] 09/02/2012, 7:50 AM

## 2012-09-04 NOTE — Discharge Summary (Signed)
Agree with above note.  Mi Balla H. 09/04/2012 11:05 PM

## 2012-09-12 ENCOUNTER — Ambulatory Visit (INDEPENDENT_AMBULATORY_CARE_PROVIDER_SITE_OTHER): Payer: BC Managed Care – PPO | Admitting: Family Medicine

## 2012-09-12 DIAGNOSIS — Z09 Encounter for follow-up examination after completed treatment for conditions other than malignant neoplasm: Secondary | ICD-10-CM

## 2012-09-12 NOTE — Progress Notes (Signed)
Patient is here today for incision check.  She is doing great!  Incision is clean dry and healing very well!  She will follow up with [place] in four more weeks for her post partum check, sooner if needed.

## 2012-10-10 ENCOUNTER — Encounter: Payer: Self-pay | Admitting: Obstetrics & Gynecology

## 2012-10-10 ENCOUNTER — Ambulatory Visit (INDEPENDENT_AMBULATORY_CARE_PROVIDER_SITE_OTHER): Payer: BC Managed Care – PPO | Admitting: Obstetrics & Gynecology

## 2012-10-10 DIAGNOSIS — Z309 Encounter for contraceptive management, unspecified: Secondary | ICD-10-CM

## 2012-10-10 MED ORDER — NORETHINDRONE 0.35 MG PO TABS: 1.0000 | ORAL_TABLET | Freq: Every day | ORAL | Status: DC

## 2012-10-10 NOTE — Progress Notes (Signed)
  Subjective:     Priscilla King is a 37 y.o. G34P1011 female who presents for a postpartum visit. She is 6 weeks postpartum following a LTCS for fetal intolerance of labor at [redacted]w[redacted]d after IOL for A2GDM. I have fully reviewed the prenatal and intrapartum course.  Anesthesia: epidural. Postpartum course has been complicated by incisional pain. Baby's course has been uncomplicated. Baby is feeding by breast. Bleeding: none. Bowel function is normal. Bladder function is normal. Patient is not sexually active. Contraception method is oral progesterone-only contraceptive. Postpartum depression screening: negative.  The following portions of the patient's history were reviewed and updated as appropriate: allergies, current medications, past family history, past medical history, past social history, past surgical history and problem list. Last pap smear was July 2013.  Review of Systems Pertinent items are noted in HPI.   Objective:    BP 137/88  Pulse 85  Resp 16  Ht 5\' 8"  (1.727 m)  Wt 210 lb (95.255 kg)  BMI 31.94 kg/m2  Breastfeeding? Yes  General:  alert and no distress   Breasts:  inspection negative, no nipple discharge or bleeding, no masses or nodularity palpable. Engorged breasts.  Abdomen: soft, non-tender; bowel sounds normal; no masses,  no organomegaly   Vulva:  not evaluated  Vagina: not evaluated  Cervix:  not evaluated  Corpus: normal  Adnexa:  normal adnexa  Rectal Exam: Not performed.        Assessment:     Normal postpartum exam. Pap smear not done at today's visit.   Plan:   1. Contraception: oral progesterone-only contraceptive prescribed 2. History of A2GDM: 2 hr GTT to be done between 6-12 weeks postpartum, will schedule lab appointment today 3. Follow up in 01/2013 for annual exam

## 2012-10-10 NOTE — Patient Instructions (Addendum)
Return for 2 hr GTT

## 2012-11-13 ENCOUNTER — Telehealth: Payer: Self-pay

## 2013-01-07 ENCOUNTER — Telehealth: Payer: Self-pay | Admitting: Family Medicine

## 2013-01-07 NOTE — Telephone Encounter (Signed)
Spoke with Dr Patsy Lager and offered pt appt on 01/08/13 in the AM; pt said no she did not want to schedule appt for tomorrow and would go to UC. Pt ended call.

## 2013-01-07 NOTE — Telephone Encounter (Signed)
Patient Information:  Caller Name: Dezzie  Phone: [phone]  Patient: Priscilla King, Priscilla King  Gender: Female  DOB: September 06, 1975  Age: 37 Years  PCP: Hannah Beat [facility])  Pregnant: No  Office Follow Up:  Does the office need to follow up with this patient?: Yes  Instructions For The Office: PLEASE CALL PT FOR WORKIN IN APPT IF POSSIBLE. IF NOT PLEASE CALL AND LET HER KNOW TO GO ON TO UC.  RN Note:  No appt's available. Pt asking for a work in appt. Please call pt and let her know if she needs to go on to Urgent care.  Symptoms  Reason For Call & Symptoms: Pt calling regarding sciattica in rt leg. Severe pain. Seen in UC 2 weeks ago for same but pt states it is much worset today with numbness in her toes.  EMS came out to evaluate pt but told her to call her MD. Pt can "barely hobble".  Reviewed Health History In EMR: Yes  Reviewed Medications In EMR: Yes  Reviewed Allergies In EMR: Yes  Reviewed Surgeries / Procedures: Yes  Date of Onset of Symptoms: 01/07/2013  Treatments Tried: Ibuprofen-600 mg  Treatments Tried Worked: Yes OB / GYN:  LMP: 12/24/2012  Guideline(s) Used:  Leg Pain  Disposition Per Guideline:   Go to ED Now (or to Office with PCP Approval)  Reason For Disposition Reached:   Thigh or calf pain in only one leg and present > 1 hour  Advice Given:  N/A  Patient Will Follow Care Advice:  YES

## 2013-01-30 ENCOUNTER — Encounter: Payer: Self-pay | Admitting: Family Medicine

## 2013-01-30 ENCOUNTER — Ambulatory Visit (INDEPENDENT_AMBULATORY_CARE_PROVIDER_SITE_OTHER): Payer: BC Managed Care – PPO | Admitting: Family Medicine

## 2013-01-30 VITALS — BP 130/88 | HR 105 | Temp 97.4°F | Ht 68.0 in

## 2013-01-30 DIAGNOSIS — G5701 Lesion of sciatic nerve, right lower limb: Secondary | ICD-10-CM

## 2013-01-30 DIAGNOSIS — M5430 Sciatica, unspecified side: Secondary | ICD-10-CM

## 2013-01-30 DIAGNOSIS — G5700 Lesion of sciatic nerve, unspecified lower limb: Secondary | ICD-10-CM

## 2013-01-30 DIAGNOSIS — M5431 Sciatica, right side: Secondary | ICD-10-CM

## 2013-01-30 MED ORDER — PREDNISONE 20 MG PO TABS: ORAL_TABLET | ORAL | Status: DC

## 2013-01-30 MED ORDER — CYCLOBENZAPRINE HCL 10 MG PO TABS: 10.0000 mg | ORAL_TABLET | Freq: Every day | ORAL | Status: DC

## 2013-01-30 NOTE — Progress Notes (Signed)
Nature conservation officer at [facility] [address] [place] [address] Phone: [phone] Fax: [phone]  Date:  01/30/2013   Name:  Priscilla King   DOB:  1976-05-15   MRN:  [id number] Gender: female Age: 37 y.o.  Primary Physician:  Hannah Beat, MD  Evaluating MD: Hannah Beat, MD   Chief Complaint: Foot Pain   History of Present Illness:  Priscilla King is a 37 y.o. pleasant patient who presents with the following:  During pregnancy, had some sciatica --- all on the right. About two weeks ago, called 911. Given some flexeril at home. Now toes and foot are getting numb. Has had some severe pain and even had to go to the ER once. Pain in her R posterior buttocks. No history of chronic back pain, did have some mild a year or so ago. No bowel or bladder incont. No saddle anesthesia.   Took flexeril.   Numb - r lateral foot   Patient Active Problem List   Diagnosis Date Noted  . PCOS (polycystic ovarian syndrome) 04/15/2012  . Lower back pain 04/04/2012  . Headache 04/12/2011  . Bruxism (teeth grinding) 04/05/2011  . OTHER SPECIFIED FORMS OF HEARING LOSS 03/10/2009  . MEMORY LOSS 03/10/2009  . TIBIALIS TENDINITIS 06/04/2008  . PATELLO-FEMORAL SYNDROME 04/16/2008  . ACHILLES TENDINITIS, BILATERAL 04/16/2008    Past Medical History  Diagnosis Date  . ESR raised     elevated  . Achilles tendinosis     B, calcific tendonitis  . 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 History of polycystic ovaries   . Elevated blood sugar   . Gestational diabetes     metformin    Past Surgical History  Procedure Laterality Date  . No past surgeries    . Cesarean section N/A 08/31/2012    Procedure: CESAREAN SECTION;  Surgeon: Catalina Antigua, MD;  Location: [facility];  Service: Obstetrics;  Laterality: N/A;    History   Social History  . Marital Status: Married    Spouse Name: N/A    Number of Children: 0  . Years of Education: N/A   Occupational History   .     Social History Main Topics  . Smoking status: Never Smoker   . Smokeless tobacco: Never Used  . Alcohol Use: No  . Drug Use: No  . Sexually Active: Yes   Other Topics Concern  . Not on file   Social History Narrative   Married, Ritson   [place]   No exercise- knee          Family History  Problem Relation Age of Onset  . Stroke Mother     CVA (TIA)  . Diabetes Mother   . Diabetes Maternal Grandmother   . Hypertension Maternal Grandmother   . Diabetes Paternal Grandmother   . Hypertension Paternal Grandmother     Allergies  Allergen Reactions  . Vicodin (Hydrocodone-Acetaminophen) Swelling    Tongue swelling and headache    Medication list has been reviewed and updated.  Outpatient Prescriptions Prior to Visit  Medication Sig Dispense Refill  . ibuprofen (ADVIL,MOTRIN) 600 MG tablet Take 1 tablet (600 mg total) by mouth every 6 (six) hours as needed for pain.  30 tablet  1  . norethindrone (MICRONOR,CAMILA,ERRIN) 0.35 MG tablet Take 1 tablet (0.35 mg total) by mouth daily.  1 Package  11   No facility-administered medications prior to visit.    Review of Systems:   GEN: No  fevers, chills. Nontoxic. Primarily MSK c/o today. MSK: Detailed in the HPI GI: tolerating PO intake without difficulty Neuro: detailed above Otherwise the pertinent positives of the ROS are noted above.    Physical Examination: BP 130/88  Pulse 105  Temp(Src) 97.4 F (36.3 C) (Oral)  Ht 5\' 8"  (1.727 m)  SpO2 98%  Ideal Body Weight: Weight in (lb) to have BMI = 25: 164.1   GEN: Well-developed,well-nourished,in no acute distress; alert,appropriate and cooperative throughout examination HEENT: Normocephalic and atraumatic without obvious abnormalities. Ears, externally no deformities PULM: Breathing comfortably in no respiratory distress EXT: No clubbing, cyanosis, or edema PSYCH: Normally interactive. Cooperative during the interview. Pleasant. Friendly and conversant.  Not anxious or depressed appearing. Normal, full affect.  Range of motion at  the waist: Flexion: normal Extension: normal Lateral bending: normal Rotation: all normal  No echymosis or edema Rises to examination table with no difficulty Gait: non antalgic  Inspection/Deformity: N Paraspinus Tenderness: minimal tenderness  B Ankle Dorsiflexion (L5,4): 5/5 B Great Toe Dorsiflexion (L5,4): 5/5 Heel Walk (L5): WNL Toe Walk (S1): WNL Rise/Squat (L4): WNL  SENSORY B Medial Foot (L4): WNL B Dorsum (L5): WNL B Lateral (S1): decreased on the R Light Touch: above Pinprick: above  REFLEXES Knee (L4): 2+ Ankle (S1): 2+  B SLR, seated: neg B SLR, supine: neg B FABER: neg B Reverse FABER: pos ttp directly at piroformis on the R B Greater Troch: NT B Log Roll: neg B Stork: NT B Sciatic Notch: NT   Assessment and Plan:  Sciatica, right  Piriformis syndrome of right side  Hx most c/w piriformis, cannot exclude L5-s1 disk herniation.  Conservative hip rehab, piriformis rehab. Steroids, flexeril.  Recheck 4 weeks.  Orders Today:  No orders of the defined types were placed in this encounter.    Updated Medication List: (Includes new medications, updates to list, dose adjustments) Meds ordered this encounter  Medications  . predniSONE (DELTASONE) 20 MG tablet    Sig: 2 po for 5 days, then 1 po for 5 days    Dispense:  15 tablet    Refill:  0  . cyclobenzaprine (FLEXERIL) 10 MG tablet    Sig: Take 1 tablet (10 mg total) by mouth at bedtime.    Dispense:  30 tablet    Refill:  1    Medications Discontinued: Medications Discontinued During This Encounter  Medication Reason  . ibuprofen (ADVIL,MOTRIN) 600 MG tablet Error  . norethindrone (MICRONOR,CAMILA,ERRIN) 0.35 MG tablet Error      Signed, Correna Meacham T. Emon Miggins, MD 01/30/2013 4:13 PM

## 2013-01-30 NOTE — Patient Instructions (Addendum)

## 2013-02-01 ENCOUNTER — Encounter: Payer: Self-pay | Admitting: Family Medicine

## 2013-03-04 ENCOUNTER — Other Ambulatory Visit: Payer: BC Managed Care – PPO

## 2013-03-09 ENCOUNTER — Ambulatory Visit: Payer: BC Managed Care – PPO | Admitting: Family Medicine

## 2013-03-12 ENCOUNTER — Ambulatory Visit: Payer: BC Managed Care – PPO | Admitting: Family Medicine

## 2013-03-18 ENCOUNTER — Ambulatory Visit (INDEPENDENT_AMBULATORY_CARE_PROVIDER_SITE_OTHER): Payer: BC Managed Care – PPO | Admitting: Family Medicine

## 2013-03-18 ENCOUNTER — Encounter: Payer: Self-pay | Admitting: Family Medicine

## 2013-03-18 VITALS — BP 118/70 | HR 97 | Temp 98.2°F | Ht 68.0 in | Wt 204.2 lb

## 2013-03-18 DIAGNOSIS — M5431 Sciatica, right side: Secondary | ICD-10-CM

## 2013-03-18 DIAGNOSIS — G5701 Lesion of sciatic nerve, right lower limb: Secondary | ICD-10-CM

## 2013-03-18 DIAGNOSIS — G5700 Lesion of sciatic nerve, unspecified lower limb: Secondary | ICD-10-CM

## 2013-03-18 DIAGNOSIS — M5430 Sciatica, unspecified side: Secondary | ICD-10-CM

## 2013-03-18 NOTE — Progress Notes (Signed)
Nature conservation officer at [facility] [address] [place] [address] Phone: [phone] Fax: [phone]  Date:  03/18/2013   Name:  Priscilla King   DOB:  1976-04-07   MRN:  [id number] Gender: female Age: 37 y.o.  Primary Physician:  Hannah Beat, MD  Evaluating MD: Hannah Beat, MD   Chief Complaint: Follow-up   History of Present Illness:  Priscilla King is a 37 y.o. pleasant patient who presents with the following:  F/u piriformis: Sciatica, improving, but not fully better. Will intermittently flare, driving will be better. No excruciating pain. Approx 70% better. Some days doing well, others particularly with driving it is worse.  Numbness is now gone.  01/30/2013 During pregnancy, had some sciatica --- all on the right. About two weeks ago, called 911. Given some flexeril at home. Now toes and foot are getting numb. Has had some severe pain and even had to go to the ER once. Pain in her R posterior buttocks. No history of chronic back pain, did have some mild a year or so ago. No bowel or bladder incont. No saddle anesthesia.   Took flexeril.   Numb - r lateral foot   Patient Active Problem List   Diagnosis Date Noted  . PCOS (polycystic ovarian syndrome) 04/15/2012  . Bruxism (teeth grinding) 04/05/2011  . OTHER SPECIFIED FORMS OF HEARING LOSS 03/10/2009  . PATELLO-FEMORAL SYNDROME 04/16/2008    Past Medical History  Diagnosis Date  . ESR raised     elevated  . Achilles tendinosis     B, calcific tendonitis  . 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 History of polycystic ovaries   . Gestational diabetes     metformin    Past Surgical History  Procedure Laterality Date  . No past surgeries    . Cesarean section N/A 08/31/2012    Procedure: CESAREAN SECTION;  Surgeon: Catalina Antigua, MD;  Location: [facility];  Service: Obstetrics;  Laterality: N/A;    History   Social History  . Marital Status: Married    Spouse Name: N/A    Number  of Children: 0  . Years of Education: N/A   Occupational History  .     Social History Main Topics  . Smoking status: Never Smoker   . Smokeless tobacco: Never Used  . Alcohol Use: No  . Drug Use: No  . Sexual Activity: Yes   Other Topics Concern  . Not on file   Social History Narrative   Married, Ritson   [place]   No exercise- knee          Family History  Problem Relation Age of Onset  . Stroke Mother     CVA (TIA)  . Diabetes Mother   . Diabetes Maternal Grandmother   . Hypertension Maternal Grandmother   . Diabetes Paternal Grandmother   . Hypertension Paternal Grandmother     Allergies  Allergen Reactions  . Vicodin [Hydrocodone-Acetaminophen] Swelling    Tongue swelling and headache    Medication list has been reviewed and updated.  Outpatient Prescriptions Prior to Visit  Medication Sig Dispense Refill  . cyclobenzaprine (FLEXERIL) 10 MG tablet Take 1 tablet (10 mg total) by mouth at bedtime.  30 tablet  1  . predniSONE (DELTASONE) 20 MG tablet 2 po for 5 days, then 1 po for 5 days  15 tablet  0   No facility-administered medications prior to visit.    Review of Systems:  GEN: No fevers, chills. Nontoxic. Primarily MSK c/o today. MSK: Detailed in the HPI GI: tolerating PO intake without difficulty Neuro: detailed above Otherwise the pertinent positives of the ROS are noted above.    Physical Examination: BP 118/70  Pulse 97  Temp(Src) 98.2 F (36.8 C) (Oral)  Ht 5\' 8"  (1.727 m)  Wt 204 lb 4 oz (92.647 kg)  BMI 31.06 kg/m2  LMP 03/02/2013  Breastfeeding? No  Ideal Body Weight: Weight in (lb) to have BMI = 25: 164.1   GEN: Well-developed,well-nourished,in no acute distress; alert,appropriate and cooperative throughout examination HEENT: Normocephalic and atraumatic without obvious abnormalities. Ears, externally no deformities PULM: Breathing comfortably in no respiratory distress EXT: No clubbing, cyanosis, or edema PSYCH:  Normally interactive. Cooperative during the interview. Pleasant. Friendly and conversant. Not anxious or depressed appearing. Normal, full affect.  Range of motion at  the waist: Flexion, extension, lateral bending and rotation:   No echymosis or edema Rises to examination table with mild difficulty Gait: minimally antalgic  Inspection/Deformity: N Paraspinus Tenderness: none  B Ankle Dorsiflexion (L5,4): 5/5 B Great Toe Dorsiflexion (L5,4): 5/5 Heel Walk (L5): WNL Toe Walk (S1): WNL Rise/Squat (L4): WNL, mild pain  SENSORY B Medial Foot (L4): WNL B Dorsum (L5): WNL B Lateral (S1): WNL Light Touch: WNL Pinprick: WNL  REFLEXES Knee (L4): 2+ Ankle (S1): 2+  B SLR, seated: neg B SLR, supine: neg B FABER: neg B Reverse FABER: POS, TTP at piriformis on R B Greater Troch: NT B Log Roll: neg B Stork: NT B Sciatic Notch: NT   Assessment and Plan:  Sciatica, right  Piriformis syndrome of right side  Cont with conservative care. Discussed more aggressive approach, but she is doing well with rehab and yoga - keep it up and f/u 2 mo  Orders Today:  No orders of the defined types were placed in this encounter.    Updated Medication List: (Includes new medications, updates to list, dose adjustments) No orders of the defined types were placed in this encounter.    Medications Discontinued: There are no discontinued medications.    Signed, Elpidio Galea. Cyan Moultrie, MD 03/18/2013 10:07 AM

## 2013-03-25 ENCOUNTER — Ambulatory Visit: Payer: BC Managed Care – PPO | Admitting: Obstetrics & Gynecology

## 2013-04-01 ENCOUNTER — Ambulatory Visit (INDEPENDENT_AMBULATORY_CARE_PROVIDER_SITE_OTHER): Payer: BC Managed Care – PPO | Admitting: Obstetrics and Gynecology

## 2013-04-01 ENCOUNTER — Encounter: Payer: Self-pay | Admitting: Obstetrics and Gynecology

## 2013-04-01 VITALS — BP 131/91 | HR 99 | Ht 68.0 in | Wt 204.0 lb

## 2013-04-01 DIAGNOSIS — Z124 Encounter for screening for malignant neoplasm of cervix: Secondary | ICD-10-CM

## 2013-04-01 DIAGNOSIS — Z01419 Encounter for gynecological examination (general) (routine) without abnormal findings: Secondary | ICD-10-CM

## 2013-04-01 DIAGNOSIS — Z1151 Encounter for screening for human papillomavirus (HPV): Secondary | ICD-10-CM

## 2013-04-01 MED ORDER — LEVONORGEST-ETH ESTRAD 91-DAY 0.15-0.03 MG PO TABS: 1.0000 | ORAL_TABLET | Freq: Every day | ORAL | Status: DC

## 2013-04-01 NOTE — Progress Notes (Signed)
  Subjective:     Priscilla King is a 37 y.o. female G2P1011 with LMP 03/02/2013 who is here for a comprehensive physical exam. The patient reports no problems. She stopped breastfeeding and desires to use extended form of OCP. Patient not fasting today but is aware of her need for 2hr glucose test.  History   Social History  . Marital Status: Married    Spouse Name: N/A    Number of Children: 0  . Years of Education: N/A   Occupational History  .     Social History Main Topics  . Smoking status: Never Smoker   . Smokeless tobacco: Never Used  . Alcohol Use: No  . Drug Use: No  . Sexual Activity: Yes   Other Topics Concern  . Not on file   Social History Narrative   Married, Ritson   [place]   No exercise- knee         Health Maintenance  Topic Date Due  . Influenza Vaccine  02/20/2013  . Pap Smear  04/22/2015  . Tetanus/tdap  06/05/2022   Past Medical History  Diagnosis Date  . ESR raised     elevated  . Achilles tendinosis     B, calcific tendonitis  . 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 History of polycystic ovaries   . Gestational diabetes     metformin   Past Surgical History  Procedure Laterality Date  . No past surgeries    . Cesarean section N/A 08/31/2012    Procedure: CESAREAN SECTION;  Surgeon: Catalina Antigua, MD;  Location: [facility];  Service: Obstetrics;  Laterality: N/A;   Family History  Problem Relation Age of Onset  . Stroke Mother     CVA (TIA)  . Diabetes Mother   . Diabetes Maternal Grandmother   . Hypertension Maternal Grandmother   . Diabetes Paternal Grandmother   . Hypertension Paternal Grandmother    History  Substance Use Topics  . Smoking status: Never Smoker   . Smokeless tobacco: Never Used  . Alcohol Use: No       Review of Systems A comprehensive review of systems was negative.   Objective:      GENERAL: Well-developed, well-nourished female in no acute distress.  HEENT: Normocephalic, atraumatic.  Sclerae anicteric.  NECK: Supple. Normal thyroid.  LUNGS: Clear to auscultation bilaterally.  HEART: Regular rate and rhythm. BREASTS: Symmetric in size. No palpable masses or lymphadenopathy, skin changes, or nipple drainage. ABDOMEN: Soft, nontender, nondistended. No organomegaly. PELVIC: Normal external female genitalia. Vagina is pink and rugated.  Normal discharge. Normal appearing cervix. Uterus is normal in size. No adnexal mass or tenderness. EXTREMITIES: No cyanosis, clubbing, or edema, 2+ distal pulses.    Assessment:    Healthy female exam.      Plan:    pap smear collected Patient advised to continue monthly self breast and vulva exam Patient to return for fasting labs See After Visit Summary for Counseling Recommendations

## 2013-04-01 NOTE — Patient Instructions (Signed)
Preventive Care for Adults, Female A healthy lifestyle and preventive care can promote health and wellness. Preventive health guidelines for women include the following key practices.  A routine yearly physical is a good way to check with your caregiver about your health and preventive screening. It is a chance to share any concerns and updates on your health, and to receive a thorough exam.  Visit your dentist for a routine exam and preventive care every 6 months. Brush your teeth twice a day and floss once a day. Good oral hygiene prevents tooth decay and gum disease.  The frequency of eye exams is based on your age, health, family medical history, use of contact lenses, and other factors. Follow your caregiver's recommendations for frequency of eye exams.  Eat a healthy diet. Foods like vegetables, fruits, whole grains, low-fat dairy products, and lean protein foods contain the nutrients you need without too many calories. Decrease your intake of foods high in solid fats, added sugars, and salt. Eat the right amount of calories for you.Get information about a proper diet from your caregiver, if necessary.  Regular physical exercise is one of the most important things you can do for your health. Most adults should get at least 150 minutes of moderate-intensity exercise (any activity that increases your heart rate and causes you to sweat) each week. In addition, most adults need muscle-strengthening exercises on 2 or more days a week.  Maintain a healthy weight. The body mass index (BMI) is a screening tool to identify possible weight problems. It provides an estimate of body fat based on height and weight. Your caregiver can help determine your BMI, and can help you achieve or maintain a healthy weight.For adults 20 years and older:  A BMI below 18.5 is considered underweight.  A BMI of 18.5 to 24.9 is normal.  A BMI of 25 to 29.9 is considered overweight.  A BMI of 30 and above is  considered obese.  Maintain normal blood lipids and cholesterol levels by exercising and minimizing your intake of saturated fat. Eat a balanced diet with plenty of fruit and vegetables. Blood tests for lipids and cholesterol should begin at age 20 and be repeated every 5 years. If your lipid or cholesterol levels are high, you are over 50, or you are at high risk for heart disease, you may need your cholesterol levels checked more frequently.Ongoing high lipid and cholesterol levels should be treated with medicines if diet and exercise are not effective.  If you smoke, find out from your caregiver how to quit. If you do not use tobacco, do not start.  If you are pregnant, do not drink alcohol. If you are breastfeeding, be very cautious about drinking alcohol. If you are not pregnant and choose to drink alcohol, do not exceed 1 drink per day. One drink is considered to be 12 ounces (355 mL) of beer, 5 ounces (148 mL) of wine, or 1.5 ounces (44 mL) of liquor.  Avoid use of street drugs. Do not share needles with anyone. Ask for help if you need support or instructions about stopping the use of drugs.  High blood pressure causes heart disease and increases the risk of stroke. Your blood pressure should be checked at least every 1 to 2 years. Ongoing high blood pressure should be treated with medicines if weight loss and exercise are not effective.  If you are 55 to 37 years old, ask your caregiver if you should take aspirin to prevent strokes.  Diabetes   screening involves taking a blood sample to check your fasting blood sugar level. This should be done once every 3 years, after age 45, if you are within normal weight and without risk factors for diabetes. Testing should be considered at a younger age or be carried out more frequently if you are overweight and have at least 1 risk factor for diabetes.  Breast cancer screening is essential preventive care for women. You should practice "breast  self-awareness." This means understanding the normal appearance and feel of your breasts and may include breast self-examination. Any changes detected, no matter how small, should be reported to a caregiver. Women in their 20s and 30s should have a clinical breast exam (CBE) by a caregiver as part of a regular health exam every 1 to 3 years. After age 40, women should have a CBE every year. Starting at age 40, women should consider having a mammography (breast X-ray test) every year. Women who have a family history of breast cancer should talk to their caregiver about genetic screening. Women at a high risk of breast cancer should talk to their caregivers about having magnetic resonance imaging (MRI) and a mammography every year.  The Pap test is a screening test for cervical cancer. A Pap test can show cell changes on the cervix that might become cervical cancer if left untreated. A Pap test is a procedure in which cells are obtained and examined from the lower end of the uterus (cervix).  Women should have a Pap test starting at age 21.  Between ages 21 and 29, Pap tests should be repeated every 2 years.  Beginning at age 30, you should have a Pap test every 3 years as long as the past 3 Pap tests have been normal.  Some women have medical problems that increase the chance of getting cervical cancer. Talk to your caregiver about these problems. It is especially important to talk to your caregiver if a new problem develops soon after your last Pap test. In these cases, your caregiver may recommend more frequent screening and Pap tests.  The above recommendations are the same for women who have or have not gotten the vaccine for human papillomavirus (HPV).  If you had a hysterectomy for a problem that was not cancer or a condition that could lead to cancer, then you no longer need Pap tests. Even if you no longer need a Pap test, a regular exam is a good idea to make sure no other problems are  starting.  If you are between ages 65 and 70, and you have had normal Pap tests going back 10 years, you no longer need Pap tests. Even if you no longer need a Pap test, a regular exam is a good idea to make sure no other problems are starting.  If you have had past treatment for cervical cancer or a condition that could lead to cancer, you need Pap tests and screening for cancer for at least 20 years after your treatment.  If Pap tests have been discontinued, risk factors (such as a new sexual partner) need to be reassessed to determine if screening should be resumed.  The HPV test is an additional test that may be used for cervical cancer screening. The HPV test looks for the virus that can cause the cell changes on the cervix. The cells collected during the Pap test can be tested for HPV. The HPV test could be used to screen women aged 30 years and older, and should   be used in women of any age who have unclear Pap test results. After the age of 30, women should have HPV testing at the same frequency as a Pap test.  Colorectal cancer can be detected and often prevented. Most routine colorectal cancer screening begins at the age of 50 and continues through age 75. However, your caregiver may recommend screening at an earlier age if you have risk factors for colon cancer. On a yearly basis, your caregiver may provide home test kits to check for hidden blood in the stool. Use of a small camera at the end of a tube, to directly examine the colon (sigmoidoscopy or colonoscopy), can detect the earliest forms of colorectal cancer. Talk to your caregiver about this at age 50, when routine screening begins. Direct examination of the colon should be repeated every 5 to 10 years through age 75, unless early forms of pre-cancerous polyps or small growths are found.  Hepatitis C blood testing is recommended for all people born from 1945 through 1965 and any individual with known risks for hepatitis C.  Practice  safe sex. Use condoms and avoid high-risk sexual practices to reduce the spread of sexually transmitted infections (STIs). STIs include gonorrhea, chlamydia, syphilis, trichomonas, herpes, HPV, and human immunodeficiency virus (HIV). Herpes, HIV, and HPV are viral illnesses that have no cure. They can result in disability, cancer, and death. Sexually active women aged 25 and younger should be checked for chlamydia. Older women with new or multiple partners should also be tested for chlamydia. Testing for other STIs is recommended if you are sexually active and at increased risk.  Osteoporosis is a disease in which the bones lose minerals and strength with aging. This can result in serious bone fractures. The risk of osteoporosis can be identified using a bone density scan. Women ages 65 and over and women at risk for fractures or osteoporosis should discuss screening with their caregivers. Ask your caregiver whether you should take a calcium supplement or vitamin D to reduce the rate of osteoporosis.  Menopause can be associated with physical symptoms and risks. Hormone replacement therapy is available to decrease symptoms and risks. You should talk to your caregiver about whether hormone replacement therapy is right for you.  Use sunscreen with sun protection factor (SPF) of 30 or more. Apply sunscreen liberally and repeatedly throughout the day. You should seek shade when your shadow is shorter than you. Protect yourself by wearing long sleeves, pants, a wide-brimmed hat, and sunglasses year round, whenever you are outdoors.  Once a month, do a whole body skin exam, using a mirror to look at the skin on your back. Notify your caregiver of new moles, moles that have irregular borders, moles that are larger than a pencil eraser, or moles that have changed in shape or color.  Stay current with required immunizations.  Influenza. You need a dose every fall (or winter). The composition of the flu vaccine  changes each year, so being vaccinated once is not enough.  Pneumococcal polysaccharide. You need 1 to 2 doses if you smoke cigarettes or if you have certain chronic medical conditions. You need 1 dose at age 65 (or older) if you have never been vaccinated.  Tetanus, diphtheria, pertussis (Tdap, Td). Get 1 dose of Tdap vaccine if you are younger than age 65, are over 65 and have contact with an infant, are a healthcare worker, are pregnant, or simply want to be protected from whooping cough. After that, you need a Td   booster dose every 10 years. Consult your caregiver if you have not had at least 3 tetanus and diphtheria-containing shots sometime in your life or have a deep or dirty wound.  HPV. You need this vaccine if you are a woman age 26 or younger. The vaccine is given in 3 doses over 6 months.  Measles, mumps, rubella (MMR). You need at least 1 dose of MMR if you were born in 1957 or later. You may also need a second dose.  Meningococcal. If you are age 19 to 21 and a first-year college student living in a residence hall, or have one of several medical conditions, you need to get vaccinated against meningococcal disease. You may also need additional booster doses.  Zoster (shingles). If you are age 60 or older, you should get this vaccine.  Varicella (chickenpox). If you have never had chickenpox or you were vaccinated but received only 1 dose, talk to your caregiver to find out if you need this vaccine.  Hepatitis A. You need this vaccine if you have a specific risk factor for hepatitis A virus infection or you simply wish to be protected from this disease. The vaccine is usually given as 2 doses, 6 to 18 months apart.  Hepatitis B. You need this vaccine if you have a specific risk factor for hepatitis B virus infection or you simply wish to be protected from this disease. The vaccine is given in 3 doses, usually over 6 months. Preventive Services / Frequency Ages 19 to 39  Blood  pressure check.** / Every 1 to 2 years.  Lipid and cholesterol check.** / Every 5 years beginning at age 20.  Clinical breast exam.** / Every 3 years for women in their 20s and 30s.  Pap test.** / Every 2 years from ages 21 through 29. Every 3 years starting at age 30 through age 65 or 70 with a history of 3 consecutive normal Pap tests.  HPV screening.** / Every 3 years from ages 30 through ages 65 to 70 with a history of 3 consecutive normal Pap tests.  Hepatitis C blood test.** / For any individual with known risks for hepatitis C.  Skin self-exam. / Monthly.  Influenza immunization.** / Every year.  Pneumococcal polysaccharide immunization.** / 1 to 2 doses if you smoke cigarettes or if you have certain chronic medical conditions.  Tetanus, diphtheria, pertussis (Tdap, Td) immunization. / A one-time dose of Tdap vaccine. After that, you need a Td booster dose every 10 years.  HPV immunization. / 3 doses over 6 months, if you are 26 and younger.  Measles, mumps, rubella (MMR) immunization. / You need at least 1 dose of MMR if you were born in 1957 or later. You may also need a second dose.  Meningococcal immunization. / 1 dose if you are age 19 to 21 and a first-year college student living in a residence hall, or have one of several medical conditions, you need to get vaccinated against meningococcal disease. You may also need additional booster doses.  Varicella immunization.** / Consult your caregiver.  Hepatitis A immunization.** / Consult your caregiver. 2 doses, 6 to 18 months apart.  Hepatitis B immunization.** / Consult your caregiver. 3 doses usually over 6 months. Ages 40 to 64  Blood pressure check.** / Every 1 to 2 years.  Lipid and cholesterol check.** / Every 5 years beginning at age 20.  Clinical breast exam.** / Every year after age 40.  Mammogram.** / Every year beginning at age 40   and continuing for as long as you are in good health. Consult with your  caregiver.  Pap test.** / Every 3 years starting at age 30 through age 65 or 70 with a history of 3 consecutive normal Pap tests.  HPV screening.** / Every 3 years from ages 30 through ages 65 to 70 with a history of 3 consecutive normal Pap tests.  Fecal occult blood test (FOBT) of stool. / Every year beginning at age 50 and continuing until age 75. You may not need to do this test if you get a colonoscopy every 10 years.  Flexible sigmoidoscopy or colonoscopy.** / Every 5 years for a flexible sigmoidoscopy or every 10 years for a colonoscopy beginning at age 50 and continuing until age 75.  Hepatitis C blood test.** / For all people born from 1945 through 1965 and any individual with known risks for hepatitis C.  Skin self-exam. / Monthly.  Influenza immunization.** / Every year.  Pneumococcal polysaccharide immunization.** / 1 to 2 doses if you smoke cigarettes or if you have certain chronic medical conditions.  Tetanus, diphtheria, pertussis (Tdap, Td) immunization.** / A one-time dose of Tdap vaccine. After that, you need a Td booster dose every 10 years.  Measles, mumps, rubella (MMR) immunization. / You need at least 1 dose of MMR if you were born in 1957 or later. You may also need a second dose.  Varicella immunization.** / Consult your caregiver.  Meningococcal immunization.** / Consult your caregiver.  Hepatitis A immunization.** / Consult your caregiver. 2 doses, 6 to 18 months apart.  Hepatitis B immunization.** / Consult your caregiver. 3 doses, usually over 6 months. Ages 65 and over  Blood pressure check.** / Every 1 to 2 years.  Lipid and cholesterol check.** / Every 5 years beginning at age 20.  Clinical breast exam.** / Every year after age 40.  Mammogram.** / Every year beginning at age 40 and continuing for as long as you are in good health. Consult with your caregiver.  Pap test.** / Every 3 years starting at age 30 through age 65 or 70 with a 3  consecutive normal Pap tests. Testing can be stopped between 65 and 70 with 3 consecutive normal Pap tests and no abnormal Pap or HPV tests in the past 10 years.  HPV screening.** / Every 3 years from ages 30 through ages 65 or 70 with a history of 3 consecutive normal Pap tests. Testing can be stopped between 65 and 70 with 3 consecutive normal Pap tests and no abnormal Pap or HPV tests in the past 10 years.  Fecal occult blood test (FOBT) of stool. / Every year beginning at age 50 and continuing until age 75. You may not need to do this test if you get a colonoscopy every 10 years.  Flexible sigmoidoscopy or colonoscopy.** / Every 5 years for a flexible sigmoidoscopy or every 10 years for a colonoscopy beginning at age 50 and continuing until age 75.  Hepatitis C blood test.** / For all people born from 1945 through 1965 and any individual with known risks for hepatitis C.  Osteoporosis screening.** / A one-time screening for women ages 65 and over and women at risk for fractures or osteoporosis.  Skin self-exam. / Monthly.  Influenza immunization.** / Every year.  Pneumococcal polysaccharide immunization.** / 1 dose at age 65 (or older) if you have never been vaccinated.  Tetanus, diphtheria, pertussis (Tdap, Td) immunization. / A one-time dose of Tdap vaccine if you are over   65 and have contact with an infant, are a healthcare worker, or simply want to be protected from whooping cough. After that, you need a Td booster dose every 10 years.  Varicella immunization.** / Consult your caregiver.  Meningococcal immunization.** / Consult your caregiver.  Hepatitis A immunization.** / Consult your caregiver. 2 doses, 6 to 18 months apart.  Hepatitis B immunization.** / Check with your caregiver. 3 doses, usually over 6 months. ** Family history and personal history of risk and conditions may change your caregiver's recommendations. Document Released: 09/04/2001 Document Revised: 10/01/2011  Document Reviewed: 12/04/2010 [employer] Patient Information 2014 [employer].  

## 2013-05-28 ENCOUNTER — Other Ambulatory Visit: Payer: Self-pay

## 2013-09-10 ENCOUNTER — Ambulatory Visit: Payer: BC Managed Care – PPO | Admitting: Obstetrics and Gynecology

## 2013-09-17 ENCOUNTER — Ambulatory Visit: Payer: BC Managed Care – PPO | Admitting: Family Medicine

## 2013-09-22 ENCOUNTER — Ambulatory Visit: Payer: BC Managed Care – PPO | Admitting: Obstetrics & Gynecology

## 2013-09-22 ENCOUNTER — Encounter: Payer: Self-pay | Admitting: Obstetrics & Gynecology

## 2013-09-22 ENCOUNTER — Ambulatory Visit (INDEPENDENT_AMBULATORY_CARE_PROVIDER_SITE_OTHER): Payer: BC Managed Care – PPO | Admitting: Obstetrics & Gynecology

## 2013-09-22 VITALS — BP 143/93 | HR 77 | Ht 60.0 in | Wt 217.0 lb

## 2013-09-22 DIAGNOSIS — Z309 Encounter for contraceptive management, unspecified: Secondary | ICD-10-CM

## 2013-09-22 DIAGNOSIS — IMO0001 Reserved for inherently not codable concepts without codable children: Secondary | ICD-10-CM

## 2013-09-22 MED ORDER — LEVONORGEST-ETH ESTRAD 91-DAY 0.15-0.03 MG PO TABS: 1.0000 | ORAL_TABLET | Freq: Every day | ORAL | Status: DC

## 2013-09-22 NOTE — Progress Notes (Signed)
   Subjective:    Patient ID: Priscilla King, female    DOB: [birth year], 38 y.o.   MRN: [id number]  HPI  38 yo M AA P58 (28 month old son) here to discuss her OCPs. She is currently on POP but quit breastfeeding 4 months ago. She would like to return to OCPs. She liked quasense/seasonale.  Review of Systems     Objective:   Physical Exam        Assessment & Plan:  Contraception- as above Prescribed Seasonale

## 2013-10-27 ENCOUNTER — Ambulatory Visit (INDEPENDENT_AMBULATORY_CARE_PROVIDER_SITE_OTHER): Payer: BC Managed Care – PPO | Admitting: Family Medicine

## 2013-10-27 ENCOUNTER — Encounter: Payer: Self-pay | Admitting: Family Medicine

## 2013-10-27 VITALS — BP 131/94 | HR 90 | Ht 68.0 in | Wt 221.8 lb

## 2013-10-27 DIAGNOSIS — IMO0001 Reserved for inherently not codable concepts without codable children: Secondary | ICD-10-CM | POA: Insufficient documentation

## 2013-10-27 DIAGNOSIS — R030 Elevated blood-pressure reading, without diagnosis of hypertension: Secondary | ICD-10-CM

## 2013-10-27 NOTE — Progress Notes (Signed)
Follow up new birth control and BP recheck due to increased blood pressure at last visit.

## 2013-10-27 NOTE — Assessment & Plan Note (Signed)
Watch for consistent Blood pressure > 140/90.  If BP stays in this range, consistently, please come in for further discussion of birth control options.

## 2013-10-27 NOTE — Progress Notes (Signed)
    Subjective:    Patient ID: Priscilla King is a 38 y.o. female presenting with Follow-up  on 10/27/2013  HPI: Switched to combination OC's with mildly increased BP--here for re-check.  Is checking BP at home.  Working on lifestyle modifications, including diet and exercise and stress reduction, avoidance of salt.  Review of Systems  Constitutional: Negative for fever and chills.  Respiratory: Negative for shortness of breath.   Cardiovascular: Negative for chest pain.  Gastrointestinal: Negative for nausea, vomiting and abdominal pain.  Genitourinary: Negative for dysuria.  Skin: Negative for rash.      Objective:    BP 131/94  Pulse 90  Ht 5\' 8"  (1.727 m)  Wt 221 lb 12.8 oz (100.608 kg)  BMI 33.73 kg/m2  LMP 10/23/2013 Physical Exam  Constitutional: She is oriented to person, place, and time. She appears well-developed and well-nourished. No distress.  HENT:  Head: Normocephalic and atraumatic.  Eyes: No scleral icterus.  Neck: Neck supple.  Cardiovascular: Normal rate.   Pulmonary/Chest: Effort normal.  Abdominal: Soft.  Neurological: She is alert and oriented to person, place, and time.  Skin: Skin is warm and dry.  Psychiatric: She has a normal mood and affect.        Assessment & Plan:   Elevated blood pressure Watch for consistent Blood pressure > 140/90.  If BP stays in this range, consistently, please come in for further discussion of birth control options.   See AVS Return in about 3 months (around 01/26/2014) for a follow-up.

## 2013-10-27 NOTE — Patient Instructions (Signed)
Contraception Choices Contraception (birth control) is the use of any methods or devices to prevent pregnancy. Below are some methods to help avoid pregnancy. HORMONAL METHODS   Contraceptive implant This is a thin, plastic tube containing progesterone hormone. It does not contain estrogen hormone. Your health care provider inserts the tube in the inner part of the upper arm. The tube can remain in place for up to 3 years. After 3 years, the implant must be removed. The implant prevents the ovaries from releasing an egg (ovulation), thickens the cervical mucus to prevent sperm from entering the uterus, and thins the lining of the inside of the uterus.  Progesterone-only injections These injections are given every 3 months by your health care provider to prevent pregnancy. This synthetic progesterone hormone stops the ovaries from releasing eggs. It also thickens cervical mucus and changes the uterine lining. This makes it harder for sperm to survive in the uterus.  Birth control pills These pills contain estrogen and progesterone hormone. They work by preventing the ovaries from releasing eggs (ovulation). They also cause the cervical mucus to thicken, preventing the sperm from entering the uterus. Birth control pills are prescribed by a health care provider.Birth control pills can also be used to treat heavy periods.  Minipill This type of birth control pill contains only the progesterone hormone. They are taken every day of each month and must be prescribed by your health care provider.  Birth control patch The patch contains hormones similar to those in birth control pills. It must be changed once a week and is prescribed by a health care provider.  Vaginal ring The ring contains hormones similar to those in birth control pills. It is left in the vagina for 3 weeks, removed for 1 week, and then a new one is put back in place. The patient must be comfortable inserting and removing the ring from the  vagina.A health care provider's prescription is necessary.  Emergency contraception Emergency contraceptives prevent pregnancy after unprotected sexual intercourse. This pill can be taken right after sex or up to 5 days after unprotected sex. It is most effective the sooner you take the pills after having sexual intercourse. Most emergency contraceptive pills are available without a prescription. Check with your pharmacist. Do not use emergency contraception as your only form of birth control. BARRIER METHODS   Female condom This is a thin sheath (latex or rubber) that is worn over the penis during sexual intercourse. It can be used with spermicide to increase effectiveness.  Female condom. This is a soft, loose-fitting sheath that is put into the vagina before sexual intercourse.  Diaphragm This is a soft, latex, dome-shaped barrier that must be fitted by a health care provider. It is inserted into the vagina, along with a spermicidal jelly. It is inserted before intercourse. The diaphragm should be left in the vagina for 6 to 8 hours after intercourse.  Cervical cap This is a round, soft, latex or plastic cup that fits over the cervix and must be fitted by a health care provider. The cap can be left in place for up to 48 hours after intercourse.  Sponge This is a soft, circular piece of polyurethane foam. The sponge has spermicide in it. It is inserted into the vagina after wetting it and before sexual intercourse.  Spermicides These are chemicals that kill or block sperm from entering the cervix and uterus. They come in the form of creams, jellies, suppositories, foam, or tablets. They do not require a   prescription. They are inserted into the vagina with an applicator before having sexual intercourse. The process must be repeated every time you have sexual intercourse. INTRAUTERINE CONTRACEPTION  Intrauterine device (IUD) This is a T-shaped device that is put in a woman's uterus during a  menstrual period to prevent pregnancy. There are 2 types:  Copper IUD This type of IUD is wrapped in copper wire and is placed inside the uterus. Copper makes the uterus and fallopian tubes produce a fluid that kills sperm. It can stay in place for 10 years.  Hormone IUD This type of IUD contains the hormone progestin (synthetic progesterone). The hormone thickens the cervical mucus and prevents sperm from entering the uterus, and it also thins the uterine lining to prevent implantation of a fertilized egg. The hormone can weaken or kill the sperm that get into the uterus. It can stay in place for 3 5 years, depending on which type of IUD is used. PERMANENT METHODS OF CONTRACEPTION  Female tubal ligation This is when the woman's fallopian tubes are surgically sealed, tied, or blocked to prevent the egg from traveling to the uterus.  Hysteroscopic sterilization This involves placing a small coil or insert into each fallopian tube. Your doctor uses a technique called hysteroscopy to do the procedure. The device causes scar tissue to form. This results in permanent blockage of the fallopian tubes, so the sperm cannot fertilize the egg. It takes about 3 months after the procedure for the tubes to become blocked. You must use another form of birth control for these 3 months.  Female sterilization This is when the female has the tubes that carry sperm tied off (vasectomy).This blocks sperm from entering the vagina during sexual intercourse. After the procedure, the man can still ejaculate fluid (semen). NATURAL PLANNING METHODS  Natural family planning This is not having sexual intercourse or using a barrier method (condom, diaphragm, cervical cap) on days the woman could become pregnant.  Calendar method This is keeping track of the length of each menstrual cycle and identifying when you are fertile.  Ovulation method This is avoiding sexual intercourse during ovulation.  Symptothermal method This is  avoiding sexual intercourse during ovulation, using a thermometer and ovulation symptoms.  Post ovulation method This is timing sexual intercourse after you have ovulated. Regardless of which type or method of contraception you choose, it is important that you use condoms to protect against the transmission of sexually transmitted infections (STIs). Talk with your health care provider about which form of contraception is most appropriate for you. Document Released: 07/09/2005 Document Revised: 03/11/2013 Document Reviewed: 01/01/2013 [facility] Patient Information 2014 [place]. Hypertension  Watch for consistent Blood pressure > 140/90.  If BP stays in this range, consistently, please come in for further discussion of birth control options. As your heart beats, it forces blood through your arteries. This force is your blood pressure. If the pressure is too high, it is called hypertension (HTN) or high blood pressure. HTN is dangerous because you may have it and not know it. High blood pressure may mean that your heart has to work harder to pump blood. Your arteries may be narrow or stiff. The extra work puts you at risk for heart disease, stroke, and other problems.  Blood pressure consists of two numbers, a higher number over a lower, 110/72, for example. It is stated as "110 over 72." The ideal is below 120 for the top number (systolic) and under 80 for the bottom (diastolic). Write down  your blood pressure today. You should pay close attention to your blood pressure if you have certain conditions such as:  Heart failure.  Prior heart attack.  Diabetes  Chronic kidney disease.  Prior stroke.  Multiple risk factors for heart disease. To see if you have HTN, your blood pressure should be measured while you are seated with your arm held at the level of the heart. It should be measured at least twice. A one-time elevated blood pressure reading (especially in the Emergency Department) does  not mean that you need treatment. There may be conditions in which the blood pressure is different between your right and left arms. It is important to see your caregiver soon for a recheck. Most people have essential hypertension which means that there is not a specific cause. This type of high blood pressure may be lowered by changing lifestyle factors such as:  Stress.  Smoking.  Lack of exercise.  Excessive weight.  Drug/tobacco/alcohol use.  Eating less salt. Most people do not have symptoms from high blood pressure until it has caused damage to the body. Effective treatment can often prevent, delay or reduce that damage. TREATMENT  When a cause has been identified, treatment for high blood pressure is directed at the cause. There are a large number of medications to treat HTN. These fall into several categories, and your caregiver will help you select the medicines that are best for you. Medications may have side effects. You should review side effects with your caregiver. If your blood pressure stays high after you have made lifestyle changes or started on medicines,   Your medication(s) may need to be changed.  Other problems may need to be addressed.  Be certain you understand your prescriptions, and know how and when to take your medicine.  Be sure to follow up with your caregiver within the time frame advised (usually within two weeks) to have your blood pressure rechecked and to review your medications.  If you are taking more than one medicine to lower your blood pressure, make sure you know how and at what times they should be taken. Taking two medicines at the same time can result in blood pressure that is too low. SEEK IMMEDIATE MEDICAL CARE IF:  You develop a severe headache, blurred or changing vision, or confusion.  You have unusual weakness or numbness, or a faint feeling.  You have severe chest or abdominal pain, vomiting, or breathing problems. MAKE SURE YOU:     Understand these instructions.  Will watch your condition.  Will get help right away if you are not doing well or get worse. Document Released: 07/09/2005 Document Revised: 10/01/2011 Document Reviewed: 02/27/2008 [facility] Patient Information 2014 [place].

## 2013-12-09 NOTE — Telephone Encounter (Signed)
ERROR NOTE

## 2013-12-23 IMAGING — US US OB < 14 WEEKS - US OB TV
1 series · 13 of 28 positions shown · non-contrast
Comparison: none

REASON FOR EXAM: pelvis pain - R side - 5 weeks preg - HCG 7366 - eval
pregnancy
COMMENTS:

[Series 1: us ob < 14 weeks - us ob tv · 0.30mm/px · 13 of 76 slices shown]
[im 3/76]
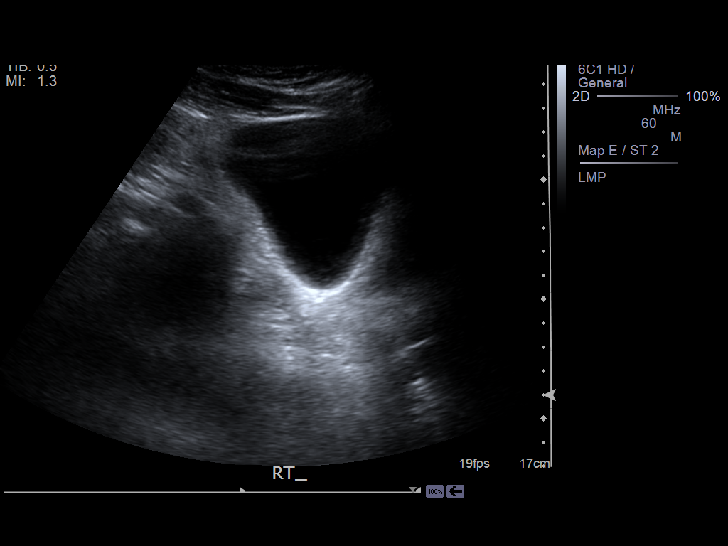
[im 9/76]
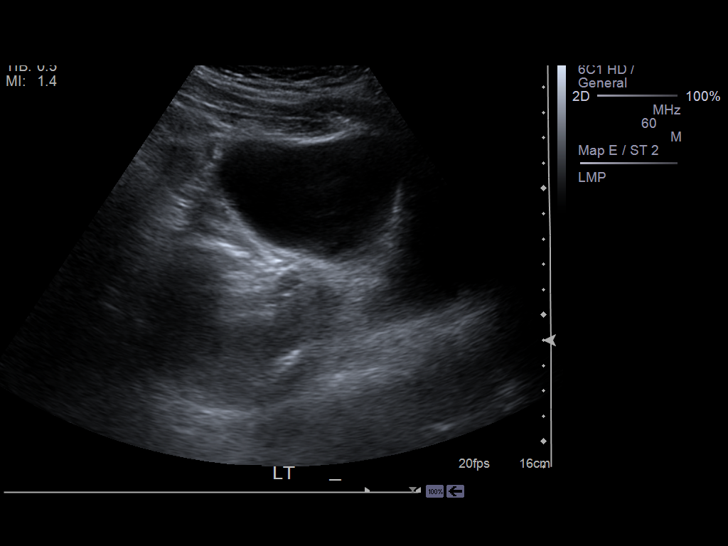
[im 14/76]
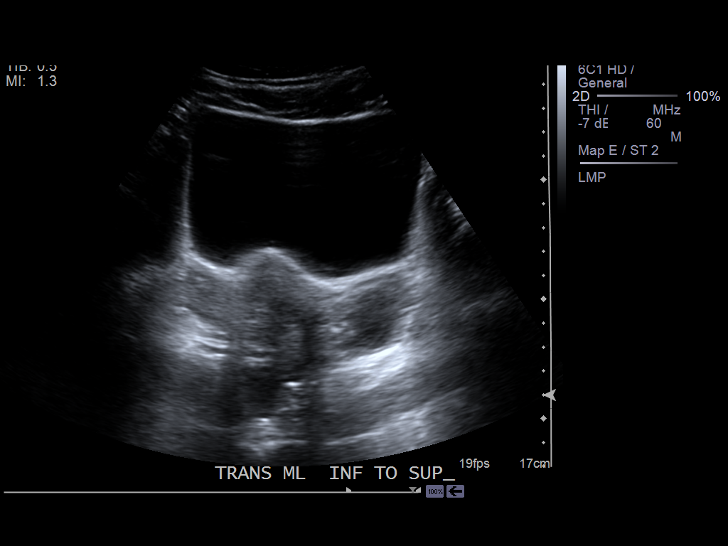
[im 20/76]
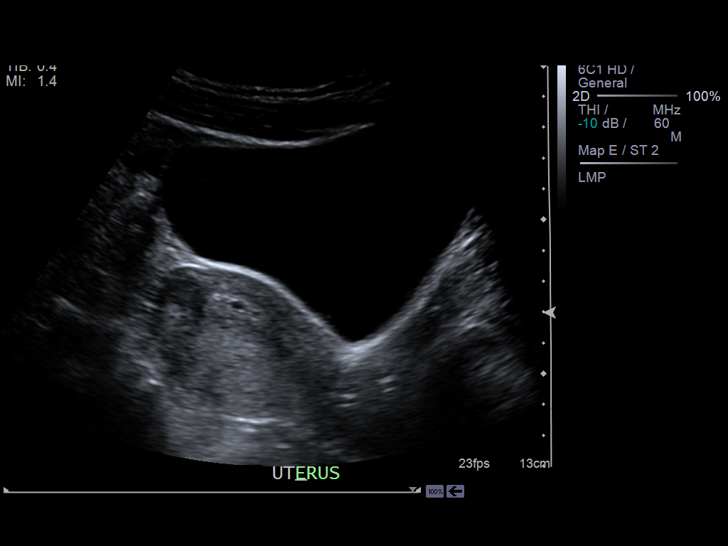
[im 26/76]
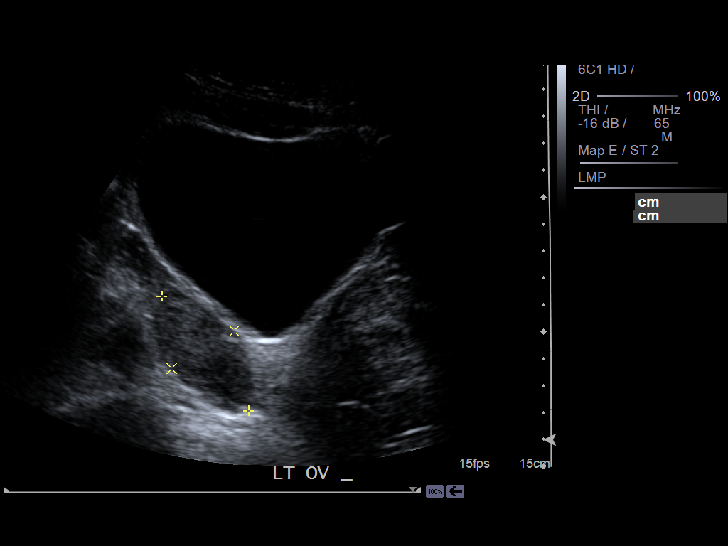
[im 31/76]
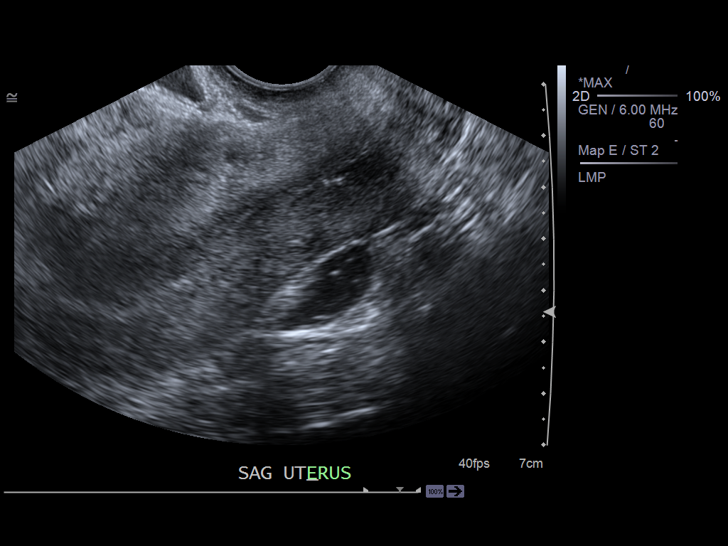
[im 39/76]
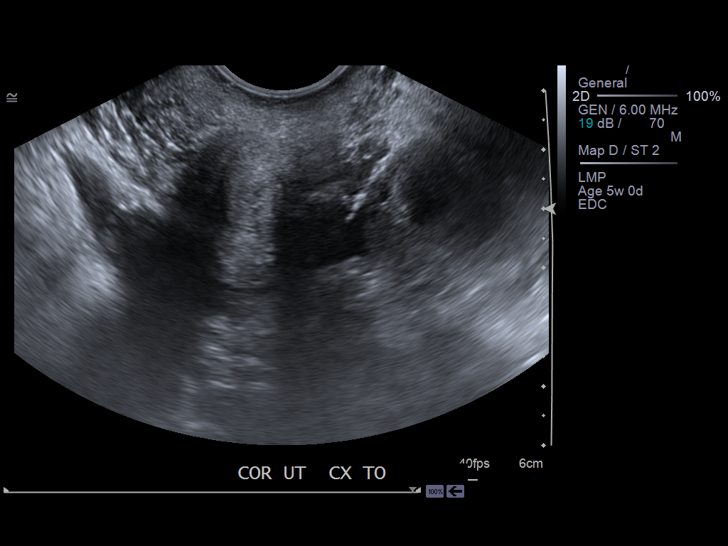
[im 45/76]
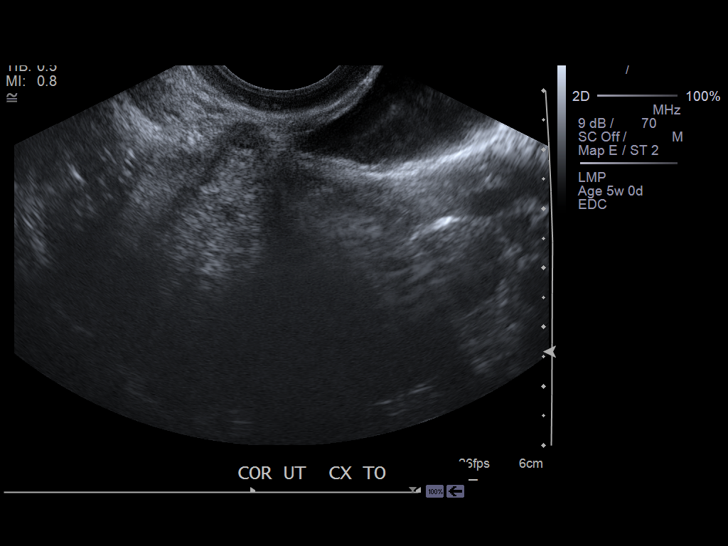
[im 51/76]
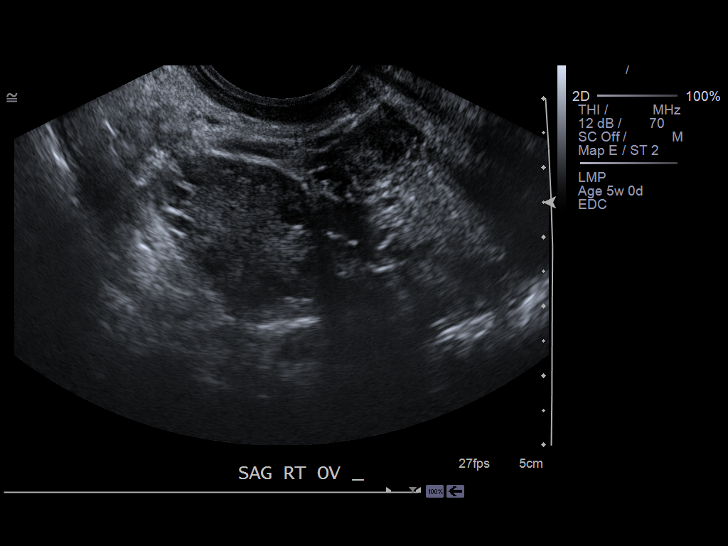
[im 56/76]
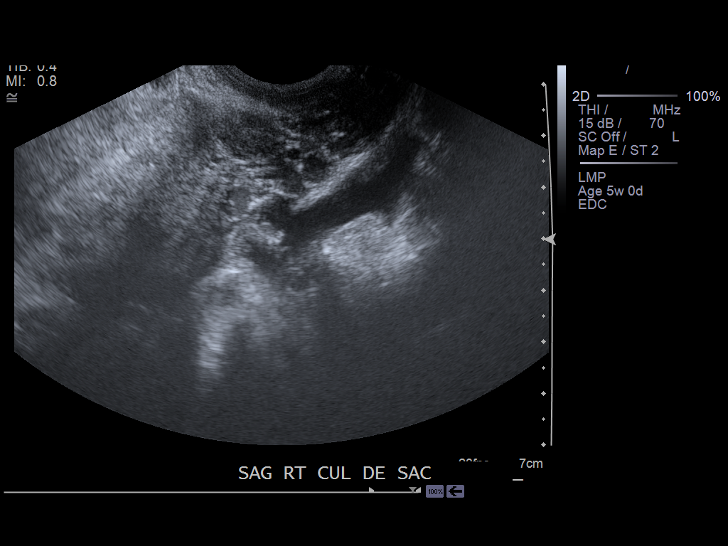
[im 62/76]
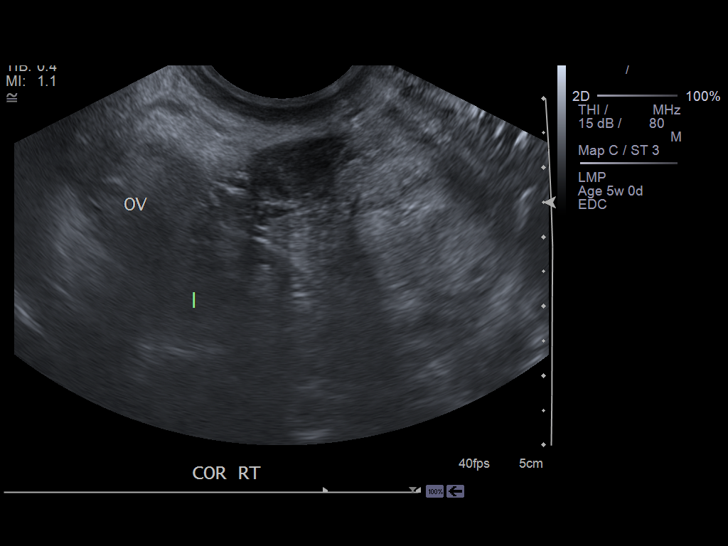
[im 67/76]
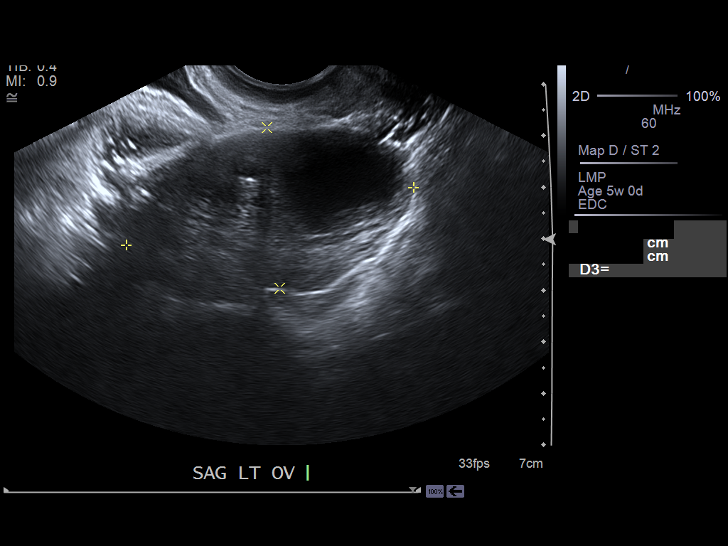
[im 73/76]
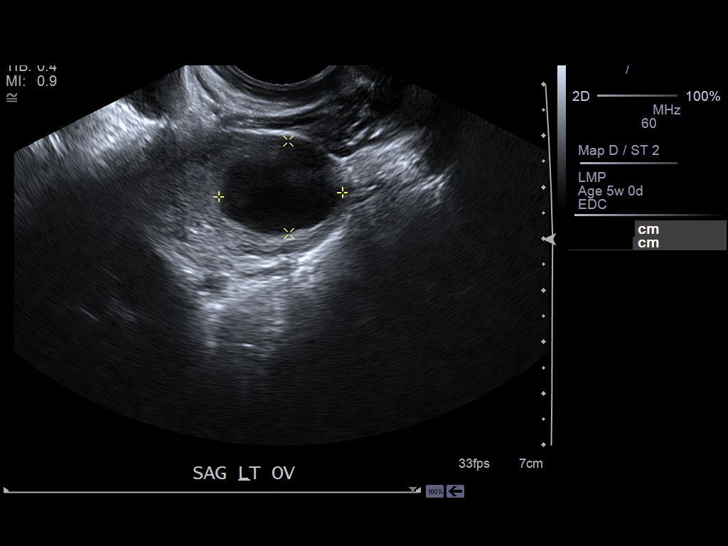

[13 of 28 positions shown; findings below may reference images not displayed]

PROCEDURE:     US  - US OB LESS THAN 14 WEEKS/W TRANS  - January 05, 2012 [DATE]

RESULT:     Within the uterus and there is a fluid-filled structure
measuring 0.56 cm in greatest dimension corresponding to a 5 week 2 day
gestation. However, there is no visible fetal pole or yolk sac. There is a
right adnexal mass measuring 1.2 by 1.5 x 0.9 cm. This lies between the
right ovary and uterus. It demonstrates some vascular flow but does not
appear be a classic "ring of fire". The right ovary measures 2.9 x 2 x
cm. The left ovary measures 5.7 x 3.3 x 3.1 cm and contains a 2.4 x 1.8 x
1.8 cm diameter cyst. There is minimal splitting of the central echo complex
of the right kidney which likely reflects very mild hydronephrosis.
IMPRESSION: 1. There is a fluid-filled sac in the endometrial cavity measuring 0.56 cm
which may reflect a  5wk 2day gestational sac, but there is no evidence of a
fetal pole or yolk sac. Followup ultrasound examinations and serial beta-hCG
determinations will be needed.
2. In the right adnexa there is a 1.52 maximal dimension adnexal mass
demonstrating some vascularity. This is not classic for an ectopic pregnancy
but this cannot be dogmatically excluded.
3. There is a left ovarian cyst measuring 2.4 cm in diameter.
4. There is minimal hydronephrosis on the right.

## 2014-05-24 ENCOUNTER — Encounter: Payer: Self-pay | Admitting: Family Medicine

## 2014-06-22 DIAGNOSIS — IMO0001 Reserved for inherently not codable concepts without codable children: Secondary | ICD-10-CM

## 2014-06-22 DIAGNOSIS — E785 Hyperlipidemia, unspecified: Secondary | ICD-10-CM

## 2014-06-22 DIAGNOSIS — E1165 Type 2 diabetes mellitus with hyperglycemia: Secondary | ICD-10-CM | POA: Insufficient documentation

## 2014-06-22 HISTORY — DX: Hyperlipidemia, unspecified: E78.5

## 2014-06-22 HISTORY — DX: Reserved for inherently not codable concepts without codable children: IMO0001

## 2014-06-30 ENCOUNTER — Other Ambulatory Visit (INDEPENDENT_AMBULATORY_CARE_PROVIDER_SITE_OTHER): Payer: BC Managed Care – PPO

## 2014-06-30 ENCOUNTER — Other Ambulatory Visit: Payer: BC Managed Care – PPO

## 2014-06-30 DIAGNOSIS — Z13220 Encounter for screening for lipoid disorders: Secondary | ICD-10-CM

## 2014-06-30 DIAGNOSIS — R5383 Other fatigue: Secondary | ICD-10-CM

## 2014-06-30 LAB — LIPID PANEL
CHOLESTEROL: 245 mg/dL — AB (ref 0–200)
HDL: 65.5 mg/dL (ref >39.00)
LDL Cholesterol: 160 mg/dL — ABNORMAL HIGH (ref 0–99)
NONHDL: 179.5
TRIGLYCERIDES: 98 mg/dL (ref 0.0–149.0)
Total CHOL/HDL Ratio: 4
VLDL: 19.6 mg/dL (ref 0.0–40.0)

## 2014-06-30 LAB — CBC WITH DIFFERENTIAL/PLATELET
BASOS ABS: 0.1 10*3/uL (ref 0.0–0.1)
Basophils Relative: 1.2 % (ref 0.0–3.0)
EOS PCT: 1.6 % (ref 0.0–5.0)
Eosinophils Absolute: 0.1 10*3/uL (ref 0.0–0.7)
HCT: 38.2 % (ref 36.0–46.0)
Hemoglobin: 12.6 g/dL (ref 12.0–15.0)
LYMPHS PCT: 37.7 % (ref 12.0–46.0)
Lymphs Abs: 2 10*3/uL (ref 0.7–4.0)
MCHC: 33 g/dL (ref 30.0–36.0)
MCV: 85.7 fl (ref 78.0–100.0)
MONOS PCT: 6.5 % (ref 3.0–12.0)
Monocytes Absolute: 0.3 10*3/uL (ref 0.1–1.0)
NEUTROS PCT: 53 % (ref 43.0–77.0)
Neutro Abs: 2.8 10*3/uL (ref 1.4–7.7)
PLATELETS: 221 10*3/uL (ref 150.0–400.0)
RBC: 4.46 Mil/uL (ref 3.87–5.11)
RDW: 15 % (ref 11.5–15.5)
WBC: 5.4 10*3/uL (ref 4.0–10.5)

## 2014-06-30 LAB — BASIC METABOLIC PANEL
BUN: 13 mg/dL (ref 6–23)
CHLORIDE: 103 meq/L (ref 96–112)
CO2: 24 mEq/L (ref 19–32)
Calcium: 8.7 mg/dL (ref 8.4–10.5)
Creatinine, Ser: 0.6 mg/dL (ref 0.4–1.2)
GFR: 135.74 mL/min (ref >60.00)
GLUCOSE: 156 mg/dL — AB (ref 70–99)
POTASSIUM: 4.2 meq/L (ref 3.5–5.1)
SODIUM: 134 meq/L — AB (ref 135–145)

## 2014-06-30 LAB — HEPATIC FUNCTION PANEL
ALBUMIN: 3.9 g/dL (ref 3.5–5.2)
ALT: 34 U/L (ref 0–35)
AST: 22 U/L (ref 0–37)
Alkaline Phosphatase: 52 U/L (ref 39–117)
Bilirubin, Direct: 0 mg/dL (ref 0.0–0.3)
TOTAL PROTEIN: 7.2 g/dL (ref 6.0–8.3)
Total Bilirubin: 0.5 mg/dL (ref 0.2–1.2)

## 2014-06-30 LAB — TSH: TSH: 1.06 u[IU]/mL (ref 0.35–4.50)

## 2014-07-01 ENCOUNTER — Other Ambulatory Visit (INDEPENDENT_AMBULATORY_CARE_PROVIDER_SITE_OTHER): Payer: BC Managed Care – PPO

## 2014-07-01 DIAGNOSIS — R7309 Other abnormal glucose: Secondary | ICD-10-CM

## 2014-07-01 DIAGNOSIS — R739 Hyperglycemia, unspecified: Secondary | ICD-10-CM

## 2014-07-01 LAB — HEMOGLOBIN A1C: Hgb A1c MFr Bld: 7.4 % — ABNORMAL HIGH (ref 4.6–6.5)

## 2014-07-07 ENCOUNTER — Encounter: Payer: Self-pay | Admitting: Family Medicine

## 2014-07-19 ENCOUNTER — Telehealth: Payer: Self-pay | Admitting: *Deleted

## 2014-07-19 ENCOUNTER — Encounter: Payer: Self-pay | Admitting: Family Medicine

## 2014-07-19 ENCOUNTER — Ambulatory Visit (INDEPENDENT_AMBULATORY_CARE_PROVIDER_SITE_OTHER): Payer: BC Managed Care – PPO | Admitting: Family Medicine

## 2014-07-19 VITALS — BP 116/82 | HR 88 | Temp 98.5°F | Ht 68.0 in | Wt 229.2 lb

## 2014-07-19 DIAGNOSIS — Z0000 Encounter for general adult medical examination without abnormal findings: Secondary | ICD-10-CM

## 2014-07-19 DIAGNOSIS — E66811 Obesity, class 1: Secondary | ICD-10-CM

## 2014-07-19 DIAGNOSIS — E785 Hyperlipidemia, unspecified: Secondary | ICD-10-CM | POA: Insufficient documentation

## 2014-07-19 DIAGNOSIS — E119 Type 2 diabetes mellitus without complications: Secondary | ICD-10-CM | POA: Insufficient documentation

## 2014-07-19 DIAGNOSIS — IMO0001 Reserved for inherently not codable concepts without codable children: Secondary | ICD-10-CM

## 2014-07-19 DIAGNOSIS — E669 Obesity, unspecified: Secondary | ICD-10-CM

## 2014-07-19 DIAGNOSIS — E1165 Type 2 diabetes mellitus with hyperglycemia: Secondary | ICD-10-CM

## 2014-07-19 DIAGNOSIS — Z23 Encounter for immunization: Secondary | ICD-10-CM

## 2014-07-19 DIAGNOSIS — M931 Kienbock's disease of adults: Secondary | ICD-10-CM | POA: Insufficient documentation

## 2014-07-19 MED ORDER — ATORVASTATIN CALCIUM 40 MG PO TABS: 40.0000 mg | ORAL_TABLET | Freq: Every day | ORAL | Status: DC

## 2014-07-19 MED ORDER — METFORMIN HCL ER 500 MG PO TB24: 500.0000 mg | ORAL_TABLET | Freq: Every day | ORAL | Status: DC

## 2014-07-19 NOTE — Telephone Encounter (Signed)
Pt called and left vm that she was seen earlier today, and prescribed metformin, and atorvastatin, but pharmacy only received rx for metformin. I returned call to pt, and left message apologizing for the mixup, and to inform her that I would call in rx to pharmacy. Rx called in to [place].

## 2014-07-19 NOTE — Progress Notes (Signed)
Dr. Frederico Hamman T. Jadence Kinlaw, MD, [facility] and Sports Medicine [place], [address] Phone: [phone] Fax: [phone]  07/19/2014  Patient: Priscilla King, MRN: [id number], DOB: Jul 25, 1975, 38 y.o.  Primary Physician:  Owens Loffler, MD  Chief Complaint: Annual Exam  Subjective:   Priscilla King is a 38 y.o. pleasant patient who presents for CPX and several new-onset acute on chronic diagnoses:  Health Maintenance Summary Reviewed and updated, unless pt declines services.  Tobacco History Reviewed. Non-smoker Alcohol: No concerns, no excessive use Exercise Habits: limited some by joint pain STD concerns: none Drug Use: None Birth control method: OCP Menses regular: yes Lumps or breast concerns: no Breast Cancer Family History: no  Diabetes Mellitus: new onset diagnosis, but the patient did have gestational diabetes when she was pregnant, and she has a child who is 66 months of age. 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 She took metformin during her pregnancy Compliance with diet: fair Exercise: minimal / intermittent Avg blood sugars at home: not checking Foot problems: none Hypoglycemia: none No nausea, vomitting, blurred vision, polyuria.  Lab Results  Component Value Date   HGBA1C 7.4* 07/01/2014   Lab Results  Component Value Date   LDLCALC 160* 06/30/2014   CREATININE 0.6 06/30/2014    Wt Readings from Last 3 Encounters:  07/19/14 229 lb 4 oz (103.987 kg)  10/27/13 221 lb 12.8 oz (100.608 kg)  09/22/13 217 lb (98.431 kg)    Body mass index is 34.87 kg/(m^2).   Lipids: new-onset hyperlipidemia, and her total cholesterol is gone up approximately 60 pounds compared to last time that we checked it. Prior to now she has never been on any kind of  medication.  Panel reviewed with patient.  Lipids:    Component Value Date/Time   CHOL 245* 06/30/2014 1503   TRIG 98.0 06/30/2014 1503   HDL 65.50 06/30/2014 1503   VLDL 19.6 06/30/2014 1503   CHOLHDL 4 06/30/2014 1503    Lab Results  Component Value Date   ALT 34 06/30/2014   AST 22 06/30/2014   ALKPHOS 52 06/30/2014   BILITOT 0.5 06/30/2014     Flu - done Prevnar-13  Going to [facility] Maintenance  Topic Date Due  . PNEUMOCOCCAL POLYSACCHARIDE VACCINE (1) 02/12/1978  . URINE MICROALBUMIN  02/12/1986  . HEMOGLOBIN A1C  12/31/2014  . INFLUENZA VACCINE  02/21/2015  . FOOT EXAM  07/20/2015  . OPHTHALMOLOGY EXAM  07/20/2015  . PAP SMEAR  04/01/2016  . TETANUS/TDAP  06/05/2022    Immunization History  Administered Date(s) Administered  . Influenza Split 06/05/2012  . Pneumococcal Conjugate-13 07/19/2014  . Tdap 06/05/2012   Patient Active Problem List   Diagnosis Date Noted  . Diabetes mellitus type 2, uncontrolled, without complications   . Hyperlipidemia LDL goal <70   . Kienbock's avascular necrosis of lunate, adult   . PCOS (polycystic ovarian syndrome) 04/15/2012  . OTHER SPECIFIED FORMS OF HEARING LOSS 03/10/2009  . PATELLO-FEMORAL SYNDROME 04/16/2008   Past Medical History  Diagnosis Date  . Seronegative arthritis     possible, [place], [place]  . History of polycystic ovaries   . Gestational diabetes     metformin  . Obesity   . Diabetes mellitus type 2, uncontrolled, without complications  06/2014  . Hyperlipidemia LDL goal <70 06/2014  . Kienbock's avascular necrosis of lunate, adult    Past Surgical History  Procedure Laterality Date  . Cesarean section N/A 08/31/2012    Procedure: CESAREAN SECTION;  Surgeon: Mora Bellman, MD;  Location: [facility];  Service: Obstetrics;  Laterality: N/A;   History   Social History  . Marital Status: Married    Spouse Name: N/A    Number of Children: 1  . Years of Education: N/A    Occupational History  . 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    [employer]     Social History Main Topics  . Smoking status: Never Smoker   . Smokeless tobacco: Never Used  . Alcohol Use: No  . Drug Use: No  . Sexual Activity: Yes   Other Topics Concern  . 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 Stroke Mother     CVA (TIA)  . Diabetes Mother   . Diabetes Maternal Grandmother   . Hypertension Maternal Grandmother   . Diabetes Paternal Grandmother   . Hypertension Paternal Grandmother    Allergies  Allergen Reactions  . Vicodin [Hydrocodone-Acetaminophen] Swelling    Tongue swelling and headache    Medication list has been reviewed and updated.   General: Denies fever, chills, sweats. No significant weight loss.Generalized aching and not feeling all that well. Eyes: Denies blurring,significant itching ENT: Denies earache, sore throat, and hoarseness.  Cardiovascular: Denies chest pains, palpitations, dyspnea on exertion,  Respiratory: Denies cough, dyspnea at rest,wheeezing Breast: no concerns about lumps GI: Denies nausea, vomiting, diarrhea, constipation, change in bowel habits, abdominal pain, melena, hematochezia GU: Denies dysuria, hematuria, urinary hesitancy, nocturia, denies STD risk, no concerns about discharge Musculoskeletal: multiple joints, generalized aching. Derm: Denies rash, itching Neuro: Denies  paresthesias, frequent falls, frequent headaches Psych: Denies depression, anxiety Endocrine: Denies cold intolerance, heat intolerance, polydipsia Heme: Denies enlarged lymph nodes Allergy: No hayfever  Objective:   BP 116/82 mmHg  Pulse 88  Temp(Src) 98.5 F (36.9 C) (Oral)  Ht '5\' 8"'  (1.727 m)  Wt 229 lb 4 oz (103.987 kg)  BMI 34.87 kg/m2  LMP 07/09/2014 No exam data present  GEN: well developed, well  nourished, no acute distress Eyes: conjunctiva and lids normal, PERRLA, EOMI ENT: TM clear, nares clear, oral exam WNL Neck: supple, no lymphadenopathy, no thyromegaly, no JVD Pulm: clear to auscultation and percussion, respiratory effort normal CV: regular rate and rhythm, S1-S2, no murmur, rub or gallop, no bruits Chest: no scars, masses, no lumps BREAST: breast exam declined GI: soft, non-tender; no hepatosplenomegaly, masses; active bowel sounds all quadrants GU: GU exam declined Lymph: no cervical, axillary or inguinal adenopathy MSK: gait normal, muscle tone and strength WNL, no joint swelling, effusions, discoloration, crepitus  SKIN: clear, good turgor, color WNL, no rashes, lesions, or ulcerations Neuro: normal mental status, normal strength, sensation, and motion Psych: alert; oriented to person, place and time, normally interactive and not anxious or depressed in appearance.   All labs reviewed with patient. Lipids:    Component Value Date/Time   CHOL 245* 06/30/2014 1503   TRIG 98.0 06/30/2014 1503   HDL 65.50 06/30/2014 1503   VLDL 19.6 06/30/2014 1503   CHOLHDL 4 06/30/2014 1503   CBC: CBC Latest Ref Rng 06/30/2014  09/01/2012 08/30/2012  WBC 4.0 - 10.5 K/uL 5.4 17.8(H) 7.3  Hemoglobin 12.0 - 15.0 g/dL 12.6 10.5(L) 11.1(L)  Hematocrit 36.0 - 46.0 % 38.2 31.9(L) 34.0(L)  Platelets 150.0 - 400.0 K/uL 221.0 202 790    Basic Metabolic Panel:    Component Value Date/Time   NA 134* 06/30/2014 1503   K 4.2 06/30/2014 1503   CL 103 06/30/2014 1503   CO2 24 06/30/2014 1503   BUN 13 06/30/2014 1503   CREATININE 0.6 06/30/2014 1503   GLUCOSE 156* 06/30/2014 1503   CALCIUM 8.7 06/30/2014 1503   Hepatic Function Latest Ref Rng 06/30/2014 07/13/2010  Total Protein 6.0 - 8.3 g/dL 7.2 7.3  Albumin 3.5 - 5.2 g/dL 3.9 3.5  AST 0 - 37 U/L 22 26  ALT 0 - 35 U/L 34 34  Alk Phosphatase 39 - 117 U/L 52 49  Total Bilirubin 0.2 - 1.2 mg/dL 0.5 0.4  Bilirubin, Direct 0.0 - 0.3  mg/dL 0.0 0.1    Lab Results  Component Value Date   TSH 1.06 06/30/2014   No results found.  Assessment and Plan:   Healthcare maintenance  Need for vaccination with 13-polyvalent pneumococcal conjugate vaccine - Plan: Pneumococcal conjugate vaccine 13-valent  Diabetes mellitus type 2, uncontrolled, without complications: >38 minutes spent in face to face time with patient, >50% spent in counselling or coordination of care, We spent the majority of this time talking about diabetes, but we also spoke about her obesity and hyperlipidemia. We discussed diet, weight control, and pathophysiology of type 2 diabetes. We also discussed that likely the patient would need to take some form of diabetic medication lifelong, but it is potentially possible to reverse this process with an excessive amount of weight loss. She understands and she is very familiar with this given her gestational diabetes and she is open to taking oral medications. We are going to start with some metformin, only taking 500 mg of the XR form daily and see her back in 3 months.  Hyperlipidemia LDL goal <70: additionally, lipids have gone up quite a bit, and given her new onset diagnosis of diabetes, she asked for my personal recommendation would be to do for this, and I stated that I thought that it was the standard of care at this point to start a statin medication. Given her current levels, we are going to start Lipitor 40 mg. Recheck in 3 months  Health Maintenance Exam: The patient's preventative maintenance and recommended screening tests for an annual wellness exam were reviewed in full today. Brought up to date unless services declined.  Counselled on the importance of diet, exercise, and its role in overall health and mortality. The patient's FH and SH was reviewed, including their home life, tobacco status, and drug and alcohol status.  Follow-up: Return in about 3 months (around 10/18/2014) for labs before.  New  Prescriptions   ATORVASTATIN (LIPITOR) 40 MG TABLET    Take 1 tablet (40 mg total) by mouth daily.   METFORMIN (GLUCOPHAGE-XR) 500 MG 24 HR TABLET    Take 1 tablet (500 mg total) by mouth daily with breakfast.   Orders Placed This Encounter  Procedures  . Pneumococcal conjugate vaccine 13-valent    Signed,  Kiri Hinderliter T. Sophiea Ueda, MD   Patient's Medications  New Prescriptions   ATORVASTATIN (LIPITOR) 40 MG TABLET    Take 1 tablet (40 mg total) by mouth daily.   METFORMIN (GLUCOPHAGE-XR) 500 MG 24 HR TABLET    Take 1 tablet (500  mg total) by mouth daily with breakfast.  Previous Medications   LEVONORGESTREL-ETHINYL ESTRADIOL (SEASONALE) 0.15-0.03 MG TABLET    Take 1 tablet by mouth daily.  Modified Medications   No medications on file  Discontinued Medications   No medications on file   Patient Instructions   The [place] Glycemic Diet (Source: [facility], 2006)  Low Glycemic Foods (20-49) (Decrease risk of developing heart disease)  Best for Diabetes: Eat Mostly these  Breakfast Cereals: All-Bran All-Bran Fruit 'n Oats Fiber One Oatmeal (not instant) Oat bran  Fruits and fruit juices: (Limit to 1-2 servings per day) Apples Apricots (fresh & dried) Blackberries Blueberries Cherries Cranberries Peaches Pears Plums Prunes Grapefruit Raspberries Strawberries Tangerine  Juices: Apple juice Grapefruit juice Tomato juice  Beans and legumes (fresh-cooked): Black-eyed peas Butter beans Chick peas Lentils  Green beans Lima beans Kidney beans Navy beans Pinto beans Snow peas  Non-starchy vegetables: Asparagus, avocado, broccoli, cabbage, cauliflower, celery, cucumber, greens, lettuce, mushrooms, peppers, tomatoes, okra, onions, spinach, summer squash  Grains: Barley Bulgur Rye Wild rice  Nuts and oils : Almonds Peanuts Sunflower seeds Hazelnuts Pecans Walnuts Oils that are liquid at room temperature  Dairy, fish, meat, soy, and  eggs: Milk, skim Lowfat cheese Yogurt, lowfat, fruit sugar sweetened Lean red meat Fish  Skinless chicken & [place] Shellfish Egg whites (up to 3 daily) Soy products  Egg yolks (up to 7 or _____ per week) Moderate Glycemic Foods (50-69)  OK sometimes with diabetes  Breakfast Cereals: Bran Buds Bran Chex Just Right Mini-Wheats  Special K Swiss muesli  Fruits: Banana (under-ripe) Dates Figs Grapes Kiwi Mango Oranges Raisins  Fruit Juices: Cranberry juice Orange juice  Beans and legumes: [place]-type baked beans Canned pinto, kidney, or navy beans Green peas  Vegetables: Beets Carrots  Sweet potato Yam Corn on the cob  Breads: Pita (pocket) bread Oat bran bread Pumpernickel bread Rye bread Wheat bread, high fiber   Grains: Cornmeal Rice, brown Rice, white Couscous  Pasta: Macaroni Pizza, cheese Ravioli, meat filled Spaghetti, white   Nuts: Cashews Macadamia  Snacks: Chocolate Ice cream, lowfat Muffin Popcorn High Glycemic Foods (70-100)  Rare: Eat occaisionally with diabetes  THESE ARE THE WORST KIND OF FOODS FOR YOUR DIABETES  Breakfast Cereals: Cheerios Corn Chex Corn Flakes Cream of Wheat Grape Nuts Grape Nut Flakes Grits Nutri-Grain Puffed Rice Puffed Wheat Rice Chex Rice Krispies Shredded Wheat Team Total  Fruits: Pineapple Watermelon Banana (over-ripe) Beverages: Sodas, sweet tea, pineapple juice  Vegetables: Potato, baked, boiled, fried, mashed [place] fries Canned or frozen corn Parsnips Winter squash  Breads: Most breads (white and whole grain) Bagels Bread sticks Bread stuffing Kaiser roll Dinner rolls  Grains: Rice, instant Tapioca, with milk Candy and most cookies  Snacks: Donuts Corn chips Jelly beans Pretzels Pastries

## 2014-07-19 NOTE — Patient Instructions (Signed)

## 2014-07-19 NOTE — Progress Notes (Signed)
Pre visit review using our clinic review tool, if applicable. No additional management support is needed unless otherwise documented below in the visit note. 

## 2014-10-11 ENCOUNTER — Other Ambulatory Visit: Payer: BC Managed Care – PPO

## 2014-10-18 ENCOUNTER — Ambulatory Visit: Payer: BC Managed Care – PPO | Admitting: Family Medicine

## 2014-10-18 ENCOUNTER — Other Ambulatory Visit (INDEPENDENT_AMBULATORY_CARE_PROVIDER_SITE_OTHER): Payer: BLUE CROSS/BLUE SHIELD

## 2014-10-18 DIAGNOSIS — E785 Hyperlipidemia, unspecified: Secondary | ICD-10-CM

## 2014-10-18 DIAGNOSIS — E119 Type 2 diabetes mellitus without complications: Secondary | ICD-10-CM | POA: Diagnosis not present

## 2014-10-18 LAB — LIPID PANEL
CHOLESTEROL: 112 mg/dL (ref 0–200)
HDL: 44.1 mg/dL (ref >39.00)
LDL Cholesterol: 58 mg/dL (ref 0–99)
NONHDL: 67.9
TRIGLYCERIDES: 52 mg/dL (ref 0.0–149.0)
Total CHOL/HDL Ratio: 3
VLDL: 10.4 mg/dL (ref 0.0–40.0)

## 2014-10-18 LAB — HEMOGLOBIN A1C: HEMOGLOBIN A1C: 5.7 % (ref 4.6–6.5)

## 2014-10-25 ENCOUNTER — Ambulatory Visit (INDEPENDENT_AMBULATORY_CARE_PROVIDER_SITE_OTHER): Payer: BLUE CROSS/BLUE SHIELD | Admitting: Family Medicine

## 2014-10-25 ENCOUNTER — Encounter: Payer: Self-pay | Admitting: Family Medicine

## 2014-10-25 VITALS — BP 100/76 | HR 84 | Temp 98.3°F | Ht 68.0 in | Wt 196.2 lb

## 2014-10-25 DIAGNOSIS — E785 Hyperlipidemia, unspecified: Secondary | ICD-10-CM | POA: Diagnosis not present

## 2014-10-25 DIAGNOSIS — E119 Type 2 diabetes mellitus without complications: Secondary | ICD-10-CM

## 2014-10-25 DIAGNOSIS — E1165 Type 2 diabetes mellitus with hyperglycemia: Secondary | ICD-10-CM | POA: Diagnosis not present

## 2014-10-25 DIAGNOSIS — IMO0001 Reserved for inherently not codable concepts without codable children: Secondary | ICD-10-CM

## 2014-10-25 LAB — MICROALBUMIN / CREATININE URINE RATIO
CREATININE, U: 433.9 mg/dL
MICROALB UR: 2.6 mg/dL — AB (ref 0.0–1.9)
MICROALB/CREAT RATIO: 0.6 mg/g (ref 0.0–30.0)

## 2014-10-25 NOTE — Progress Notes (Signed)
Dr. Frederico Hamman T. Yasmin Bronaugh, MD, [facility] Primary Care and Sports Medicine [place], [address] Phone: [phone] Fax: [phone]  10/25/2014  Patient: Priscilla King, MRN: [id number], DOB: [birth year], 39 y.o.  Primary Physician:  Owens Loffler, MD  Chief Complaint: Diabetes  Subjective:   Priscilla King is a 39 y.o. very pleasant female patient who presents with the following:  Diabetes Mellitus: Tolerating Medications: yes Compliance with diet: great Exercise: much increased activity Avg blood sugars at home: normal Foot problems: none Hypoglycemia: none No nausea, vomitting, blurred vision, polyuria.  Lab Results  Component Value Date   HGBA1C 5.7 10/18/2014   HGBA1C 7.4* 07/01/2014   Lab Results  Component Value Date   LDLCALC 58 10/18/2014   CREATININE 0.6 06/30/2014    Wt Readings from Last 3 Encounters:  10/25/14 196 lb 4 oz (89.018 kg)  07/19/14 229 lb 4 oz (103.987 kg)  10/27/13 221 lb 12.8 oz (100.608 kg)    Body mass index is 29.85 kg/(m^2).   Lipids: Doing well, stable. Tolerating meds fine with no SE. Panel reviewed with patient.  Lipids:    Component Value Date/Time   CHOL 112 10/18/2014 1227   TRIG 52.0 10/18/2014 1227   HDL 44.10 10/18/2014 1227   VLDL 10.4 10/18/2014 1227   CHOLHDL 3 10/18/2014 1227    Lab Results  Component Value Date   ALT 34 06/30/2014   AST 22 06/30/2014   ALKPHOS 52 06/30/2014   BILITOT 0.5 06/30/2014     Past Medical History, Surgical History, Social History, Family History, Problem List, Medications, and Allergies have been reviewed and updated if relevant.   GEN: No acute illnesses, no fevers, chills. GI: No n/v/d, eating normally Pulm: No SOB Interactive and getting along well at home.  Otherwise, ROS is as per the HPI.  Objective:   BP 100/76 mmHg  Pulse 84  Temp(Src) 98.3 F (36.8 C) (Oral)  Ht 5\' 8"  (1.727 m)  Wt 196 lb 4 oz (89.018 kg)  BMI 29.85 kg/m2   LMP 10/16/2014  GEN: WDWN, NAD, Non-toxic, A & O x 3 HEENT: Atraumatic, Normocephalic. Neck supple. No masses, No LAD. Ears and Nose: No external deformity. CV: RRR, No M/G/R. No JVD. No thrill. No extra heart sounds. PULM: CTA B, no wheezes, crackles, rhonchi. No retractions. No resp. distress. No accessory muscle use. EXTR: No c/c/e NEURO Normal gait.  PSYCH: Normally interactive. Conversant. Not depressed or anxious appearing.  Calm demeanor.   Laboratory and Imaging Data: Results for orders placed or performed in visit on 10/18/14  Lipid panel  Result Value Ref Range   Cholesterol 112 0 - 200 mg/dL   Triglycerides 52.0 0.0 - 149.0 mg/dL   HDL 44.10 >39.00 mg/dL   VLDL 10.4 0.0 - 40.0 mg/dL   LDL Cholesterol 58 0 - 99 mg/dL   Total CHOL/HDL Ratio 3    NonHDL 67.90   Hemoglobin A1c  Result Value Ref Range   Hgb A1c MFr Bld 5.7 4.6 - 6.5 %     Assessment and Plan:   Diabetes mellitus type 2, uncontrolled, without complications  Diabetes mellitus without complication - Plan: Microalbumin / creatinine urine ratio  Hyperlipidemia LDL goal <70  Lost 35 pounds We are going to do a trial off of meds to see how she does, recheck 3 mo  Follow-up: Return in about 3 months (around 01/24/2015).  New Prescriptions   No medications on file   No orders  of the defined types were placed in this encounter.    Signed,  Maud Deed. Angelica Frandsen, MD   Patient's Medications  New Prescriptions   No medications on file  Previous Medications   No medications on file  Modified Medications   No medications on file  Discontinued Medications   ATORVASTATIN (LIPITOR) 40 MG TABLET    Take 1 tablet (40 mg total) by mouth daily.   LEVONORGESTREL-ETHINYL ESTRADIOL (SEASONALE) 0.15-0.03 MG TABLET    Take 1 tablet by mouth daily.   METFORMIN (GLUCOPHAGE-XR) 500 MG 24 HR TABLET    Take 1 tablet (500 mg total) by mouth daily with breakfast.

## 2014-10-25 NOTE — Progress Notes (Signed)
Pre visit review using our clinic review tool, if applicable. No additional management support is needed unless otherwise documented below in the visit note. 

## 2015-01-20 ENCOUNTER — Other Ambulatory Visit: Payer: BLUE CROSS/BLUE SHIELD

## 2015-01-21 ENCOUNTER — Other Ambulatory Visit: Payer: Self-pay | Admitting: Family Medicine

## 2015-01-21 DIAGNOSIS — E119 Type 2 diabetes mellitus without complications: Secondary | ICD-10-CM

## 2015-01-21 DIAGNOSIS — E785 Hyperlipidemia, unspecified: Secondary | ICD-10-CM

## 2015-01-26 ENCOUNTER — Ambulatory Visit: Payer: BLUE CROSS/BLUE SHIELD | Admitting: Family Medicine

## 2015-02-03 ENCOUNTER — Encounter: Payer: Self-pay | Admitting: Obstetrics & Gynecology

## 2015-02-03 ENCOUNTER — Ambulatory Visit (INDEPENDENT_AMBULATORY_CARE_PROVIDER_SITE_OTHER): Payer: BLUE CROSS/BLUE SHIELD | Admitting: Obstetrics & Gynecology

## 2015-02-03 VITALS — BP 129/83 | HR 83 | Wt 185.0 lb

## 2015-02-03 DIAGNOSIS — Z01411 Encounter for gynecological examination (general) (routine) with abnormal findings: Secondary | ICD-10-CM

## 2015-02-03 DIAGNOSIS — E785 Hyperlipidemia, unspecified: Secondary | ICD-10-CM

## 2015-02-03 DIAGNOSIS — E282 Polycystic ovarian syndrome: Secondary | ICD-10-CM

## 2015-02-03 DIAGNOSIS — Z01419 Encounter for gynecological examination (general) (routine) without abnormal findings: Secondary | ICD-10-CM

## 2015-02-03 DIAGNOSIS — R102 Pelvic and perineal pain unspecified side: Secondary | ICD-10-CM

## 2015-02-03 DIAGNOSIS — E1165 Type 2 diabetes mellitus with hyperglycemia: Secondary | ICD-10-CM | POA: Diagnosis not present

## 2015-02-03 DIAGNOSIS — Z124 Encounter for screening for malignant neoplasm of cervix: Secondary | ICD-10-CM | POA: Diagnosis not present

## 2015-02-03 DIAGNOSIS — Z13220 Encounter for screening for lipoid disorders: Secondary | ICD-10-CM

## 2015-02-03 DIAGNOSIS — Z1151 Encounter for screening for human papillomavirus (HPV): Secondary | ICD-10-CM | POA: Diagnosis not present

## 2015-02-03 DIAGNOSIS — E119 Type 2 diabetes mellitus without complications: Secondary | ICD-10-CM

## 2015-02-03 DIAGNOSIS — IMO0001 Reserved for inherently not codable concepts without codable children: Secondary | ICD-10-CM

## 2015-02-03 MED ORDER — NORETHINDRONE 0.35 MG PO TABS: 1.0000 | ORAL_TABLET | Freq: Every day | ORAL | Status: DC

## 2015-02-03 NOTE — Progress Notes (Signed)
GYNECOLOGY CLINIC ANNUAL PREVENTATIVE CARE ENCOUNTER NOTE  Subjective:   Priscilla King is a 39 y.o. [id number] female here for a routine annual gynecologic exam.  Current complaints: occasional increased RLQ pain which she attributed to untreated PCOS.  OCPs were stopped due to HTN concerns, wants to discuss other management options.   Denies abnormal vaginal bleeding, discharge, pelvic pain, problems with intercourse or other gynecologic concerns.    Gynecologic History Patient's last menstrual period was 01/13/2015. Contraception: condoms Last Pap: 04/01/2013. Results were: normal with negative HRHPV  Obstetric History OB History  Gravida Para Term Preterm AB SAB TAB Ectopic Multiple Living  2 1 1  1  1   1     # Outcome Date GA Lbr Len/2nd Weight Sex Delivery Anes PTL Lv  2 Term 08/31/12 [redacted]w[redacted]d  8 lb 12.4 oz (3.98 kg) M CS-LVertical EPI  Y  1 TAB 1998    U    N     Comments: Elective AB      Past Medical History  Diagnosis Date  . Seronegative arthritis     possible, [place], [place]  . History of polycystic ovaries   . Gestational diabetes     metformin  . Obesity   . Diabetes mellitus type 2, uncontrolled, without complications 95/6213  . Hyperlipidemia LDL goal <70 06/2014  . Kienbock's avascular necrosis of lunate, adult     Past Surgical History  Procedure Laterality Date  . Cesarean section N/A 08/31/2012    Procedure: CESAREAN SECTION;  Surgeon: Mora Bellman, MD;  Location: [facility];  Service: Obstetrics;  Laterality: N/A;    No current outpatient prescriptions on file prior to visit.   No current facility-administered medications on file prior to visit.    Allergies  Allergen Reactions  . Vicodin [Hydrocodone-Acetaminophen] Swelling    Tongue swelling and headache    History   Social History  . Marital Status: Married    Spouse Name: N/A  . Number of Children: 1  . Years of Education: N/A   Occupational History  . 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    [employer]     Social History Main Topics  . Smoking status: Never Smoker   . Smokeless tobacco: Never Used  . Alcohol Use: No  . Drug Use: No  . Sexual Activity: Yes   Other Topics Concern  . 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 Stroke Mother     CVA (TIA)  . Diabetes Mother   . Diabetes Maternal Grandmother   . Hypertension Maternal Grandmother   . Diabetes Paternal Grandmother   . Hypertension Paternal Grandmother     The following portions of the patient's history were reviewed and updated as appropriate: allergies, current medications, past family history, past medical history, past social history, past surgical history and problem list.  Review of Systems A comprehensive review of systems was negative.   Objective:  BP 129/83 mmHg  Pulse 83  Wt 185 lb (83.915 kg)  LMP 01/13/2015 CONSTITUTIONAL: Well-developed, well-nourished female in no acute distress.  HENT:  Normocephalic, atraumatic, External right and left ear normal. Oropharynx is clear and moist EYES: Conjunctivae and EOM are normal. Pupils are equal, round, and reactive to light. No scleral icterus.  NECK: Normal  range of motion, supple, no masses.  Normal thyroid.  SKIN: Skin is warm and dry. No rash noted. Not diaphoretic. No erythema. No pallor. [place]: Alert and oriented to person, place, and time. Normal reflexes, muscle tone coordination. No cranial nerve deficit noted. PSYCHIATRIC: Normal mood and affect. Normal behavior. Normal judgment and thought content. CARDIOVASCULAR: Normal heart rate noted, regular rhythm RESPIRATORY: Clear to auscultation bilaterally. Effort and breath sounds normal, no problems with respiration noted. BREASTS: Symmetric in size. No masses, skin changes, nipple drainage, or lymphadenopathy. ABDOMEN: Soft, normal bowel sounds, no  distention noted.  No current tenderness, rebound or guarding.  PELVIC: Normal appearing external genitalia; normal appearing vaginal mucosa and cervix.  No abnormal discharge noted.  Pap smear obtained.  Normal uterine size, no other palpable masses, no uterine or adnexal tenderness. MUSCULOSKELETAL: Normal range of motion. No tenderness.  No cyanosis, clubbing, or edema.  2+ distal pulses.   Assessment:  Annual gynecologic examination with pap smear PCOS management DM and hyperlipidemia labs   Plan:  Will follow up results of pap smear and manage accordingly. Counseled patient about management of PCOS, recommended weight loss in addition to hormonal supression. Given concern about HTN, discussed progestin only therapy such as pills or Mirena, patient does not want Depo Provera. Will try pills (Norethindrone daily) for now, patient will consider Mirena.  Will also get pelvic ultrasound to evaluate her right ovary.  If patient desires Mirena, she will call to make appointment. Of note, HgA1C and lipid panel were drawn today for surveillance of her DM and hyperlipidemia; will follow up with PCP Routine preventative health maintenance measures emphasized. Please refer to After Visit Summary for other counseling recommendations.    Verita Schneiders, MD, [place] Attending [place] for [employer], [place]

## 2015-02-03 NOTE — Patient Instructions (Signed)
Intrauterine Device Information An intrauterine device (IUD) is inserted into your uterus to prevent pregnancy. There are two types of IUDs available:   Copper IUD--This type of IUD is wrapped in copper wire and is placed inside the uterus. Copper makes the uterus and fallopian tubes produce a fluid that kills sperm. The copper IUD can stay in place for 10 years.  Hormone IUD/Mirena--This type of IUD contains the hormone progestin (synthetic progesterone). The hormone thickens the cervical mucus and prevents sperm from entering the uterus. It also thins the uterine lining to prevent implantation of a fertilized egg. The hormone can weaken or kill the sperm that get into the uterus. Mirena IUD can stay in place for 5 years. Your health care provider will make sure you are a good candidate for a contraceptive IUD. Discuss with your health care provider the possible side effects.  ADVANTAGES OF AN INTRAUTERINE DEVICE  IUDs are highly effective, reversible, long acting, and low maintenance.   There are no estrogen-related side effects.   An IUD can be used when breastfeeding.   IUDs are not associated with weight gain.   The copper IUD works immediately after insertion.   The hormone IUD works right away if inserted within 7 days of your period starting. You will need to use a backup method of birth control for 7 days if the hormone IUD is inserted at any other time in your cycle.  The copper IUD does not interfere with your female hormones.   The hormone IUD can make heavy menstrual periods lighter and decrease cramping.   The hormone IUD can be used for 3 or 5 years.   The copper IUD can be used for 10 years. DISADVANTAGES OF AN INTRAUTERINE DEVICE  The hormone IUD can be associated with irregular bleeding patterns.   The copper IUD can make your menstrual flow heavier and more painful.   You may experience cramping and vaginal bleeding after insertion.  Document  Released: 06/12/2004 Document Revised: 03/11/2013 Document Reviewed: 12/28/2012 [facility] Patient Information 2015 [place], [place]. This information is not intended to replace advice given to you by your health care provider. Make sure you discuss any questions you have with your health care provider.   Intrauterine Device Insertion Most often, an intrauterine device (IUD) is inserted into the uterus to prevent pregnancy. There are 2 types of IUDs available:  Copper IUD--This type of IUD creates an environment that is not favorable to sperm survival. The mechanism of action of the copper IUD is not known for certain. It can stay in place for 10 years.  Hormone IUD--This type of IUD contains the hormone progestin (synthetic progesterone). The progestin thickens the cervical mucus and prevents sperm from entering the uterus, and it also thins the uterine lining. There is no evidence that the hormone IUD prevents implantation. One hormone IUD can stay in place for up to 5 years, and a different hormone IUD can stay in place for up to 3 years. An IUD is the most cost-effective birth control if left in place for the full duration. It may be removed at any time. LET [facility] CARE PROVIDER KNOW ABOUT:  Any allergies you have.  All medicines you are taking, including vitamins, herbs, eye drops, creams, and over-the-counter medicines.  Previous problems you or members of your family have had with the use of anesthetics.  Any blood disorders you have.  Previous surgeries you have had.  Possibility of pregnancy.  Medical conditions you have. RISKS  AND COMPLICATIONS  Generally, intrauterine device insertion is a safe procedure. However, as with any procedure, complications can occur. Possible complications include:  Accidental puncture (perforation) of the uterus.  Accidental placement of the IUD either in the muscle layer of the uterus (myometrium) or outside the uterus. If this happens, the  IUD can be found essentially floating around the bowels and must be taken out surgically.  The IUD may fall out of the uterus (expulsion). This is more common in women who have recently had a child.   Pregnancy in the fallopian tube (ectopic).  Pelvic inflammatory disease (PID), which is infection of the uterus and fallopian tubes. The risk of PID is slightly increased in the first 20 days after the IUD is placed, but the overall risk is still very low. BEFORE THE PROCEDURE  Schedule the IUD insertion for when you will have your menstrual period or right after, to make sure you are not pregnant. Placement of the IUD is better tolerated shortly after a menstrual cycle.  You may need to take tests or be examined to make sure you are not pregnant.  You may be required to take a pregnancy test.  You may be required to get checked for sexually transmitted infections (STIs) prior to placement. Placing an IUD in someone who has an infection can make the infection worse.  You may be given a pain reliever to take 1 or 2 hours before the procedure.  An exam will be performed to determine the size and position of your uterus.  Ask your health care provider about changing or stopping your regular medicines. PROCEDURE   A tool (speculum) is placed in the vagina. This allows your health care provider to see the lower part of the uterus (cervix).  The cervix is prepped with a medicine that lowers the risk of infection.  You may be given a medicine to numb each side of the cervix (intracervical or paracervical block). This is used to block and control any discomfort with insertion.  A tool (uterine sound) is inserted into the uterus to determine the length of the uterine cavity and the direction the uterus may be tilted.  A slim instrument (IUD inserter) is inserted through the cervical canal and into your uterus.  The IUD is placed in the uterine cavity and the insertion device is removed.  The  nylon string that is attached to the IUD and used for eventual IUD removal is trimmed. It is trimmed so that it lays high in the vagina, just outside the cervix. AFTER THE PROCEDURE  You may have bleeding after the procedure. This is normal. It varies from light spotting for a few days to menstrual-like bleeding.  You may have mild cramping. Document Released: 03/07/2011 Document Revised: 04/29/2013 Document Reviewed: 12/28/2012 [facility] Patient Information 2015 [place], [place]. This information is not intended to replace advice given to you by your health care provider. Make sure you discuss any questions you have with your health care provider.

## 2015-02-04 LAB — LIPID PANEL
CHOLESTEROL: 195 mg/dL (ref 0–200)
HDL: 67 mg/dL (ref >=46)
LDL Cholesterol: 119 mg/dL — ABNORMAL HIGH (ref 0–99)
Total CHOL/HDL Ratio: 2.9 Ratio
Triglycerides: 45 mg/dL (ref <150)
VLDL: 9 mg/dL (ref 0–40)

## 2015-02-04 LAB — HEMOGLOBIN A1C
Hgb A1c MFr Bld: 5.9 % — ABNORMAL HIGH (ref <5.7)
Mean Plasma Glucose: 123 mg/dL — ABNORMAL HIGH (ref <117)

## 2015-02-07 ENCOUNTER — Other Ambulatory Visit: Payer: BLUE CROSS/BLUE SHIELD

## 2015-02-07 LAB — CYTOLOGY - PAP

## 2015-02-14 ENCOUNTER — Telehealth: Payer: Self-pay | Admitting: *Deleted

## 2015-02-14 ENCOUNTER — Encounter: Payer: Self-pay | Admitting: Family Medicine

## 2015-02-14 ENCOUNTER — Ambulatory Visit (INDEPENDENT_AMBULATORY_CARE_PROVIDER_SITE_OTHER): Payer: BLUE CROSS/BLUE SHIELD | Admitting: Family Medicine

## 2015-02-14 VITALS — BP 100/60 | HR 80 | Temp 98.6°F | Ht 68.0 in | Wt 191.5 lb

## 2015-02-14 DIAGNOSIS — R102 Pelvic and perineal pain: Secondary | ICD-10-CM

## 2015-02-14 DIAGNOSIS — E785 Hyperlipidemia, unspecified: Secondary | ICD-10-CM | POA: Diagnosis not present

## 2015-02-14 DIAGNOSIS — M2550 Pain in unspecified joint: Secondary | ICD-10-CM

## 2015-02-14 DIAGNOSIS — E119 Type 2 diabetes mellitus without complications: Secondary | ICD-10-CM | POA: Diagnosis not present

## 2015-02-14 DIAGNOSIS — E1165 Type 2 diabetes mellitus with hyperglycemia: Secondary | ICD-10-CM | POA: Diagnosis not present

## 2015-02-14 LAB — SEDIMENTATION RATE: Sed Rate: 12 mm/hr (ref 0–22)

## 2015-02-14 LAB — URIC ACID: Uric Acid, Serum: 4.2 mg/dL (ref 2.4–7.0)

## 2015-02-14 LAB — HIGH SENSITIVITY CRP: CRP, High Sensitivity: 1.21 mg/L (ref 0.000–5.000)

## 2015-02-14 LAB — RHEUMATOID FACTOR

## 2015-02-14 MED ORDER — DICLOFENAC SODIUM 75 MG PO TBEC: 75.0000 mg | DELAYED_RELEASE_TABLET | Freq: Two times a day (BID) | ORAL | Status: DC

## 2015-02-14 NOTE — Progress Notes (Signed)
Dr. Frederico Hamman T. Isella Slatten, MD, [facility] [place], [address] Phone: [phone] Fax: [phone]  02/14/2015  Patient: Priscilla King, MRN: [id number], DOB: [birth year], 39 y.o.  Primary Physician:  Owens Loffler, MD  Chief Complaint: Diabetes  Subjective:   Priscilla King is a 39 y.o. very pleasant female patient who presents with the following:  Wt Readings from Last 3 Encounters:  02/14/15 191 lb 8 oz (86.864 kg)  02/03/15 185 lb (83.915 kg)  10/25/14 196 lb 4 oz (89.018 kg)    Entire body feels like it is puffed up and inflammed and hurting in pretty much all of her joints.  Upper and lower body on the R is hurting a lot.  Taking 4 Advil every   Diabetes Mellitus: Tolerating Medications: d/c with trial of exercise and diet Compliance with diet: fair Exercise: routine, some joint pain limitations Avg blood sugars at home: not checking Foot problems: none Hypoglycemia: none No nausea, vomitting, blurred vision, polyuria.  Lab Results  Component Value Date   HGBA1C 5.9* 02/03/2015   HGBA1C 5.7 10/18/2014   HGBA1C 7.4* 07/01/2014   Lab Results  Component Value Date   MICROALBUR 2.6* 10/25/2014   LDLCALC 119* 02/03/2015   CREATININE 0.6 06/30/2014    Wt Readings from Last 3 Encounters:  02/14/15 191 lb 8 oz (86.864 kg)  02/03/15 185 lb (83.915 kg)  10/25/14 196 lb 4 oz (89.018 kg)    Body mass index is 29.12 kg/(m^2).   Lipids: Doing well, stable. Trial of d/c meds Panel reviewed with patient.  Lipids:    Component Value Date/Time   CHOL 195 02/03/2015 0831   TRIG 45 02/03/2015 0831   HDL 67 02/03/2015 0831   VLDL 9 02/03/2015 0831   CHOLHDL 2.9 02/03/2015 0831    Lab Results  Component Value Date   ALT 34 06/30/2014   AST 22 06/30/2014   ALKPHOS 52 06/30/2014   BILITOT 0.5 06/30/2014    Polyarthralgia Father with chronic joint pains  1999 saw Rheum She has had intermittent  polyarthralgia, multiple joints, and this is worsened over the last few months.  She has long-standing right-sided wrist pain, status post kienbocks with some continuing effusion and loss of motion in the right wrist.  Past Medical History, Surgical History, Social History, Family History, Problem List, Medications, and Allergies have been reviewed and updated if relevant.  Patient Active Problem List   Diagnosis Date Noted  . Obesity (BMI 30.0-34.9) 07/19/2014  . Diabetes mellitus type 2, uncontrolled, without complications   . Hyperlipidemia LDL goal <70   . Kienbock's avascular necrosis of lunate, adult   . PCOS (polycystic ovarian syndrome) 04/15/2012  . OTHER SPECIFIED FORMS OF HEARING LOSS 03/10/2009  . PATELLO-FEMORAL SYNDROME 04/16/2008    Past Medical History  Diagnosis Date  . Seronegative arthritis     possible, [place], [place]  . History of polycystic ovaries   . Gestational diabetes     metformin  . Obesity   . Diabetes mellitus type 2, uncontrolled, without complications 34/7425  . Hyperlipidemia LDL goal <70 06/2014  . Kienbock's avascular necrosis of lunate, adult     Past Surgical History  Procedure Laterality Date  . Cesarean section N/A 08/31/2012    Procedure: CESAREAN SECTION;  Surgeon: Mora Bellman, MD;  Location: [facility];  Service: Obstetrics;  Laterality: N/A;    History   Social History  . Marital Status: Married  Spouse Name: N/A  . Number of Children: 1  . Years of Education: N/A   Occupational History  . 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    [employer]     Social History Main Topics  . Smoking status: Never Smoker   . Smokeless tobacco: Never Used  . Alcohol Use: No  . Drug Use: No  . Sexual Activity: Yes   Other Topics Concern  . 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 Stroke Mother     CVA  (TIA)  . Diabetes Mother   . Diabetes Maternal Grandmother   . Hypertension Maternal Grandmother   . Diabetes Paternal Grandmother   . Hypertension Paternal Grandmother     Allergies  Allergen Reactions  . Vicodin [Hydrocodone-Acetaminophen] Swelling    Tongue swelling and headache    Medication list reviewed and updated in full in [place].   GEN: No acute illnesses, no fevers, chills. GI: No n/v/d, eating normally Pulm: No SOB Interactive and getting along well at home.  Otherwise, ROS is as per the HPI.  Objective:   BP 100/60 mmHg  Pulse 80  Temp(Src) 98.6 F (37 C) (Oral)  Ht 5\' 8"  (1.727 m)  Wt 191 lb 8 oz (86.864 kg)  BMI 29.12 kg/m2  LMP 01/13/2015  GEN: WDWN, NAD, Non-toxic, A & O x 3 HEENT: Atraumatic, Normocephalic. Neck supple. No masses, No LAD. Ears and Nose: No external deformity. CV: RRR, No M/G/R. No JVD. No thrill. No extra heart sounds. PULM: CTA B, no wheezes, crackles, rhonchi. No retractions. No resp. distress. No accessory muscle use. EXTR: No c/c/e NEURO Normal gait.  PSYCH: Normally interactive. Conversant. Not depressed or anxious appearing.  Calm demeanor.   No synovitis or tenosynovitis in the hands.  There is a mild effusion in the right wrist with dramatic loss of motion in the right wrist as well.  Full range of motion in the elbows shoulders.  Minimal effusion in the knees.  Laboratory and Imaging Data: Results for orders placed or performed in visit on 02/14/15  Sedimentation rate  Result Value Ref Range   Sed Rate 12 0 - 22 mm/hr  High sensitivity CRP  Result Value Ref Range   CRP, High Sensitivity 1.210 0.000 - 0.938 mg/L  Cyclic citrul peptide antibody, IgG  Result Value Ref Range   Cyclic Citrullin Peptide Ab  0.0 - 5.0 U/mL  Rheumatoid factor  Result Value Ref Range   Rhuematoid fact SerPl-aCnc <10 <=14 IU/mL  ANA  Result Value Ref Range   Anit Nuclear Antibody(ANA)  NEGATIVE  Uric acid  Result Value Ref  Range   Uric Acid, Serum 4.2 2.4 - 7.0 mg/dL     Assessment and Plan:   Diabetes mellitus type 2, controlled, without complications  Polyarthralgia - Plan: Sedimentation rate, High sensitivity CRP, Cyclic citrul peptide antibody, IgG, Rheumatoid factor, ANA, Uric acid  Hyperlipidemia LDL goal <70  Diabetes and cholesterol is doing well on diet control.  Recheck in 6 months.  Polyarthralgia, question rheumatological condition.  At this point, most labs have returned with the exception of CCP AB and ANA.  With a normal sedimentation rate and CRP, this is more likely combination of osteoarthritic change combined with prior [facility]  New Prescriptions  DICLOFENAC (VOLTAREN) 75 MG EC TABLET    Take 1 tablet (75 mg total) by mouth 2 (two) times daily.   Orders Placed This Encounter  Procedures  . Sedimentation rate  . High sensitivity CRP  . Cyclic citrul peptide antibody, IgG  . Rheumatoid factor  . ANA  . Uric acid    Signed,  Corda Shutt T. Shania Bjelland, MD   Patient's Medications  New Prescriptions   DICLOFENAC (VOLTAREN) 75 MG EC TABLET    Take 1 tablet (75 mg total) by mouth 2 (two) times daily.  Previous Medications   NORETHINDRONE (MICRONOR,CAMILA,ERRIN) 0.35 MG TABLET    Take 1 tablet (0.35 mg total) by mouth daily.  Modified Medications   No medications on file  Discontinued Medications   No medications on file

## 2015-02-14 NOTE — Telephone Encounter (Signed)
Added TVUS to current Pelvic comp order per imaging req

## 2015-02-14 NOTE — Progress Notes (Signed)
Pre visit review using our clinic review tool, if applicable. No additional management support is needed unless otherwise documented below in the visit note. 

## 2015-02-15 LAB — ANA: Anti Nuclear Antibody(ANA): NEGATIVE

## 2015-02-15 LAB — CYCLIC CITRUL PEPTIDE ANTIBODY, IGG: Cyclic Citrullin Peptide Ab: <2 U/mL (ref 0.0–5.0)

## 2015-02-17 ENCOUNTER — Ambulatory Visit: Payer: BLUE CROSS/BLUE SHIELD

## 2015-02-18 ENCOUNTER — Encounter: Payer: Self-pay | Admitting: *Deleted

## 2015-02-23 ENCOUNTER — Ambulatory Visit (INDEPENDENT_AMBULATORY_CARE_PROVIDER_SITE_OTHER): Payer: BLUE CROSS/BLUE SHIELD | Admitting: Obstetrics & Gynecology

## 2015-02-23 ENCOUNTER — Encounter: Payer: Self-pay | Admitting: Obstetrics & Gynecology

## 2015-02-23 VITALS — BP 127/87 | HR 82 | Resp 18 | Ht 68.0 in | Wt 195.0 lb

## 2015-02-23 DIAGNOSIS — Z30430 Encounter for insertion of intrauterine contraceptive device: Secondary | ICD-10-CM | POA: Diagnosis not present

## 2015-02-23 DIAGNOSIS — E282 Polycystic ovarian syndrome: Secondary | ICD-10-CM

## 2015-02-23 DIAGNOSIS — Z01812 Encounter for preprocedural laboratory examination: Secondary | ICD-10-CM | POA: Diagnosis not present

## 2015-02-23 LAB — POCT URINE PREGNANCY: Preg Test, Ur: NEGATIVE

## 2015-02-23 MED ORDER — LEVONORGESTREL 20 MCG/24HR IU IUD
INTRAUTERINE_SYSTEM | Freq: Once | INTRAUTERINE | Status: AC
  Administered 2015-02-23: 09:00:00 via INTRAUTERINE

## 2015-02-23 NOTE — Patient Instructions (Signed)

## 2015-02-23 NOTE — Progress Notes (Signed)
    GYNECOLOGY CLINIC PROCEDURE NOTE  Priscilla King is a 39 y.o. G2P1011 here for Mirena IUD insertion for PCOS and contraception. No GYN concerns; on last days of menstrual period. Normal pap smear and negative high-risk HPV on 02/03/2015.   IUD Insertion Procedure Note Patient identified, informed consent performed, consent signed.   Discussed risks of irregular bleeding, cramping, infection, malpositioning or misplacement of the IUD outside the uterus which may require further procedure such as laparoscopy. Time out was performed.  Urine pregnancy test negative.  Speculum placed in the vagina.  Cervix visualized.  Cleaned with Betadine x 2.  Grasped anteriorly with a single tooth tenaculum.  Uterus sounded to 8 cm.  Mirena IUD placed per manufacturer's recommendations after some dilation was done to accommodate the Mirena insertion apparatus.  Strings trimmed to 3 cm. Tenaculum was removed, good hemostasis noted.  Patient tolerated procedure well.   Patient was given post-procedure instructions.  She was advised to have backup contraception for one week.  Patient was also asked to check IUD strings periodically and follow up in 4 weeks for IUD check.    Verita Schneiders, MD, [place] Attending [place] for [employer], [place]

## 2015-03-30 ENCOUNTER — Encounter: Payer: Self-pay | Admitting: Obstetrics & Gynecology

## 2015-03-30 ENCOUNTER — Ambulatory Visit (INDEPENDENT_AMBULATORY_CARE_PROVIDER_SITE_OTHER): Payer: BLUE CROSS/BLUE SHIELD | Admitting: Obstetrics & Gynecology

## 2015-03-30 VITALS — BP 118/79 | HR 79 | Ht 68.0 in | Wt 195.0 lb

## 2015-03-30 DIAGNOSIS — E282 Polycystic ovarian syndrome: Secondary | ICD-10-CM

## 2015-03-30 DIAGNOSIS — R102 Pelvic and perineal pain: Secondary | ICD-10-CM | POA: Diagnosis not present

## 2015-03-30 DIAGNOSIS — Z30431 Encounter for routine checking of intrauterine contraceptive device: Secondary | ICD-10-CM

## 2015-03-30 DIAGNOSIS — N926 Irregular menstruation, unspecified: Secondary | ICD-10-CM

## 2015-03-30 NOTE — Progress Notes (Signed)
    GYNECOLOGY CLINIC PROGRESS NOTE  History:  39 y.o. G2P1011 here today for today for IUD string check; Mirena IUD was placed 02/23/15. Reports irregular, prolonged menstrual period and cramping (R >L) alleviated by NSAIDs. No problems during intercourse.  The following portions of the patient's history were reviewed and updated as appropriate: allergies, current medications, past family history, past medical history, past social history, past surgical history and problem list. Normal pap smear and negative high-risk HPV on 02/03/2015.   Review of Systems:  Pertinent items are noted in HPI.  Objective:  Physical Exam Blood pressure 118/79, pulse 79, height 5\' 8"  (1.727 m), weight 195 lb (88.451 kg). CONSTITUTIONAL: Well-developed, well-nourished female in no acute distress.  HENT:  Normocephalic, atraumatic. External right and left ear normal. Oropharynx is clear and moist EYES: Conjunctivae and EOM are normal. Pupils are equal, round, and reactive to light. No scleral icterus.  NECK: Normal range of motion, supple, no masses CARDIOVASCULAR: Normal heart rate noted RESPIRATORY: Effort and breath sounds normal, no problems with respiration noted ABDOMEN: Soft, no distention noted.   PELVIC: Normal appearing external genitalia; normal appearing vaginal mucosa and cervix.  IUD strings visualized, about 1 cm in length outside cervix.   Assessment & Plan:  Normal IUD check. Reassured about expected side effects; was given a pack of Minastrin 24 Fe to help with prolonged bleeding. Will call back in RLQ pain worsens; she attributes this to her PCOS. If worsens, may need ultrasound to rule out large ovarian cyst or other etiology Patient to keep IUD in place for five years; can come in for removal if she desires pregnancy within the next five years. Routine preventative health maintenance measures emphasized.    Verita Schneiders, MD, [place] Attending Obstetrician & Gynecologist, [place] for [facility]

## 2015-03-30 NOTE — Patient Instructions (Addendum)
Return to clinic for any scheduled appointments or for any gynecologic concerns as needed.   

## 2015-10-24 DIAGNOSIS — L700 Acne vulgaris: Secondary | ICD-10-CM | POA: Diagnosis not present

## 2016-04-03 DIAGNOSIS — L700 Acne vulgaris: Secondary | ICD-10-CM | POA: Diagnosis not present

## 2016-04-03 DIAGNOSIS — L219 Seborrheic dermatitis, unspecified: Secondary | ICD-10-CM | POA: Diagnosis not present

## 2016-05-22 ENCOUNTER — Ambulatory Visit (INDEPENDENT_AMBULATORY_CARE_PROVIDER_SITE_OTHER): Payer: BLUE CROSS/BLUE SHIELD | Admitting: Obstetrics & Gynecology

## 2016-05-22 ENCOUNTER — Encounter: Payer: Self-pay | Admitting: Obstetrics & Gynecology

## 2016-05-22 VITALS — BP 122/81 | HR 93 | Resp 18 | Ht 68.0 in | Wt 215.0 lb

## 2016-05-22 DIAGNOSIS — Z30432 Encounter for removal of intrauterine contraceptive device: Secondary | ICD-10-CM

## 2016-05-22 NOTE — Progress Notes (Signed)
    GYNECOLOGY CLINIC PROCEDURE NOTE  Priscilla King is a 40 y.o. G2P1011 here for Mirena IUD removal.  Was on IUD to help with PCOS, but now desires removal. Wants to be hormone free for a while.  No GYN concerns.  Last pap smear was on 02/03/15 and was normal with negative HRHPV.  IUD Removal  Patient identified, informed consent performed, consent signed.  Patient was in the dorsal lithotomy position, normal external genitalia was noted.  A speculum was placed in the patient's vagina, normal discharge was noted, no lesions. The cervix was visualized, no lesions, no abnormal discharge.  The strings of the IUD were grasped and pulled using ring forceps. The IUD was removed in its entirety.  Patient tolerated the procedure well.    Patient declines any further contraception for now; no plans to get pregnant either. Routine preventative health maintenance measures emphasized.   Priscilla Schneiders, MD, [place] Attending [place], [facility] for [employer], [place]

## 2016-05-24 ENCOUNTER — Encounter: Payer: Self-pay | Admitting: *Deleted

## 2016-07-02 ENCOUNTER — Other Ambulatory Visit: Payer: BLUE CROSS/BLUE SHIELD | Admitting: *Deleted

## 2016-07-02 DIAGNOSIS — R300 Dysuria: Secondary | ICD-10-CM

## 2016-07-02 MED ORDER — PHENAZOPYRIDINE HCL 200 MG PO TABS
200.0000 mg | ORAL_TABLET | Freq: Three times a day (TID) | ORAL | 0 refills | Status: DC | PRN
Start: 2016-07-02 — End: 2016-08-01

## 2016-07-02 MED ORDER — SULFAMETHOXAZOLE-TRIMETHOPRIM 800-160 MG PO TABS: 1.0000 | ORAL_TABLET | Freq: Two times a day (BID) | ORAL | 0 refills | Status: DC

## 2016-07-02 NOTE — Progress Notes (Signed)
Pt is here today c/o pain with urination, took OTC AZO tablets this morning so unable to run an urinalysis dipstick, will send urine culture to lab.  Pt will be going out of the country on Wednesday, sent Bactrim and Pyridium to the pharmacy and instructed on medication use and to push fluids and empty bladder often.  Pt acknowledged instructions.

## 2016-07-04 LAB — URINE CULTURE

## 2016-07-23 NOTE — L&D Delivery Note (Signed)
Delivery Summary for [name]  Labor Events:   Preterm labor:   Rupture date:   Rupture time:   Rupture type:   Fluid Color:   Induction:   Augmentation:   Complications:   Cervical ripening:          Delivery:   Episiotomy:   Lacerations:   Repair suture:   Repair # of packets:   Blood loss (ml): 600   Information for the patient's newborn:  Scharlene, Catalina [[id number]]    Delivery 03/20/2017 3:00 PM by  C-Section, Vacuum Assisted Sex:  female Gestational Age: [redacted]w[redacted]d Delivery Clinician:   Living?:         APGARS  One minute Five minutes Ten minutes  Skin color:        Heart rate:        Grimace:        Muscle tone:        Breathing:        Totals: 8  9      Presentation/position:      Resuscitation:   Cord information:    Disposition of cord blood:     Blood gases sent?  Complications:   Placenta: Delivered:       appearance Newborn Measurements: Weight: 10 lb 9 oz (4790 g)  Height: 21.65"  Head circumference:    Chest circumference:    Other providers:    Additional  information: Forceps:   Vacuum:   Breech:   Observed anomalies        See Dr. Andreas Blower operative note for details of C-section procedure.    Rubie Maid, MD [facility]

## 2016-08-01 ENCOUNTER — Encounter: Payer: Self-pay | Admitting: Family Medicine

## 2016-08-01 ENCOUNTER — Ambulatory Visit (INDEPENDENT_AMBULATORY_CARE_PROVIDER_SITE_OTHER): Payer: BLUE CROSS/BLUE SHIELD | Admitting: Family Medicine

## 2016-08-01 VITALS — BP 112/70 | HR 82 | Temp 98.7°F | Ht 68.0 in | Wt 219.0 lb

## 2016-08-01 DIAGNOSIS — Z3201 Encounter for pregnancy test, result positive: Secondary | ICD-10-CM | POA: Diagnosis not present

## 2016-08-01 DIAGNOSIS — N912 Amenorrhea, unspecified: Secondary | ICD-10-CM

## 2016-08-01 DIAGNOSIS — E119 Type 2 diabetes mellitus without complications: Secondary | ICD-10-CM | POA: Diagnosis not present

## 2016-08-01 DIAGNOSIS — E282 Polycystic ovarian syndrome: Secondary | ICD-10-CM | POA: Diagnosis not present

## 2016-08-01 DIAGNOSIS — O09521 Supervision of elderly multigravida, first trimester: Secondary | ICD-10-CM

## 2016-08-01 LAB — CBC WITH DIFFERENTIAL/PLATELET
BASOS ABS: 0 10*3/uL (ref 0.0–0.1)
Basophils Relative: 0.3 % (ref 0.0–3.0)
Eosinophils Absolute: 0.1 10*3/uL (ref 0.0–0.7)
Eosinophils Relative: 0.7 % (ref 0.0–5.0)
HCT: 38.1 % (ref 36.0–46.0)
Hemoglobin: 12.9 g/dL (ref 12.0–15.0)
LYMPHS ABS: 2.9 10*3/uL (ref 0.7–4.0)
Lymphocytes Relative: 28.1 % (ref 12.0–46.0)
MCHC: 33.9 g/dL (ref 30.0–36.0)
MCV: 87.2 fl (ref 78.0–100.0)
MONO ABS: 0.5 10*3/uL (ref 0.1–1.0)
MONOS PCT: 4.5 % (ref 3.0–12.0)
NEUTROS ABS: 6.7 10*3/uL (ref 1.4–7.7)
NEUTROS PCT: 66.4 % (ref 43.0–77.0)
PLATELETS: 224 10*3/uL (ref 150.0–400.0)
RBC: 4.36 Mil/uL (ref 3.87–5.11)
RDW: 13.7 % (ref 11.5–15.5)
WBC: 10.1 10*3/uL (ref 4.0–10.5)

## 2016-08-01 LAB — HEPATIC FUNCTION PANEL
ALBUMIN: 4.5 g/dL (ref 3.5–5.2)
ALK PHOS: 57 U/L (ref 39–117)
ALT: 21 U/L (ref 0–35)
AST: 19 U/L (ref 0–37)
BILIRUBIN TOTAL: 0.5 mg/dL (ref 0.2–1.2)
Bilirubin, Direct: 0 mg/dL (ref 0.0–0.3)
Total Protein: 8.1 g/dL (ref 6.0–8.3)

## 2016-08-01 LAB — HEMOGLOBIN A1C: HEMOGLOBIN A1C: 6 % (ref 4.6–6.5)

## 2016-08-01 LAB — BASIC METABOLIC PANEL
BUN: 8 mg/dL (ref 6–23)
CALCIUM: 9.7 mg/dL (ref 8.4–10.5)
CO2: 21 meq/L (ref 19–32)
CREATININE: 0.71 mg/dL (ref 0.40–1.20)
Chloride: 105 mEq/L (ref 96–112)
GFR: 116.98 mL/min (ref >60.00)
GLUCOSE: 85 mg/dL (ref 70–99)
Potassium: 3.7 mEq/L (ref 3.5–5.1)
Sodium: 136 mEq/L (ref 135–145)

## 2016-08-01 LAB — POCT URINE PREGNANCY: Preg Test, Ur: POSITIVE — AB

## 2016-08-01 NOTE — Progress Notes (Signed)
Pre visit review using our clinic review tool, if applicable. No additional management support is needed unless otherwise documented below in the visit note. 

## 2016-08-01 NOTE — Progress Notes (Signed)
As   Dr. Frederico Hamman T. Branton Einstein, MD, [facility] [place], [address] Phone: [phone] Fax: [phone]  08/01/2016  Patient: Priscilla King, MRN: [id number], DOB: April 06, 1976, 41 y.o.  Primary Physician:  Owens Loffler, MD   Chief Complaint  Patient presents with  . Diabetes    found out she is pregnant last week   Subjective:   Priscilla King is a 41 y.o. very pleasant female patient who presents with the following:  Pregnancy. Prenatal vitamins. Has taken multiple pregnancy tests.   On her last pregnancy - took Metformin for PCOS.   Dr. Penny Pia Nov. 30 LMP.  The patient is now [redacted] weeks pregnant.  She presents today, because she called her OB/GYN questioning about new patient evaluation since she is pregnant, age 41, and diabetic, and she also has PCOS. They referred her to me.  The set her up an appointment at about 11 or 12 weeks.  She is currently asymptomatic.  Past Medical History, Surgical History, Social History, Family History, Problem List, Medications, and Allergies have been reviewed and updated if relevant.  Patient Active Problem List   Diagnosis Date Noted  . Diabetes mellitus type 2, controlled, without complications ([place])   . Hyperlipidemia LDL goal <70   . Kienbock's avascular necrosis of lunate, adult   . PCOS (polycystic ovarian syndrome) 04/15/2012  . OTHER SPECIFIED FORMS OF HEARING LOSS 03/10/2009  . PATELLO-FEMORAL SYNDROME 04/16/2008    Past Medical History:  Diagnosis Date  . Diabetes mellitus type 2, uncontrolled, without complications ([place]) 123XX123  . Gestational diabetes    metformin  . History of polycystic ovaries   . Hyperlipidemia LDL goal <70 06/2014  . Kienbock's avascular necrosis of lunate, adult   . Obesity   . Seronegative arthritis    possible, [place], [place]    Past Surgical History:  Procedure Laterality Date  . CESAREAN SECTION N/A 08/31/2012   Procedure:  CESAREAN SECTION;  Surgeon: Mora Bellman, MD;  Location: [facility];  Service: Obstetrics;  Laterality: N/A;    Social History   Social History  . Marital status: Married    Spouse name: N/A  . Number of children: 1  . Years of education: N/A   Occupational History  . 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  [employer]  .  [employer]   Social History Main Topics  . Smoking status: Never Smoker  . Smokeless tobacco: Never Used  . Alcohol use No  . Drug use: No  . Sexual activity: Yes    Birth control/ protection: IUD   Other Topics Concern  . 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 Stroke Mother     CVA (TIA)  . Diabetes Mother   . Diabetes Maternal Grandmother   . Hypertension Maternal Grandmother   . Diabetes Paternal Grandmother   . Hypertension Paternal Grandmother     Allergies  Allergen Reactions  . Vicodin [Hydrocodone-Acetaminophen] Swelling    Tongue swelling and headache    Medication list reviewed and updated in full in [place].   GEN: No acute illnesses, no fevers, chills. GI: No n/v/d, eating normally Pulm: No SOB Interactive and getting along well at home.  Otherwise, ROS is as per the  HPI.  Objective:   BP 112/70   Pulse 82   Temp 98.7 F (37.1 C) (Oral)   Ht 5\' 8"  (1.727 m)   Wt 219 lb (99.3 kg)   LMP 06/11/2016   BMI 33.30 kg/m   GEN: WDWN, NAD, Non-toxic, A & O x 3 HEENT: Atraumatic, Normocephalic. Neck supple. No masses, No LAD. Ears and Nose: No external deformity. CV: RRR, No M/G/R. No JVD. No thrill. No extra heart sounds. PULM: CTA B, no wheezes, crackles, rhonchi. No retractions. No resp. distress. No accessory muscle use. EXTR: No c/c/e NEURO Normal gait.  PSYCH: Normally interactive. Conversant. Not depressed or anxious appearing.  Calm demeanor.   Laboratory and Imaging  Data: Results for orders placed or performed in visit on 99991111  Basic metabolic panel  Result Value Ref Range   Sodium 136 135 - 145 mEq/L   Potassium 3.7 3.5 - 5.1 mEq/L   Chloride 105 96 - 112 mEq/L   CO2 21 19 - 32 mEq/L   Glucose, Bld 85 70 - 99 mg/dL   BUN 8 6 - 23 mg/dL   Creatinine, Ser 0.71 0.40 - 1.20 mg/dL   Calcium 9.7 8.4 - 10.5 mg/dL   GFR 116.98 >60.00 mL/min  CBC with Differential/Platelet  Result Value Ref Range   WBC 10.1 4.0 - 10.5 K/uL   RBC 4.36 3.87 - 5.11 Mil/uL   Hemoglobin 12.9 12.0 - 15.0 g/dL   HCT 38.1 36.0 - 46.0 %   MCV 87.2 78.0 - 100.0 fl   MCHC 33.9 30.0 - 36.0 g/dL   RDW 13.7 11.5 - 15.5 %   Platelets 224.0 150.0 - 400.0 K/uL   Neutrophils Relative % 66.4 43.0 - 77.0 %   Lymphocytes Relative 28.1 12.0 - 46.0 %   Monocytes Relative 4.5 3.0 - 12.0 %   Eosinophils Relative 0.7 0.0 - 5.0 %   Basophils Relative 0.3 0.0 - 3.0 %   Neutro Abs 6.7 1.4 - 7.7 K/uL   Lymphs Abs 2.9 0.7 - 4.0 K/uL   Monocytes Absolute 0.5 0.1 - 1.0 K/uL   Eosinophils Absolute 0.1 0.0 - 0.7 K/uL   Basophils Absolute 0.0 0.0 - 0.1 K/uL  Hepatic function panel  Result Value Ref Range   Total Bilirubin 0.5 0.2 - 1.2 mg/dL   Bilirubin, Direct 0.0 0.0 - 0.3 mg/dL   Alkaline Phosphatase 57 39 - 117 U/L   AST 19 0 - 37 U/L   ALT 21 0 - 35 U/L   Total Protein 8.1 6.0 - 8.3 g/dL   Albumin 4.5 3.5 - 5.2 g/dL  Hemoglobin A1c  Result Value Ref Range   Hgb A1c MFr Bld 6.0 4.6 - 6.5 %  POCT urine pregnancy  Result Value Ref Range   Preg Test, Ur Positive (A) Negative     Assessment and Plan:   AMA (advanced maternal age) multigravida 35+, first trimester - Plan: Basic metabolic panel, CBC with Differential/Platelet, Hepatic function panel, Hemoglobin A1c  Positive pregnancy test - Plan: Basic metabolic panel, CBC with Differential/Platelet, Hepatic function panel, Hemoglobin A1c  Controlled type 2 diabetes mellitus without complication, without long-term current use of  insulin (H[facility]) - Plan: Basic metabolic panel, CBC with Differential/Platelet, Hepatic function panel, Hemoglobin A1c  PCOS (polycystic ovarian syndrome)  Amenorrhea - Plan: POCT urine pregnancy  The patient is pregnant, she has advanced maternal age at age 41, she has well-controlled type 2 diabetes with a history of gestational diabetes in her prior pregnancy, and  she has PCO S.  At the time of this dictation, she had some bleeding the following morning after her office visit and went to see OB.  They did do an ultrasound.  At this point after talking about this with the patient and my own experience, I made sure she understood that management of relatively high risk OB patient's should be done by Obstetrics and it is outside the scope of my practice. Given her bleeding and risk, I think that she needs close follow-up.  The patient would like to have a different OB practice manage her care. We will arrange this.  For now, BS is very stable. I asked her to check BID BS with her accucheck machine.  Follow-up: with OB  Medications Discontinued During This Encounter  Medication Reason  . sulfamethoxazole-trimethoprim (BACTRIM DS,SEPTRA DS) 800-160 MG tablet Completed Course  . phenazopyridine (PYRIDIUM) 200 MG tablet Completed Course   Orders Placed This Encounter  Procedures  . Basic metabolic panel  . CBC with Differential/Platelet  . Hepatic function panel  . Hemoglobin A1c  . POCT urine pregnancy    Signed,  Chrissa Meetze T. Ricky Gallery, MD   Allergies as of 08/01/2016      Reactions   Vicodin [hydrocodone-acetaminophen] Swelling   Tongue swelling and headache      Medication List    as of 08/01/2016 11:59 PM   You have not been prescribed any medications.

## 2016-08-02 ENCOUNTER — Other Ambulatory Visit: Payer: Self-pay | Admitting: Family Medicine

## 2016-08-02 ENCOUNTER — Ambulatory Visit (INDEPENDENT_AMBULATORY_CARE_PROVIDER_SITE_OTHER): Payer: BLUE CROSS/BLUE SHIELD | Admitting: *Deleted

## 2016-08-02 ENCOUNTER — Telehealth: Payer: Self-pay

## 2016-08-02 ENCOUNTER — Encounter: Payer: Self-pay | Admitting: *Deleted

## 2016-08-02 DIAGNOSIS — E119 Type 2 diabetes mellitus without complications: Secondary | ICD-10-CM

## 2016-08-02 DIAGNOSIS — O209 Hemorrhage in early pregnancy, unspecified: Secondary | ICD-10-CM

## 2016-08-02 DIAGNOSIS — O09521 Supervision of elderly multigravida, first trimester: Secondary | ICD-10-CM

## 2016-08-02 DIAGNOSIS — Z3689 Encounter for other specified antenatal screening: Secondary | ICD-10-CM

## 2016-08-02 DIAGNOSIS — E282 Polycystic ovarian syndrome: Secondary | ICD-10-CM

## 2016-08-02 DIAGNOSIS — Z3201 Encounter for pregnancy test, result positive: Secondary | ICD-10-CM

## 2016-08-02 NOTE — Telephone Encounter (Signed)
Pt left v/m; pt has changed her mind and request referral to new OB group in [place] area. Pt last seen 08/01/16. Pt request cb.

## 2016-08-02 NOTE — Progress Notes (Signed)
I agree with RN's assessment and plan as outlined in her note.  Bleeding precautions reviewed with patient.  Osborne Oman, MD

## 2016-08-02 NOTE — Telephone Encounter (Signed)
done

## 2016-08-02 NOTE — Progress Notes (Signed)
Pt here today early pregnancy c/o spotting that started a few days ago and was brown and has now turned bright red.  Bedside transvaginal US performed.  Gestational sac and fetal pole seen measuring [redacted]w[redacted]d, no fetal pole seen at this time.  Informed pt of result and will return in 2 weeks for repeat US and New OB appt to verify viability. No blood noted on TV probe when removed. Discussed with Dr Harolyn Rutherford, informed pt to call us if she experienced any heavy vaginal bleeding like a menstrual cycle.  Pt acknowledged instructions.

## 2016-08-03 ENCOUNTER — Telehealth: Payer: Self-pay

## 2016-08-03 MED ORDER — GLUCOSE BLOOD VI STRP: ORAL_STRIP | 5 refills | Status: DC

## 2016-08-03 MED ORDER — ACCU-CHEK SOFTCLIX LANCETS MISC: 5 refills | Status: DC

## 2016-08-03 NOTE — Telephone Encounter (Signed)
Pt left v/m that she had spoken with Dr Lorelei Pont and he had Oked refill of diabetic test strips and lancets but pt did not know name of strips or lancets. Pt left v/m this morning accu chek compact plus test strips. Could not find in chart how often pt cks BS and [facility] never filled. Left v/m requesting cb.

## 2016-08-03 NOTE — Telephone Encounter (Signed)
Pt called back and cks BS bid; and pt also uses softclix lancets. Refill done as requested and pt will ck with pharmacy. FYI to Dr Lorelei Pont,

## 2016-08-07 ENCOUNTER — Encounter: Payer: Self-pay | Admitting: Obstetrics and Gynecology

## 2016-08-08 ENCOUNTER — Encounter: Payer: Self-pay | Admitting: Obstetrics and Gynecology

## 2016-08-10 ENCOUNTER — Ambulatory Visit (INDEPENDENT_AMBULATORY_CARE_PROVIDER_SITE_OTHER): Payer: BLUE CROSS/BLUE SHIELD | Admitting: Obstetrics and Gynecology

## 2016-08-10 ENCOUNTER — Encounter: Payer: Self-pay | Admitting: Obstetrics and Gynecology

## 2016-08-10 VITALS — BP 116/74 | HR 80 | Ht 68.0 in | Wt 222.6 lb

## 2016-08-10 DIAGNOSIS — E119 Type 2 diabetes mellitus without complications: Secondary | ICD-10-CM | POA: Diagnosis not present

## 2016-08-10 DIAGNOSIS — N926 Irregular menstruation, unspecified: Secondary | ICD-10-CM

## 2016-08-10 DIAGNOSIS — O09521 Supervision of elderly multigravida, first trimester: Secondary | ICD-10-CM | POA: Diagnosis not present

## 2016-08-10 DIAGNOSIS — O209 Hemorrhage in early pregnancy, unspecified: Secondary | ICD-10-CM

## 2016-08-10 LAB — POCT URINALYSIS DIPSTICK
BILIRUBIN UA: NEGATIVE
Glucose, UA: NEGATIVE
KETONES UA: NEGATIVE
Leukocytes, UA: NEGATIVE
NITRITE UA: NEGATIVE
PH UA: 5
PROTEIN UA: NEGATIVE
RBC UA: NEGATIVE
Spec Grav, UA: >=1.03
Urobilinogen, UA: NEGATIVE

## 2016-08-10 NOTE — Progress Notes (Signed)
HPI:      [name] is a 41 y.o. G3P1011 who LMP was Patient's last menstrual period was 06/11/2016..  Subjective: She presents today at approximate 7 weeks estimated gestational age. She complains of daily spotting for the last 2 weeks. Reports no heavy bleeding reports no cramping. Her history is significant for advanced maternal age, history of diabetes which she says is diet controlled alone. She also describes a history of polycystic ovaries and difficulty conceiving in the past. She reports her blood type is positive.     Hx: The following portions of the patient's history were reviewed and updated as appropriate:           She  has a past medical history of Diabetes mellitus type 2, uncontrolled, without complications ([place]) (123XX123); Gestational diabetes; History of polycystic ovaries; Hyperlipidemia LDL goal <70 (06/2014); Kienbock's avascular necrosis of lunate, adult; Obesity; and Seronegative arthritis. She  does not have any pertinent problems on file.        ROS: Constitutional: Denied constitutional symptoms, night sweats, recent illness, fatigue, fever, insomnia and weight loss.  Eyes: Denied eye symptoms, eye pain, photophobia, vision change and visual disturbance.  Ears/Nose/Throat/Neck: Denied ear, nose, throat or neck symptoms, hearing loss, nasal discharge, sinus congestion and sore throat.  Cardiovascular: Denied cardiovascular symptoms, arrhythmia, chest pain/pressure, edema, exercise intolerance, orthopnea and palpitations.  Respiratory: Denied pulmonary symptoms, asthma, pleuritic pain, productive sputum, cough, dyspnea and wheezing.  Gastrointestinal: Denied, gastro-esophageal reflux, melena, nausea and vomiting.  Genitourinary: See HPI for additional information.  Musculoskeletal: Denied musculoskeletal symptoms, stiffness, swelling, muscle weakness and myalgia.  Dermatologic: Denied dermatology symptoms, rash and scar.  Neurologic: Denied neurology  symptoms, dizziness, headache, neck pain and syncope.  Psychiatric: Denied psychiatric symptoms, anxiety and depression.  Endocrine: Denied endocrine symptoms including hot flashes and night sweats.     Objective: There were no vitals filed for this visit.           Physical examination   Pelvic:   Vulva: Normal appearance.  No lesions.  Vagina: No lesions or abnormalities noted.  Support: Normal pelvic support.  Urethra No masses tenderness or scarring.  Meatus Normal size without lesions or prolapse.  Cervix: Normal appearance.  No lesions.  Anus: Normal exam.  No lesions.  Perineum: Normal exam.  No lesions.        Bimanual   Uterus: Enlarged approximately to 7 weeks  Non-tender.  Mobile.  AV.  Adnexae: No masses.  Non-tender to palpation.  Cul-de-sac: Negative for abnormality.     Assessment: Missed menses - Plan: POCT urinalysis dipstick  First trimester bleeding  Type 2 diabetes mellitus without complication, without long-term current use of insulin ([place]) - Patient reports that she has currently managed this with her diet alone.  Elderly multigravida in first trimester      Plan:       Meds @[facility] @       Orders Orders Placed This Encounter  Procedures  . POCT urinalysis dipstick    1.  Ultrasound to confirm fetal viability. SAb I have discussed the possibility of miscarriage with the patient.  I have informed her that vaginal bleeding, cramping or passage of tissue are the most common signs of miscarriage.  Should she have heavy bleeding, pass tissue or have other problems, she has been informed to call the office immediately.  I have advised her to remain at pelvic rest for at least one week after her last episode of bleeding.  If  she is currently working, I have instructed her to discontinue until this situation resolves.  Complete versus incomplete miscarriage was discussed and the patient is aware that should she develop heavy bleeding, fever, or persistent  crampy pelvic pain she may require a D & E to remove the remaining portion of the miscarried pregnancy.  I advised her to keep me informed should her condition change.     2.  Will address diabetes, advanced maternal age and other issues after confirmation of pregnancy.         F/U  No Follow-up on file.  - 6 days  Finis Bud, M.D. 08/10/2016 2:37 PM

## 2016-08-10 NOTE — Progress Notes (Signed)
Pt is here as a transfer. States she has several positive pregnancy tests. States she has some bright red bleeding that started at 4-6 weeks. LMP 06/21/16.

## 2016-08-14 ENCOUNTER — Ambulatory Visit (INDEPENDENT_AMBULATORY_CARE_PROVIDER_SITE_OTHER): Payer: BLUE CROSS/BLUE SHIELD

## 2016-08-14 DIAGNOSIS — O209 Hemorrhage in early pregnancy, unspecified: Secondary | ICD-10-CM | POA: Diagnosis not present

## 2016-08-16 ENCOUNTER — Encounter: Payer: BLUE CROSS/BLUE SHIELD | Admitting: Obstetrics and Gynecology

## 2016-08-17 ENCOUNTER — Ambulatory Visit (INDEPENDENT_AMBULATORY_CARE_PROVIDER_SITE_OTHER): Payer: BLUE CROSS/BLUE SHIELD | Admitting: Obstetrics and Gynecology

## 2016-08-17 VITALS — BP 129/80 | HR 88 | Ht 68.0 in | Wt 225.4 lb

## 2016-08-17 DIAGNOSIS — Z3491 Encounter for supervision of normal pregnancy, unspecified, first trimester: Secondary | ICD-10-CM

## 2016-08-17 DIAGNOSIS — O09521 Supervision of elderly multigravida, first trimester: Secondary | ICD-10-CM

## 2016-08-17 DIAGNOSIS — O219 Vomiting of pregnancy, unspecified: Secondary | ICD-10-CM

## 2016-08-17 DIAGNOSIS — O26851 Spotting complicating pregnancy, first trimester: Secondary | ICD-10-CM

## 2016-08-17 LAB — POCT URINALYSIS DIPSTICK
BILIRUBIN UA: NEGATIVE
GLUCOSE UA: NEGATIVE
LEUKOCYTES UA: NEGATIVE
NITRITE UA: NEGATIVE
Protein, UA: NEGATIVE
RBC UA: NEGATIVE
Spec Grav, UA: >=1.03
UROBILINOGEN UA: NEGATIVE
pH, UA: 5

## 2016-08-17 MED ORDER — DOXYLAMINE-PYRIDOXINE 10-10 MG PO TBEC: 4.0000 | DELAYED_RELEASE_TABLET | Freq: Every day | ORAL | 1 refills | Status: AC

## 2016-08-17 NOTE — Progress Notes (Signed)
HPI:      [name] is a 41 y.o. G3P1011 who LMP was Patient's last menstrual period was 06/11/2016 (exact date)..  Subjective: She presents today at [redacted] weeks gestation for complaint of daily nausea and vomiting. Her first trimester spotting has resolved and she is no longer bleeding. She had an ultrasound which reveals an 8 week intrauterine gestation consistent with her menstrual period dating. Of significance some small fibroids were also noted on ultrasound(subserosal)    Hx: The following portions of the patient's history were reviewed and updated as appropriate:           She  has a past medical history of Diabetes mellitus type 2, uncontrolled, without complications ([place]) (123XX123); Gestational diabetes; History of polycystic ovaries; Hyperlipidemia LDL goal <70 (06/2014); Kienbock's avascular necrosis of lunate, adult; Obesity; and Seronegative arthritis.        ROS: Constitutional: Denied constitutional symptoms, night sweats, recent illness, fatigue, fever, insomnia and weight loss.  Eyes: Denied eye symptoms, eye pain, photophobia, vision change and visual disturbance.  Ears/Nose/Throat/Neck: Denied ear, nose, throat or neck symptoms, hearing loss, nasal discharge, sinus congestion and sore throat.  Cardiovascular: Denied cardiovascular symptoms, arrhythmia, chest pain/pressure, edema, exercise intolerance, orthopnea and palpitations.  Respiratory: Denied pulmonary symptoms, asthma, pleuritic pain, productive sputum, cough, dyspnea and wheezing.  Gastrointestinal: Denied, gastro-esophageal reflux, melena, nausea and vomiting.  Genitourinary: Reports no further spotting  Musculoskeletal: Denied musculoskeletal symptoms, stiffness, swelling, muscle weakness and myalgia.  Dermatologic: Denied dermatology symptoms, rash and scar.  Neurologic: Denied neurology symptoms, dizziness, headache, neck pain and syncope.  Psychiatric: Denied psychiatric symptoms, anxiety and depression.   Endocrine: Denied endocrine symptoms including hot flashes and night sweats.   Meds: She has a current medication list which includes the following prescription(s): accu-chek softclix lancets, glucose blood, prenatal multivitamin, and doxylamine-pyridoxine.  Objective: Vitals:   08/17/16 0807  BP: 129/80  Pulse: 88             Ultrasound results reviewed in detail with the patient specifically discussed fibroids.  Assessment: 1. Prenatal care in first trimester   2. Nausea and vomiting during pregnancy   3. Spotting affecting pregnancy in first trimester   4. Elderly multigravida in first trimester   She is keeping some food down but limited amounts. She is interested in advanced maternal age self-retaining DNA testing (panorama) First trimester spotting resolved  Plan:            1.  Diclegis 2.  Patient would like testing at 12 weeks for advanced maternal age then likely AFP at greater than 15 weeks   Orders Meds ordered this encounter  Medications  . Prenatal Vit-Fe Fumarate-FA (PRENATAL MULTIVITAMIN) TABS tablet    Sig: Take 1 tablet by mouth daily at 12 noon.  . Doxylamine-Pyridoxine (DICLEGIS) 10-10 MG TBEC    Sig: Take 4 tablets by mouth daily. Take one tablet in the morning one in the afternoon and two tablets at bedtime for nausea    Dispense:  50 tablet    Refill:  1           F/U  Return in about 4 weeks (around 09/14/2016) for Pt to contact us if symptoms worsen.  Finis Bud, M.D. 08/17/2016 8:54 AM

## 2016-08-17 NOTE — Progress Notes (Signed)
Pt is a 41 yo P1 G3 with 1 miscarriage. Pt is c/o severe nausea and vomiting lasting all day. Pt is [redacted] weeks pregnant. Also would like to discuss U/S results.

## 2016-08-18 NOTE — Addendum Note (Signed)
Addended by: Finis Bud on: 08/18/2016 07:47 PM   Modules accepted: Miquel Dunn

## 2016-08-20 ENCOUNTER — Encounter: Payer: BLUE CROSS/BLUE SHIELD | Admitting: Obstetrics and Gynecology

## 2016-08-20 ENCOUNTER — Ambulatory Visit (INDEPENDENT_AMBULATORY_CARE_PROVIDER_SITE_OTHER): Payer: BLUE CROSS/BLUE SHIELD | Admitting: Obstetrics and Gynecology

## 2016-08-20 VITALS — BP 138/83 | HR 89 | Ht 68.0 in | Wt 227.0 lb

## 2016-08-20 DIAGNOSIS — E669 Obesity, unspecified: Secondary | ICD-10-CM

## 2016-08-20 DIAGNOSIS — Z3481 Encounter for supervision of other normal pregnancy, first trimester: Secondary | ICD-10-CM

## 2016-08-20 DIAGNOSIS — Z113 Encounter for screening for infections with a predominantly sexual mode of transmission: Secondary | ICD-10-CM

## 2016-08-20 DIAGNOSIS — Z1389 Encounter for screening for other disorder: Secondary | ICD-10-CM

## 2016-08-20 MED ORDER — PROMETHAZINE HCL 25 MG PO TABS: 25.0000 mg | ORAL_TABLET | Freq: Four times a day (QID) | ORAL | 1 refills | Status: DC | PRN

## 2016-08-20 NOTE — Addendum Note (Signed)
Addended by: Earl Lagos on: 08/20/2016 11:46 AM   Modules accepted: Orders

## 2016-08-20 NOTE — Progress Notes (Addendum)
Priscilla King presents for NOB nurse interview visit. Pregnancy confirmation done by Dr. Waynetta Pean on 08/10/2016.  G-3  P-1011.  LMP: 06/21/2016. Pregnancy education material explained and given. No cats in the home. NOB labs ordered. TSH labs ordered-BMI-33.4. HgbA1c (6.0), done by her PCP on 08/01/2016.  HIV labs and Drug screen were explained optional and she did not decline. Drug screen ordered. PNV encouraged. Genetic screening, wants Panorama test done and will do at NOB visit.  Pt continues to c/o nausea and Diclegesis is not working for her, so phenergan rx was sent to pharmacy. Pt. To follow up with provider in 4 weeks for NOB physical.  All questions answered.

## 2016-08-20 NOTE — Patient Instructions (Signed)
Pregnancy and Zika Virus Disease Introduction Zika virus disease, or Zika, is an illness that can spread to people from mosquitoes that carry the virus. It may also spread from person to person through infected body fluids. Zika first occurred in [place], but recently it has spread to new areas. The virus occurs in tropical climates. The location of Zika continues to change. Most people who become infected with Zika virus do not develop serious illness. However, Zika may cause birth defects in an unborn baby whose mother is infected with the virus. It may also increase the risk of miscarriage. What are the symptoms of Zika virus disease? In many cases, people who have been infected with Zika virus do not develop any symptoms. If symptoms appear, they usually start about a week after the person is infected. Symptoms are usually mild. They may include:  Fever.  Rash.  Red eyes.  Joint pain. How does Zika virus disease spread? The main way that [place] virus spreads is through the bite of a certain type of mosquito. Unlike most types of mosquitos, which bite only at night, the type of mosquito that carries Zika virus bites both at night and during the day. Zika virus can also spread through sexual contact, through a blood transfusion, and from a mother to her baby before or during birth. Once you have had Zika virus disease, it is unlikely that you will get it again. Can I pass Zika to my baby during pregnancy? Yes, Zika can pass from a mother to her baby before or during birth. What problems can Zika cause for my baby? A woman who is infected with Zika virus while pregnant is at risk of having her baby born with a condition in which the brain or head is smaller than expected (microcephaly). Babies who have microcephaly can have developmental delays, seizures, hearing problems, and vision problems. Having Zika virus disease during pregnancy can also increase the risk of miscarriage. How can Zika  virus disease be prevented? There is no vaccine to prevent Zika. The best way to prevent the disease is to avoid infected mosquitoes and avoid exposure to body fluids that can spread the virus. Avoid any possible exposure to [place] by taking the following precautions. For women and their sex partners:  Avoid traveling to high-risk areas. The locations where [place] is being reported change often. To identify high-risk areas, check the CDC travel website: CreditChaos.com.ee  If you or your sex partner must travel to a high-risk area, talk with a health care provider before and after traveling.  Take all precautions to avoid mosquito bites if you live in, or travel to, any of the high-risk areas. Insect repellents are safe to use during pregnancy.  Ask your health care provider when it is safe to have sexual contact. For women:  If you are pregnant or trying to become pregnant, avoid sexual contact with persons who may have been exposed to [place] virus, persons who have possible symptoms of Zika, or persons whose history you are unsure about. If you choose to have sexual contact with someone who may have been exposed to [place] virus, use condoms correctly during the entire duration of sexual activity, every time. Do not share sexual devices, as you may be exposed to body fluids.  Ask your health care provider about when it is safe to attempt pregnancy after a possible exposure to [place] virus. What steps should I take to avoid mosquito bites? Take these steps to avoid mosquito bites when you  are in a high-risk area:  Wear loose clothing that covers your arms and legs.  Limit your outdoor activities.  Do not open windows unless they have window screens.  Sleep under mosquito nets.  Use insect repellent. The best insect repellents have:  DEET, picaridin, oil of lemon eucalyptus (OLE), or [id number] in them.  Higher amounts of an active ingredient in them.  Remember that insect repellents  are safe to use during pregnancy.  Do not use OLE on children who are younger than 65 years of age. Do not use insect repellent on babies who are younger than 33 months of age.  Cover your child's stroller with mosquito netting. Make sure the netting fits snugly and that any loose netting does not cover your child's mouth or nose. Do not use a blanket as a mosquito-protection cover.  Do not apply insect repellent underneath clothing.  If you are using sunscreen, apply the sunscreen before applying the insect repellent.  Treat clothing with permethrin. Do not apply permethrin directly to your skin. Follow label directions for safe use.  Get rid of standing water, where mosquitoes may reproduce. Standing water is often found in items such as buckets, bowls, animal food dishes, and flowerpots. When you return from traveling to any high-risk area, continue taking actions to protect yourself against mosquito bites for 3 weeks, even if you show no signs of illness. This will prevent spreading Zika virus to uninfected mosquitoes. What should I know about the sexual transmission of Zika? People can spread Zika to their sexual partners during vaginal, anal, or oral sex, or by sharing sexual devices. Many people with [place] do not develop symptoms, so a person could spread the disease without knowing that they are infected. The greatest risk is to women who are pregnant or who may become pregnant. Zika virus can live longer in semen than it can live in blood. Couples can prevent sexual transmission of the virus by:  Using condoms correctly during the entire duration of sexual activity, every time. This includes vaginal, anal, and oral sex.  Not sharing sexual devices. Sharing increases your risk of being exposed to body fluid from another person.  Avoiding all sexual activity until your health care provider says it is safe. Should I be tested for Zika virus? A sample of your blood can be tested for Zika  virus. A pregnant woman should be tested if she may have been exposed to the virus or if she has symptoms of Zika. She may also have additional tests done during her pregnancy, such ultrasound testing. Talk with your health care provider about which tests are recommended. This information is not intended to replace advice given to you by your health care provider. Make sure you discuss any questions you have with your health care provider. Document Released: 03/30/2015 Document Revised: 12/15/2015 Document Reviewed: 03/23/2015  2017 Elsevier Minor Illnesses and Medications in Pregnancy  Cold/Flu:  Sudafed for congestion- Robitussin (plain) for cough- Tylenol for discomfort.  Please follow the directions on the label.  Try not to take any more than needed.  OTC Saline nasal spray and air humidifier or cool-mist  Vaporizer to sooth nasal irritation and to loosen congestion.  It is also important to increase intake of non carbonated fluids, especially if you have a fever.  Constipation:  Colace-2 capsules at bedtime; Metamucil- follow directions on label; Senokot- 1 tablet at bedtime.  Any one of these medications can be used.  It is also very important to increase  fluids and fruits along with regular exercise.  If problem persists please call the office.  Diarrhea:  Kaopectate as directed on the label.  Eat a bland diet and increase fluids.  Avoid highly seasoned foods.  Headache:  Tylenol 1 or 2 tablets every 3-4 hours as needed  Indigestion:  Maalox, Mylanta, Tums or Rolaids- as directed on label.  Also try to eat small meals and avoid fatty, greasy or spicy foods.  Nausea with or without Vomiting:  Nausea in pregnancy is caused by increased levels of hormones in the body which influence the digestive system and cause irritation when stomach acids accumulate.  Symptoms usually subside after 1st trimester of pregnancy.  Try the following: 1. Keep saltines, graham crackers or dry toast by your bed to  eat upon awakening. 2. Don't let your stomach get empty.  Try to eat 5-6 small meals per day instead of 3 large ones. 3. Avoid greasy fatty or highly seasoned foods.  4. Take OTC Unisom 1 tablet at bed time along with OTC Vitamin B6 25-50 mg 3 times per day.    If nausea continues with vomiting and you are unable to keep down food and fluids you may need a prescription medication.  Please notify your provider.   Sore throat:  Chloraseptic spray, throat lozenges and or plain Tylenol.  Vaginal Yeast Infection:  OTC Monistat for 7 days as directed on label.  If symptoms do not resolve within a week notify provider.  If any of the above problems do not subside with recommended treatment please call the office for further assistance.   Do not take Aspirin, Advil, Motrin or Ibuprofen.  * * OTC= Over the counter Hyperemesis Gravidarum Hyperemesis gravidarum is a severe form of nausea and vomiting that happens during pregnancy. Hyperemesis is worse than morning sickness. It may cause you to have nausea or vomiting all day for many days. It may keep you from eating and drinking enough food and liquids. Hyperemesis usually occurs during the first half (the first 20 weeks) of pregnancy. It often goes away once a woman is in her second half of pregnancy. However, sometimes hyperemesis continues through an entire pregnancy. What are the causes? The cause of this condition is not known. It may be related to changes in chemicals (hormones) in the body during pregnancy, such as the high level of pregnancy hormone (human chorionic gonadotropin) or the increase in the female sex hormone (estrogen). What are the signs or symptoms? Symptoms of this condition include:  Severe nausea and vomiting.  Nausea that does not go away.  Vomiting that does not allow you to keep any food down.  Weight loss.  Body fluid loss (dehydration).  Having no desire to eat, or not liking food that you have previously  enjoyed. How is this diagnosed? This condition may be diagnosed based on:  A physical exam.  Your medical history.  Your symptoms.  Blood tests.  Urine tests. How is this treated? This condition may be managed with medicine. If medicines to do not help relieve nausea and vomiting, you may need to receive fluids through an IV tube at the hospital. Follow these instructions at home:  Take over-the-counter and prescription medicines only as told by your health care provider.  Avoid iron pills and multivitamins that contain iron for the first 3-4 months of pregnancy. If you take prescription iron pills, do not stop taking them unless your health care provider approves.  Take the following actions to help  prevent nausea and vomiting:  In the morning, before getting out of bed, try eating a couple of dry crackers or a piece of toast.  Avoid foods and smells that upset your stomach. Fatty and spicy foods may make nausea worse.  Eat 5-6 small meals a day.  Do not drink fluids while eating meals. Drink between meals.  Eat or suck on things that have ginger in them. Ginger can help relieve nausea.  Avoid food preparation. The smell of food can spoil your appetite or trigger nausea.  Follow instructions from your health care provider about eating or drinking restrictions.  For snacks, eat high-protein foods, such as cheese.  Keep all follow-up and pre-birth (prenatal) visits as told by your health care provider. This is important. Contact a health care provider if:  You have pain in your abdomen.  You have a severe headache.  You have vision problems.  You are losing weight. Get help right away if:  You cannot drink fluids without vomiting.  You vomit blood.  You have constant nausea and vomiting.  You are very weak.  You are very thirsty.  You feel dizzy.  You faint.  You have a fever or other symptoms that last for more than 2-3 days.  You have a fever and  your symptoms suddenly get worse. Summary  Hyperemesis gravidarum is a severe form of nausea and vomiting that happens during pregnancy.  Making some changes to your eating habits may help relieve nausea and vomiting.  This condition may be managed with medicine.  If medicines to do not help relieve nausea and vomiting, you may need to receive fluids through an IV tube at the hospital. This information is not intended to replace advice given to you by your health care provider. Make sure you discuss any questions you have with your health care provider. Document Released: 07/09/2005 Document Revised: 03/07/2016 Document Reviewed: 03/07/2016 Elsevier Interactive Patient Education  2017 [employer] of Pregnancy The first trimester of pregnancy is from week 1 until the end of week 12 (months 1 through 3). During this time, your baby will begin to develop inside you. At 6-8 weeks, the eyes and face are formed, and the heartbeat can be seen on ultrasound. At the end of 12 weeks, all the baby's organs are formed. Prenatal care is all the medical care you receive before the birth of your baby. Make sure you get good prenatal care and follow all of your doctor's instructions. Follow these instructions at home: Medicines  Take medicine only as told by your doctor. Some medicines are safe and some are not during pregnancy.  Take your prenatal vitamins as told by your doctor.  Take medicine that helps you poop (stool softener) as needed if your doctor says it is okay. Diet  Eat regular, healthy meals.  Your doctor will tell you the amount of weight gain that is right for you.  Avoid raw meat and uncooked cheese.  If you feel sick to your stomach (nauseous) or throw up (vomit):  Eat 4 or 5 small meals a day instead of 3 large meals.  Try eating a few soda crackers.  Drink liquids between meals instead of during meals.  If you have a hard time pooping  (constipation):  Eat high-fiber foods like fresh vegetables, fruit, and whole grains.  Drink enough fluids to keep your pee (urine) clear or pale yellow. Activity and Exercise  Exercise only as told by your doctor. Stop exercising if  you have cramps or pain in your lower belly (abdomen) or low back.  Try to avoid standing for long periods of time. Move your legs often if you must stand in one place for a long time.  Avoid heavy lifting.  Wear low-heeled shoes. Sit and stand up straight.  You can have sex unless your doctor tells you not to. Relief of Pain or Discomfort  Wear a good support bra if your breasts are sore.  Take warm water baths (sitz baths) to soothe pain or discomfort caused by hemorrhoids. Use hemorrhoid cream if your doctor says it is okay.  Rest with your legs raised if you have leg cramps or low back pain.  Wear support hose if you have puffy, bulging veins (varicose veins) in your legs. Raise (elevate) your feet for 15 minutes, 3-4 times a day. Limit salt in your diet. Prenatal Care  Schedule your prenatal visits by the twelfth week of pregnancy.  Write down your questions. Take them to your prenatal visits.  Keep all your prenatal visits as told by your doctor. Safety  Wear your seat belt at all times when driving.  Make a list of emergency phone numbers. The list should include numbers for family, friends, the hospital, and police and fire departments. General Tips  Ask your doctor for a referral to a local prenatal class. Begin classes no later than at the start of month 6 of your pregnancy.  Ask for help if you need counseling or help with nutrition. Your doctor can give you advice or tell you where to go for help.  Do not use hot tubs, steam rooms, or saunas.  Do not douche or use tampons or scented sanitary pads.  Do not cross your legs for long periods of time.  Avoid litter boxes and soil used by cats.  Avoid all smoking, herbs, and  alcohol. Avoid drugs not approved by your doctor.  Do not use any tobacco products, including cigarettes, chewing tobacco, and electronic cigarettes. If you need help quitting, ask your doctor. You may get counseling or other support to help you quit.  Visit your dentist. At home, brush your teeth with a soft toothbrush. Be gentle when you floss. Get help if:  You are dizzy.  You have mild cramps or pressure in your lower belly.  You have a nagging pain in your belly area.  You continue to feel sick to your stomach, throw up, or have watery poop (diarrhea).  You have a bad smelling fluid coming from your vagina.  You have pain with peeing (urination).  You have increased puffiness (swelling) in your face, hands, legs, or ankles. Get help right away if:  You have a fever.  You are leaking fluid from your vagina.  You have spotting or bleeding from your vagina.  You have very bad belly cramping or pain.  You gain or lose weight rapidly.  You throw up blood. It may look like coffee grounds.  You are around people who have [place] measles, fifth disease, or chickenpox.  You have a very bad headache.  You have shortness of breath.  You have any kind of trauma, such as from a fall or a car accident. This information is not intended to replace advice given to you by your health care provider. Make sure you discuss any questions you have with your health care provider. Document Released: 12/26/2007 Document Revised: 12/15/2015 Document Reviewed: 05/19/2013 Elsevier Interactive Patient Education  2017 [employer]. Commonly Asked Questions  During Pregnancy  Cats: A parasite can be excreted in cat feces.  To avoid exposure you need to have another person empty the little box.  If you must empty the litter box you will need to wear gloves.  Wash your hands after handling your cat.  This parasite can also be found in raw or undercooked meat so this should also be avoided.  Colds,  Sore Throats, Flu: Please check your medication sheet to see what you can take for symptoms.  If your symptoms are unrelieved by these medications please call the office.  Dental Work: Most any dental work [employer] recommends is permitted.  X-rays should only be taken during the first trimester if absolutely necessary.  Your abdomen should be shielded with a lead apron during all x-rays.  Please notify your provider prior to receiving any x-rays.  Novocaine is fine; gas is not recommended.  If your dentist requires a note from [place] prior to dental work please call the office and we will provide one for you.  Exercise: Exercise is an important part of staying healthy during your pregnancy.  You may continue most exercises you were accustomed to prior to pregnancy.  Later in your pregnancy you will most likely notice you have difficulty with activities requiring balance like riding a bicycle.  It is important that you listen to your body and avoid activities that put you at a higher risk of falling.  Adequate rest and staying well hydrated are a must!  If you have questions about the safety of specific activities ask your provider.    Exposure to Children with illness: Try to avoid obvious exposure; report any symptoms to [place] when noted,  If you have chicken pos, red measles or mumps, you should be immune to these diseases.   Please do not take any vaccines while pregnant unless you have checked with your OB provider.  Fetal Movement: After 28 weeks we recommend you do "kick counts" twice daily.  Lie or sit down in a calm quiet environment and count your baby movements "kicks".  You should feel your baby at least 10 times per hour.  If you have not felt 10 kicks within the first hour get up, walk around and have something sweet to eat or drink then repeat for an additional hour.  If count remains less than 10 per hour notify your provider.  Fumigating: Follow your pest control agent's advice as to how long  to stay out of your home.  Ventilate the area well before re-entering.  Hemorrhoids:   Most over-the-counter preparations can be used during pregnancy.  Check your medication to see what is safe to use.  It is important to use a stool softener or fiber in your diet and to drink lots of liquids.  If hemorrhoids seem to be getting worse please call the office.   Hot Tubs:  Hot tubs Jacuzzis and saunas are not recommended while pregnant.  These increase your internal body temperature and should be avoided.  Intercourse:  Sexual intercourse is safe during pregnancy as long as you are comfortable, unless otherwise advised by your provider.  Spotting may occur after intercourse; report any bright red bleeding that is heavier than spotting.  Labor:  If you know that you are in labor, please go to the hospital.  If you are unsure, please call the office and let us help you decide what to do.  Lifting, straining, etc:  If your job requires heavy lifting or  straining please check with your provider for any limitations.  Generally, you should not lift items heavier than that you can lift simply with your hands and arms (no back muscles)  Painting:  Paint fumes do not harm your pregnancy, but may make you ill and should be avoided if possible.  Latex or water based paints have less odor than oils.  Use adequate ventilation while painting.  Permanents & Hair Color:  Chemicals in hair dyes are not recommended as they cause increase hair dryness which can increase hair loss during pregnancy.  " Highlighting" and permanents are allowed.  Dye may be absorbed differently and permanents may not hold as well during pregnancy.  Sunbathing:  Use a sunscreen, as skin burns easily during pregnancy.  Drink plenty of fluids; avoid over heating.  Tanning Beds:  Because their possible side effects are still unknown, tanning beds are not recommended.  Ultrasound Scans:  Routine ultrasounds are performed at approximately 20  weeks.  You will be able to see your baby's general anatomy an if you would like to know the gender this can usually be determined as well.  If it is questionable when you conceived you may also receive an ultrasound early in your pregnancy for dating purposes.  Otherwise ultrasound exams are not routinely performed unless there is a medical necessity.  Although you can request a scan we ask that you pay for it when conducted because insurance does not cover " patient request" scans.  Work: If your pregnancy proceeds without complications you may work until your due date, unless your physician or employer advises otherwise.  Round Ligament Pain/Pelvic Discomfort:  Sharp, shooting pains not associated with bleeding are fairly common, usually occurring in the second trimester of pregnancy.  They tend to be worse when standing up or when you remain standing for long periods of time.  These are the result of pressure of certain pelvic ligaments called "round ligaments".  Rest, Tylenol and heat seem to be the most effective relief.  As the womb and fetus grow, they rise out of the pelvis and the discomfort improves.  Please notify the office if your pain seems different than that described.  It may represent a more serious condition.

## 2016-08-22 LAB — GC/CHLAMYDIA PROBE AMP

## 2016-08-22 LAB — MONITOR DRUG PROFILE 14(MW)
Amphetamine Scrn, Ur: NEGATIVE ng/mL
BARBITURATE SCREEN URINE: NEGATIVE ng/mL
BENZODIAZEPINE SCREEN, URINE: NEGATIVE ng/mL
Buprenorphine, Urine: NEGATIVE ng/mL
CANNABINOIDS UR QL SCN: NEGATIVE ng/mL
CREATININE(CRT), U: 186.9 mg/dL (ref 20.0–300.0)
Cocaine (Metab) Scrn, Ur: NEGATIVE ng/mL
FENTANYL, URINE: NEGATIVE pg/mL
METHADONE SCREEN, URINE: NEGATIVE ng/mL
Meperidine Screen, Urine: NEGATIVE ng/mL
OPIATE SCREEN URINE: NEGATIVE ng/mL
OXYCODONE+OXYMORPHONE UR QL SCN: NEGATIVE ng/mL
Ph of Urine: 5.6 (ref 4.5–8.9)
Phencyclidine Qn, Ur: NEGATIVE ng/mL
Propoxyphene Scrn, Ur: NEGATIVE ng/mL
SPECIFIC GRAVITY: 1.027
TRAMADOL SCREEN, URINE: NEGATIVE ng/mL

## 2016-08-22 LAB — URINALYSIS, ROUTINE W REFLEX MICROSCOPIC
Bilirubin, UA: NEGATIVE
Glucose, UA: NEGATIVE
Ketones, UA: NEGATIVE
Leukocytes, UA: NEGATIVE
Nitrite, UA: NEGATIVE
PH UA: 5.5 (ref 5.0–7.5)
PROTEIN UA: NEGATIVE
RBC, UA: NEGATIVE
Specific Gravity, UA: 1.029 (ref 1.005–1.030)
UUROB: 0.2 mg/dL (ref 0.2–1.0)

## 2016-08-22 LAB — NICOTINE SCREEN, URINE: COTININE UR QL SCN: NEGATIVE ng/mL

## 2016-08-22 LAB — URINE CULTURE, OB REFLEX

## 2016-08-22 LAB — CULTURE, OB URINE

## 2016-08-28 LAB — CBC WITH DIFFERENTIAL/PLATELET
BASOS ABS: 0 10*3/uL (ref 0.0–0.2)
Basos: 0 %
EOS (ABSOLUTE): 0.2 10*3/uL (ref 0.0–0.4)
Eos: 2 %
Hematocrit: 37.3 % (ref 34.0–46.6)
Hemoglobin: 12.8 g/dL (ref 11.1–15.9)
IMMATURE GRANS (ABS): 0 10*3/uL (ref 0.0–0.1)
IMMATURE GRANULOCYTES: 0 %
LYMPHS: 22 %
Lymphocytes Absolute: 2.4 10*3/uL (ref 0.7–3.1)
MCH: 29.6 pg (ref 26.6–33.0)
MCHC: 34.3 g/dL (ref 31.5–35.7)
MCV: 86 fL (ref 79–97)
MONOCYTES: 5 %
Monocytes Absolute: 0.5 10*3/uL (ref 0.1–0.9)
NEUTROS ABS: 8 10*3/uL — AB (ref 1.4–7.0)
Neutrophils: 71 %
Platelets: 284 10*3/uL (ref 150–379)
RBC: 4.33 x10E6/uL (ref 3.77–5.28)
RDW: 13.9 % (ref 12.3–15.4)
WBC: 11.2 10*3/uL — ABNORMAL HIGH (ref 3.4–10.8)

## 2016-08-28 LAB — VARICELLA ZOSTER ANTIBODY, IGG: Varicella zoster IgG: 3154 index (ref >165)

## 2016-08-28 LAB — RPR: RPR Ser Ql: NONREACTIVE

## 2016-08-28 LAB — HIV ANTIBODY (ROUTINE TESTING W REFLEX): HIV SCREEN 4TH GENERATION: NONREACTIVE

## 2016-08-28 LAB — TSH: TSH: 1.7 u[IU]/mL (ref 0.450–4.500)

## 2016-08-28 LAB — ANTIBODY SCREEN: Antibody Screen: NEGATIVE

## 2016-08-28 LAB — ABO

## 2016-08-28 LAB — SICKLE CELL SCREEN: Sickle Cell Screen: NEGATIVE

## 2016-08-28 LAB — RH TYPE: Rh Factor: POSITIVE

## 2016-08-28 LAB — RUBELLA SCREEN: Rubella Antibodies, IGG: 19.8 index (ref >0.99)

## 2016-08-28 LAB — HEPATITIS B SURFACE ANTIGEN: HEP B S AG: NEGATIVE

## 2016-08-29 ENCOUNTER — Encounter: Payer: Self-pay | Admitting: Obstetrics and Gynecology

## 2016-08-29 DIAGNOSIS — D259 Leiomyoma of uterus, unspecified: Secondary | ICD-10-CM | POA: Insufficient documentation

## 2016-08-30 ENCOUNTER — Encounter: Payer: BLUE CROSS/BLUE SHIELD | Admitting: Obstetrics & Gynecology

## 2016-08-30 ENCOUNTER — Telehealth: Payer: Self-pay

## 2016-08-30 NOTE — Telephone Encounter (Signed)
Spoke with patient to inform her of U/S results and she states the results were reviewed at her last visit.

## 2016-08-30 NOTE — Telephone Encounter (Signed)
-----   Message from Harlin Heys, MD sent at 08/29/2016  4:36 PM EST ----- Two small fibroids noted. Ultrasound otherwise appears normal.

## 2016-09-10 ENCOUNTER — Other Ambulatory Visit: Payer: Self-pay | Admitting: Certified Nurse Midwife

## 2016-09-10 ENCOUNTER — Encounter: Payer: Self-pay | Admitting: Obstetrics and Gynecology

## 2016-09-10 ENCOUNTER — Ambulatory Visit (INDEPENDENT_AMBULATORY_CARE_PROVIDER_SITE_OTHER): Payer: BLUE CROSS/BLUE SHIELD | Admitting: Obstetrics and Gynecology

## 2016-09-10 VITALS — BP 103/71 | HR 94 | Wt 233.4 lb

## 2016-09-10 DIAGNOSIS — D259 Leiomyoma of uterus, unspecified: Secondary | ICD-10-CM

## 2016-09-10 DIAGNOSIS — Z01419 Encounter for gynecological examination (general) (routine) without abnormal findings: Secondary | ICD-10-CM | POA: Diagnosis not present

## 2016-09-10 DIAGNOSIS — Z3481 Encounter for supervision of other normal pregnancy, first trimester: Secondary | ICD-10-CM | POA: Diagnosis not present

## 2016-09-10 DIAGNOSIS — Z3491 Encounter for supervision of normal pregnancy, unspecified, first trimester: Secondary | ICD-10-CM

## 2016-09-10 DIAGNOSIS — Z124 Encounter for screening for malignant neoplasm of cervix: Secondary | ICD-10-CM | POA: Diagnosis not present

## 2016-09-10 DIAGNOSIS — O3411 Maternal care for benign tumor of corpus uteri, first trimester: Secondary | ICD-10-CM

## 2016-09-10 LAB — POCT URINALYSIS DIPSTICK
BILIRUBIN UA: NEGATIVE
GLUCOSE UA: NEGATIVE
KETONES UA: NEGATIVE
Leukocytes, UA: NEGATIVE
Nitrite, UA: NEGATIVE
Protein, UA: NEGATIVE
RBC UA: NEGATIVE
SPEC GRAV UA: 1.01
UROBILINOGEN UA: NEGATIVE
pH, UA: 5

## 2016-09-10 NOTE — Addendum Note (Signed)
Addended by: Raliegh Ip on: 09/10/2016 11:27 AM   Modules accepted: Orders

## 2016-09-10 NOTE — Progress Notes (Signed)
S:  She states that her nausea and vomiting have improved although she has changed her diet to accommodate this. She still occasionally has vomiting but not daily. Reports no additional spotting. Reports that she had some hypertension with her last pregnancy at term but she says they didn't think it was preeclampsia. She has previously had diet-controlled diabetes when not pregnant and she has been checking her sugars. She says that because her diet has changed she has noticed some increase in her fastings. She says the highest fasting has been 126 but usually they are below 100. O: Physical examination General NAD, Conversant  HEENT Atraumatic; Op clear with mmm.  Normo-cephalic. Pupils reactive. Anicteric sclerae  Thyroid/Neck Smooth without nodularity or enlargement. Normal ROM.  Neck Supple.  Skin No rashes, lesions or ulceration. Normal palpated skin turgor. No nodularity.  Breasts: No masses or discharge.  Symmetric.  No axillary adenopathy.  Lungs: Clear to auscultation.No rales or wheezes. Normal Respiratory effort, no retractions.  Heart: NSR.  No murmurs or rubs appreciated. No periferal edema  Abdomen: Soft.  Non-tender.  No masses.  No HSM. No hernia  Extremities: Moves all appropriately.  Normal ROM for age. No lymphadenopathy.  Neuro: Oriented to PPT.  Normal mood. Normal affect.     Pelvic:   Vulva: Normal appearance.  No lesions.  Vagina: No lesions or abnormalities noted.  Support: Normal pelvic support.  Urethra No masses tenderness or scarring.  Meatus Normal size without lesions or prolapse.  Cervix: Normal appearance.  No lesions.  Anus: Normal exam.  No lesions.  Perineum: Normal exam.  No lesions.        Bimanual   Adnexae: No masses.  Non-tender to palpation.  Uterus: Enlarged. 12wks  Non-tender.  Mobile.  AV.  Adnexae: No masses.  Non-tender to palpation.  Cul-de-sac: Negative for abnormality.  Adnexae: No masses.  Non-tender to palpation.         Pelvimetry    Diagonal: Reached.  Spines: Average.  Sacrum: Concave.  Pubic Arch: Normal.   A: Advanced maternal age 2 week IUP crown-rump length consistent with menstrual dating     History of diet-controlled diabetes - A1c of 6 in the first trimester with occasional elevated fastings by patient fingerstick.     Uterine fibroids.     Spotting resolved     Possible history of late pregnancy-induced hypertension  P:  Pap and GC/CT performed      Panorama    AFP at 16 weeks      I have asked the patient to begin 4 times a day fingersticks with fastings and postprandials I will recheck this in 2 weeks and decide upon management of diet or versus medical treatment for diabetes.  Patient states that she has had diabetic counseling and does not want to see the diabetic educator.      We discussed pregnancy and hypertension in detail.       Prenatal labs reviewed with her directly.       Follow-up 2 weeks

## 2016-09-20 ENCOUNTER — Encounter: Payer: Self-pay | Admitting: Obstetrics and Gynecology

## 2016-09-20 ENCOUNTER — Telehealth: Payer: Self-pay

## 2016-09-20 NOTE — Telephone Encounter (Signed)
Spoke with patient- informed of neg results per provider. Also inquired about gender. Female per report

## 2016-09-20 NOTE — Telephone Encounter (Signed)
-----   Message from Harlin Heys, MD sent at 09/20/2016  3:45 PM EST ----- Fetal Screening test is negative for abnormalities.

## 2016-09-24 ENCOUNTER — Telehealth: Payer: Self-pay | Admitting: Obstetrics and Gynecology

## 2016-09-24 ENCOUNTER — Other Ambulatory Visit: Payer: Self-pay

## 2016-09-24 ENCOUNTER — Ambulatory Visit (INDEPENDENT_AMBULATORY_CARE_PROVIDER_SITE_OTHER): Payer: BLUE CROSS/BLUE SHIELD | Admitting: Obstetrics and Gynecology

## 2016-09-24 ENCOUNTER — Encounter: Payer: Self-pay | Admitting: Obstetrics and Gynecology

## 2016-09-24 VITALS — BP 117/79 | HR 103 | Wt 234.2 lb

## 2016-09-24 DIAGNOSIS — Z3492 Encounter for supervision of normal pregnancy, unspecified, second trimester: Secondary | ICD-10-CM

## 2016-09-24 DIAGNOSIS — E119 Type 2 diabetes mellitus without complications: Secondary | ICD-10-CM

## 2016-09-24 LAB — POCT URINALYSIS DIPSTICK
BILIRUBIN UA: NEGATIVE
Glucose, UA: NEGATIVE
Ketones, UA: NEGATIVE
Leukocytes, UA: NEGATIVE
NITRITE UA: NEGATIVE
Protein, UA: NEGATIVE
RBC UA: NEGATIVE
Spec Grav, UA: >=1.03
Urobilinogen, UA: NEGATIVE
pH, UA: 5

## 2016-09-24 MED ORDER — METFORMIN HCL 500 MG PO TABS: 500.0000 mg | ORAL_TABLET | Freq: Two times a day (BID) | ORAL | 1 refills | Status: DC

## 2016-09-24 NOTE — Progress Notes (Signed)
She has been doing her sugars approximately 4 times a day. She is unable to follow a strict diet as she continues to experience nausea and vomiting when she eats healthy food. She is able to eat as long as it is bread or cheese.  Her fasting sugars are elevated as our several of her 2 hour postprandials. She has previously taken Glucophage to manage her diabetes. - PLAN - start Glucophage 500 twice a day.  AFP next visit.

## 2016-09-24 NOTE — Telephone Encounter (Signed)
Metformin refilled

## 2016-09-24 NOTE — Telephone Encounter (Signed)
Pt was told we would send a refill for her [facility] to [facility] this am.   [facility] nothing [facility] been [facility] as of this afternoon.  Please verify.  Thanks Con Memos

## 2016-09-26 LAB — PAP IG AND HPV HIGH-RISK
HPV, HIGH-RISK: NEGATIVE
PAP SMEAR COMMENT: 0

## 2016-09-27 ENCOUNTER — Other Ambulatory Visit: Payer: Self-pay

## 2016-09-27 ENCOUNTER — Telehealth: Payer: Self-pay | Admitting: Obstetrics and Gynecology

## 2016-09-27 DIAGNOSIS — E119 Type 2 diabetes mellitus without complications: Secondary | ICD-10-CM

## 2016-09-27 MED ORDER — METFORMIN HCL 500 MG PO TABS: 500.0000 mg | ORAL_TABLET | Freq: Two times a day (BID) | ORAL | 1 refills | Status: DC

## 2016-09-27 NOTE — Telephone Encounter (Signed)
Pt came by office saying the [facility].  She would like to get started on the Rx asap.  She uses [facility].  Thanks Con Memos

## 2016-09-27 NOTE — Telephone Encounter (Signed)
[facility] - [facility] did not receive the escribed medication. VO given and pt informed.

## 2016-09-27 NOTE — Telephone Encounter (Signed)
Spoke with pt. Reassured her the script was escribed on 09/24/16. I called the pharmacy and the person I spoke with said to resend which I did.

## 2016-09-28 ENCOUNTER — Encounter: Payer: Self-pay | Admitting: Obstetrics and Gynecology

## 2016-09-28 ENCOUNTER — Ambulatory Visit (INDEPENDENT_AMBULATORY_CARE_PROVIDER_SITE_OTHER): Payer: BLUE CROSS/BLUE SHIELD | Admitting: Obstetrics and Gynecology

## 2016-09-28 VITALS — BP 160/105 | HR 116 | Wt 232.4 lb

## 2016-09-28 DIAGNOSIS — Z3492 Encounter for supervision of normal pregnancy, unspecified, second trimester: Secondary | ICD-10-CM

## 2016-09-28 LAB — POCT URINALYSIS DIPSTICK
BILIRUBIN UA: NEGATIVE
GLUCOSE UA: NEGATIVE
Ketones, UA: NEGATIVE
Leukocytes, UA: NEGATIVE
NITRITE UA: NEGATIVE
Protein, UA: NEGATIVE
RBC UA: NEGATIVE
Spec Grav, UA: 1.02
Urobilinogen, UA: NEGATIVE
pH, UA: 5

## 2016-09-28 NOTE — Progress Notes (Signed)
She presents today to discuss multiple issues. She had trouble getting her prescription for metformin but has now just started it. She will schedule in 2 weeks for follow-up daily blood sugars on metformin. She wanted to discuss her AFP and she will get this drawn on the 19th. She described significant mood changes which may be consistent with early depressive symptoms. We have discussed multiple ways to manage this and if these symptoms continue or worsen will strongly consider use of antidepressants.

## 2016-10-08 ENCOUNTER — Other Ambulatory Visit: Payer: BLUE CROSS/BLUE SHIELD

## 2016-10-08 ENCOUNTER — Telehealth: Payer: Self-pay

## 2016-10-08 ENCOUNTER — Other Ambulatory Visit: Payer: Self-pay | Admitting: Obstetrics and Gynecology

## 2016-10-08 ENCOUNTER — Encounter: Payer: BLUE CROSS/BLUE SHIELD | Admitting: Obstetrics and Gynecology

## 2016-10-08 DIAGNOSIS — Z3402 Encounter for supervision of normal first pregnancy, second trimester: Secondary | ICD-10-CM | POA: Diagnosis not present

## 2016-10-08 NOTE — Telephone Encounter (Signed)
Pt states lab tech(amy) was starring her down when she called her from the waiting room. Pt noticed her name was misspelled on the tube and brought it to Amy's attention. Pt states Amy told her it was spelled correctly she just could not read her handwriting. This distracted Amy and she missed the pts vein. She had a small bruise that was red on her left hand. Pt advised Amy to check the computer for the correct spelling. Amy threw those tubes out. She re wrote pts name on a new tube. She asked pt if she was happy with the writing on the new tubes. Pt states Amy was very unprofessional. I apologized for her experience. Gave her some ice to place on her hand. I made it clear that we do not do business this way at [facility] and made sure I would lbe sure the  office manager was aware. Advised that pt should consider getting her labs drawn at a [facility] or at [facility] moving further. Pt thanked me for my time and listening.    Priscilla King was made aware. Amy sat down with Priscilla King and myself. Amy did not deny any of the above conversation. She states that last time she had contact with this pt she was difficult. Conversation ended abruptly d/t Amy having a pt she needed to draw.

## 2016-10-11 ENCOUNTER — Ambulatory Visit (INDEPENDENT_AMBULATORY_CARE_PROVIDER_SITE_OTHER): Payer: BLUE CROSS/BLUE SHIELD | Admitting: Obstetrics and Gynecology

## 2016-10-11 ENCOUNTER — Encounter: Payer: Self-pay | Admitting: Obstetrics and Gynecology

## 2016-10-11 VITALS — BP 115/68 | HR 109 | Wt 235.1 lb

## 2016-10-11 DIAGNOSIS — O99342 Other mental disorders complicating pregnancy, second trimester: Secondary | ICD-10-CM

## 2016-10-11 DIAGNOSIS — E119 Type 2 diabetes mellitus without complications: Secondary | ICD-10-CM

## 2016-10-11 DIAGNOSIS — Z3492 Encounter for supervision of normal pregnancy, unspecified, second trimester: Secondary | ICD-10-CM

## 2016-10-11 DIAGNOSIS — F32A Depression, unspecified: Secondary | ICD-10-CM

## 2016-10-11 DIAGNOSIS — F329 Major depressive disorder, single episode, unspecified: Secondary | ICD-10-CM

## 2016-10-11 LAB — POCT URINALYSIS DIPSTICK
BILIRUBIN UA: NEGATIVE
Blood, UA: NEGATIVE
KETONES UA: NEGATIVE
LEUKOCYTES UA: NEGATIVE
Nitrite, UA: NEGATIVE
PROTEIN UA: NEGATIVE
SPEC GRAV UA: 1.015 (ref 1.030–1.035)
Urobilinogen, UA: NEGATIVE (ref ?–2.0)
pH, UA: 5 (ref 5.0–8.0)

## 2016-10-11 MED ORDER — SERTRALINE HCL 50 MG PO TABS: 50.0000 mg | ORAL_TABLET | Freq: Every day | ORAL | 1 refills | Status: DC

## 2016-10-11 MED ORDER — GLYBURIDE 5 MG PO TABS: 5.0000 mg | ORAL_TABLET | Freq: Two times a day (BID) | ORAL | 2 refills | Status: DC

## 2016-10-11 NOTE — Progress Notes (Signed)
1.  Gestational diabetes - patient with elevated fastings usually approximately 1:30 despite use of Glucophage twice a day.  I will change her medication to glyburide 5 twice a day. Increase dose as necessary. Should this fail we have discussed insulin in detail. 2.  Depression:  Patient with significant anxiety. Patient not functioning well. We talked about this at her last visit and it is same or worse. I will start her on Zoloft 50. 3.  AFP drawn-pending.

## 2016-10-12 ENCOUNTER — Telehealth: Payer: Self-pay | Admitting: *Deleted

## 2016-10-12 LAB — AFP, SERUM, OPEN SPINA BIFIDA
AFP MOM: 0.92
AFP Value: 23.9 ng/mL
Gest. Age on Collection Date: 15.6 weeks
Maternal Age At EDD: 41.1 years
OSBR RISK 1 IN: 10000
TEST RESULTS AFP: NEGATIVE
WEIGHT: 233 [lb_av]

## 2016-10-12 NOTE — Telephone Encounter (Signed)
Spoke with patient- assured her the 71 reading she got was normal per provider. She states she felt sweaty and lightheaded. She had some sugar and she felt better. Reassured her she did everything right and to do the same if it should happen again.

## 2016-10-12 NOTE — Telephone Encounter (Signed)
Patient called and stated that she took her fasting Blood sugar this morning and it was 74. 2hrs after breakfast it was 62. Patient states she is a diabetic and wanted to know if that is normal . She has some questions and concerns. Patient contact number is [phone]. Please advise. Thank you.

## 2016-10-15 ENCOUNTER — Telehealth: Payer: Self-pay

## 2016-10-15 NOTE — Telephone Encounter (Signed)
Informed pt of neg results

## 2016-10-15 NOTE — Telephone Encounter (Signed)
-----   Message from Harlin Heys, MD sent at 10/15/2016  8:39 AM EDT ----- Negative screen for NTD

## 2016-10-15 NOTE — Telephone Encounter (Signed)
Informed pt of normal AFP. Pt expressed concerns about how low BS should be. States she had a 2hr reading of 51 and she has been sweating and not feeling right. Please advise.

## 2016-10-15 NOTE — Telephone Encounter (Signed)
Pt instructed to decrease glyburide per provider. Pt understood instructions.

## 2016-10-23 ENCOUNTER — Ambulatory Visit (INDEPENDENT_AMBULATORY_CARE_PROVIDER_SITE_OTHER): Payer: BLUE CROSS/BLUE SHIELD | Admitting: Obstetrics and Gynecology

## 2016-10-23 ENCOUNTER — Encounter: Payer: Self-pay | Admitting: Obstetrics and Gynecology

## 2016-10-23 VITALS — BP 117/72 | HR 111 | Wt 235.3 lb

## 2016-10-23 DIAGNOSIS — O99342 Other mental disorders complicating pregnancy, second trimester: Secondary | ICD-10-CM

## 2016-10-23 DIAGNOSIS — F32A Depression, unspecified: Secondary | ICD-10-CM

## 2016-10-23 DIAGNOSIS — Z3492 Encounter for supervision of normal pregnancy, unspecified, second trimester: Secondary | ICD-10-CM

## 2016-10-23 DIAGNOSIS — E119 Type 2 diabetes mellitus without complications: Secondary | ICD-10-CM

## 2016-10-23 DIAGNOSIS — F329 Major depressive disorder, single episode, unspecified: Secondary | ICD-10-CM

## 2016-10-23 LAB — POCT URINALYSIS DIPSTICK
Bilirubin, UA: NEGATIVE
Glucose, UA: NEGATIVE
KETONES UA: NEGATIVE
LEUKOCYTES UA: NEGATIVE
NITRITE UA: NEGATIVE
PH UA: 5 (ref 5.0–8.0)
PROTEIN UA: NEGATIVE
RBC UA: NEGATIVE
Spec Grav, UA: 1.025 (ref 1.030–1.035)
Urobilinogen, UA: NEGATIVE (ref ?–2.0)

## 2016-10-23 NOTE — Progress Notes (Signed)
ROB:  Gestational diabetes - she had issues with hypoglycemia on glyburide twice a day so we switched it to 5 mg one time per day. She continues to experience some feelings likely consistent with hypoglycemia. I have asked her to split the pills and take 2.5 twice a day. 2.  Depression: Patient is on Zoloft and has noted some improvement. She has also noted some improvement in her anxiety and her energy. She will continue the Zoloft 3.  FAS ordered

## 2016-10-24 ENCOUNTER — Other Ambulatory Visit: Payer: Self-pay | Admitting: Obstetrics and Gynecology

## 2016-10-24 DIAGNOSIS — Z369 Encounter for antenatal screening, unspecified: Secondary | ICD-10-CM

## 2016-11-01 ENCOUNTER — Ambulatory Visit (INDEPENDENT_AMBULATORY_CARE_PROVIDER_SITE_OTHER): Payer: BLUE CROSS/BLUE SHIELD | Admitting: Obstetrics and Gynecology

## 2016-11-01 ENCOUNTER — Ambulatory Visit (INDEPENDENT_AMBULATORY_CARE_PROVIDER_SITE_OTHER): Payer: BLUE CROSS/BLUE SHIELD

## 2016-11-01 ENCOUNTER — Encounter: Payer: Self-pay | Admitting: Obstetrics and Gynecology

## 2016-11-01 VITALS — BP 109/69 | HR 85 | Wt 235.0 lb

## 2016-11-01 DIAGNOSIS — E669 Obesity, unspecified: Secondary | ICD-10-CM | POA: Insufficient documentation

## 2016-11-01 DIAGNOSIS — Z98891 History of uterine scar from previous surgery: Secondary | ICD-10-CM | POA: Insufficient documentation

## 2016-11-01 DIAGNOSIS — D259 Leiomyoma of uterus, unspecified: Secondary | ICD-10-CM

## 2016-11-01 DIAGNOSIS — Z369 Encounter for antenatal screening, unspecified: Secondary | ICD-10-CM

## 2016-11-01 DIAGNOSIS — O0992 Supervision of high risk pregnancy, unspecified, second trimester: Secondary | ICD-10-CM

## 2016-11-01 DIAGNOSIS — O3410 Maternal care for benign tumor of corpus uteri, unspecified trimester: Secondary | ICD-10-CM

## 2016-11-01 DIAGNOSIS — E119 Type 2 diabetes mellitus without complications: Secondary | ICD-10-CM

## 2016-11-01 DIAGNOSIS — O09522 Supervision of elderly multigravida, second trimester: Secondary | ICD-10-CM

## 2016-11-01 LAB — POCT URINALYSIS DIPSTICK
Bilirubin, UA: NEGATIVE
Blood, UA: NEGATIVE
Glucose, UA: NEGATIVE
Ketones, UA: NEGATIVE
Leukocytes, UA: NEGATIVE
Nitrite, UA: NEGATIVE
PROTEIN UA: NEGATIVE
UROBILINOGEN UA: 0.2 U/dL
pH, UA: 6.5 (ref 5.0–8.0)

## 2016-11-01 NOTE — Progress Notes (Signed)
ROB: Patient doing well.  Still noting some occasional hypoglyemic episodes at night with Glyburide, but decreasing in frequency. Is eating small snack (mostly bowl of cereal) at around 2-3 a.m.  Advised again on peanut butter (2-3 tbsp) before bed. Blood sugars otherwise wnl. S/p normal anatomy scan today, small fibroids present.  Discussed management of diabetes in pregnancy, including serial growth scans, antenatal testing, and delivery at 39 weeks or sooner if indicated. Patient notes that she would like to have a repeat C-section for mode of delivery.  Will discuss plans further in 3rd trimester.  Had normal genetic testing. RTC in 2-3 weeks.

## 2016-11-23 ENCOUNTER — Encounter: Payer: BLUE CROSS/BLUE SHIELD | Admitting: Obstetrics and Gynecology

## 2016-11-26 ENCOUNTER — Ambulatory Visit (INDEPENDENT_AMBULATORY_CARE_PROVIDER_SITE_OTHER): Payer: BLUE CROSS/BLUE SHIELD | Admitting: Obstetrics and Gynecology

## 2016-11-26 VITALS — BP 113/75 | HR 94 | Wt 238.0 lb

## 2016-11-26 DIAGNOSIS — O09522 Supervision of elderly multigravida, second trimester: Secondary | ICD-10-CM

## 2016-11-26 DIAGNOSIS — E119 Type 2 diabetes mellitus without complications: Secondary | ICD-10-CM

## 2016-11-26 DIAGNOSIS — O0992 Supervision of high risk pregnancy, unspecified, second trimester: Secondary | ICD-10-CM

## 2016-11-26 DIAGNOSIS — N898 Other specified noninflammatory disorders of vagina: Secondary | ICD-10-CM

## 2016-11-26 LAB — POCT URINALYSIS DIPSTICK
Bilirubin, UA: NEGATIVE
Glucose, UA: NEGATIVE
KETONES UA: NEGATIVE
Leukocytes, UA: NEGATIVE
Nitrite, UA: NEGATIVE
PROTEIN UA: NEGATIVE
RBC UA: NEGATIVE
UROBILINOGEN UA: 0.2 U/dL
pH, UA: 6 (ref 5.0–8.0)

## 2016-11-26 NOTE — Progress Notes (Signed)
ROB: Patient complains of pressure in vagina.  Also noting lots of moisture in vaginal area, unsure if it is fluid or discharge. Is sure it is not urine. Vaginal exam wnl. Wet prep neg. Discussed pressure is due to enlarging fetus, vaginal moisture due to leukorrhea of pregnancy.  Notes blood sugars are doing very well, no further episodes of hypoglycemia.  Did not bring log today. RTC in 2-3 weeks.

## 2016-12-13 ENCOUNTER — Encounter: Payer: Self-pay | Admitting: Obstetrics and Gynecology

## 2016-12-13 ENCOUNTER — Ambulatory Visit (INDEPENDENT_AMBULATORY_CARE_PROVIDER_SITE_OTHER): Payer: BLUE CROSS/BLUE SHIELD | Admitting: Obstetrics and Gynecology

## 2016-12-13 VITALS — BP 114/73 | HR 93 | Wt 246.2 lb

## 2016-12-13 DIAGNOSIS — O09522 Supervision of elderly multigravida, second trimester: Secondary | ICD-10-CM

## 2016-12-13 DIAGNOSIS — E119 Type 2 diabetes mellitus without complications: Secondary | ICD-10-CM

## 2016-12-13 DIAGNOSIS — O0992 Supervision of high risk pregnancy, unspecified, second trimester: Secondary | ICD-10-CM

## 2016-12-13 NOTE — Progress Notes (Signed)
ROB:  Sugars excellent.  Pt symptomatic when below 75 - we have discussed this.  Vaginal complaints mostly resolved.  Discussed future NSTs and growth scans beginning at 32 weeks.  Pt desires repeat CD to be scheduled @ Aug 30,31.

## 2016-12-13 NOTE — Progress Notes (Signed)
Pt was unable to give a urine specimen.

## 2016-12-14 ENCOUNTER — Encounter: Payer: BLUE CROSS/BLUE SHIELD | Admitting: Obstetrics and Gynecology

## 2016-12-18 ENCOUNTER — Ambulatory Visit (INDEPENDENT_AMBULATORY_CARE_PROVIDER_SITE_OTHER): Payer: BLUE CROSS/BLUE SHIELD | Admitting: Obstetrics and Gynecology

## 2016-12-18 VITALS — BP 133/80 | HR 106 | Wt 245.1 lb

## 2016-12-18 DIAGNOSIS — Z3492 Encounter for supervision of normal pregnancy, unspecified, second trimester: Secondary | ICD-10-CM

## 2016-12-18 DIAGNOSIS — F329 Major depressive disorder, single episode, unspecified: Secondary | ICD-10-CM

## 2016-12-18 DIAGNOSIS — E119 Type 2 diabetes mellitus without complications: Secondary | ICD-10-CM

## 2016-12-18 DIAGNOSIS — O99342 Other mental disorders complicating pregnancy, second trimester: Secondary | ICD-10-CM

## 2016-12-18 DIAGNOSIS — O09522 Supervision of elderly multigravida, second trimester: Secondary | ICD-10-CM

## 2016-12-18 LAB — POCT URINALYSIS DIPSTICK
BILIRUBIN UA: NEGATIVE
GLUCOSE UA: NEGATIVE
KETONES UA: NEGATIVE
Leukocytes, UA: NEGATIVE
Nitrite, UA: NEGATIVE
Protein, UA: NEGATIVE
RBC UA: NEGATIVE
SPEC GRAV UA: 1.015 (ref 1.010–1.025)
Urobilinogen, UA: 0.2 E.U./dL
pH, UA: 5 (ref 5.0–8.0)

## 2016-12-18 MED ORDER — SERTRALINE HCL 50 MG PO TABS: 50.0000 mg | ORAL_TABLET | Freq: Every day | ORAL | 1 refills | Status: DC

## 2016-12-18 NOTE — Progress Notes (Signed)
ROB:  Pt "ran out of zoloft last week"  Has had persistent H/A -now decreasing.  Increasing anxiety.  Took BP at home and found it to be elevated.  Thinks maybe the baby is not moving as much.  Reports sugars in good control. We discussed Zoloft in detail. Risks and benefits in pregnancy discussed.  Patient will restart Zoloft.  Plan to continue through the pregnancy and postpartum period.  Headache possibly caused by discontinuation of Zoloft.  We discussed blood pressure monitoring in detail.  Pt reports baby active today.  Discussed kick counts.

## 2017-01-02 ENCOUNTER — Ambulatory Visit (INDEPENDENT_AMBULATORY_CARE_PROVIDER_SITE_OTHER): Payer: BLUE CROSS/BLUE SHIELD | Admitting: Obstetrics and Gynecology

## 2017-01-02 VITALS — BP 115/75 | HR 97 | Wt 249.9 lb

## 2017-01-02 DIAGNOSIS — O09522 Supervision of elderly multigravida, second trimester: Secondary | ICD-10-CM

## 2017-01-02 DIAGNOSIS — O24312 Unspecified pre-existing diabetes mellitus in pregnancy, second trimester: Secondary | ICD-10-CM

## 2017-01-02 DIAGNOSIS — O0992 Supervision of high risk pregnancy, unspecified, second trimester: Secondary | ICD-10-CM

## 2017-01-02 DIAGNOSIS — Z98891 History of uterine scar from previous surgery: Secondary | ICD-10-CM

## 2017-01-02 LAB — POCT URINALYSIS DIPSTICK
BILIRUBIN UA: NEGATIVE
Glucose, UA: NEGATIVE
KETONES UA: NEGATIVE
LEUKOCYTES UA: NEGATIVE
Nitrite, UA: NEGATIVE
PH UA: 6 (ref 5.0–8.0)
Protein, UA: NEGATIVE
SPEC GRAV UA: 1.025 (ref 1.010–1.025)
Urobilinogen, UA: 0.2 E.U./dL

## 2017-01-02 NOTE — Progress Notes (Signed)
ROB: Doing well.  No complaints. Blood sugars all within normal limits. Does some exercising (walks).  Did not resume Zoloft after last visit (did not feel symptomatic anymore).  Discussed signs and symptoms of anxiety and depression, advised to resume if symptoms return. Desires to think about Tdap, but will likely get it next visit.  Desires to breastfeed,  desires unsure method (but considering IUD vs permanent sterilization) for contraception. Signed blood consent, discussed cord blood banking. To begin serial growth scans and antenatal testing at 32 weeks. RTC in 2 weeks.

## 2017-01-04 ENCOUNTER — Encounter: Payer: BLUE CROSS/BLUE SHIELD | Admitting: Obstetrics and Gynecology

## 2017-01-17 ENCOUNTER — Ambulatory Visit (INDEPENDENT_AMBULATORY_CARE_PROVIDER_SITE_OTHER): Payer: BLUE CROSS/BLUE SHIELD | Admitting: Obstetrics and Gynecology

## 2017-01-17 VITALS — BP 136/86 | HR 106 | Wt 249.7 lb

## 2017-01-17 DIAGNOSIS — Z98891 History of uterine scar from previous surgery: Secondary | ICD-10-CM

## 2017-01-17 DIAGNOSIS — Z130 Encounter for screening for diseases of the blood and blood-forming organs and certain disorders involving the immune mechanism: Secondary | ICD-10-CM

## 2017-01-17 DIAGNOSIS — Z3483 Encounter for supervision of other normal pregnancy, third trimester: Secondary | ICD-10-CM

## 2017-01-17 DIAGNOSIS — E119 Type 2 diabetes mellitus without complications: Secondary | ICD-10-CM

## 2017-01-17 DIAGNOSIS — O09523 Supervision of elderly multigravida, third trimester: Secondary | ICD-10-CM

## 2017-01-17 DIAGNOSIS — Z23 Encounter for immunization: Secondary | ICD-10-CM

## 2017-01-17 LAB — POCT URINALYSIS DIPSTICK
BILIRUBIN UA: NEGATIVE
Blood, UA: NEGATIVE
CLARITY UA: NEGATIVE
GLUCOSE UA: NEGATIVE
KETONES UA: 5
LEUKOCYTES UA: NEGATIVE
Nitrite, UA: NEGATIVE
Urobilinogen, UA: 0.2 E.U./dL
pH, UA: 6 (ref 5.0–8.0)

## 2017-01-17 MED ORDER — TETANUS-DIPHTH-ACELL PERTUSSIS 5-2.5-18.5 LF-MCG/0.5 IM SUSP
0.5000 mL | Freq: Once | INTRAMUSCULAR | Status: AC
  Administered 2017-01-17: 0.5 mL via INTRAMUSCULAR

## 2017-01-17 NOTE — Progress Notes (Signed)
ROB: Doing well. Does note some [place] [address], mostly at night, sometimes keeps her awake. Discussed warm baths, Tylenol ES prn, and Benadryl for sleep as needed.  Forgot blood glucose log but notes blood sugars have been wnl.  For Tdap today and CBC. To schedule for C-section next visit. To begin antenatal testing and growth scans next visit.  RTC in 2 weeks.

## 2017-01-17 NOTE — Progress Notes (Deleted)
ROB

## 2017-01-18 LAB — CBC
HEMATOCRIT: 34.3 % (ref 34.0–46.6)
HEMOGLOBIN: 10.7 g/dL — AB (ref 11.1–15.9)
MCH: 27.4 pg (ref 26.6–33.0)
MCHC: 31.2 g/dL — ABNORMAL LOW (ref 31.5–35.7)
MCV: 88 fL (ref 79–97)
PLATELETS: 243 10*3/uL (ref 150–379)
RBC: 3.91 x10E6/uL (ref 3.77–5.28)
RDW: 13.4 % (ref 12.3–15.4)
WBC: 9.4 10*3/uL (ref 3.4–10.8)

## 2017-01-24 ENCOUNTER — Telehealth: Payer: Self-pay | Admitting: Obstetrics and Gynecology

## 2017-01-24 NOTE — Telephone Encounter (Signed)
Called pt informed her of the need for iron advised pt to get 325mg . Pt gave verbal understanding.

## 2017-01-24 NOTE — Telephone Encounter (Signed)
Patient LVM that she needs to know the dosage of the iron she should take daily Please call

## 2017-01-28 ENCOUNTER — Ambulatory Visit (INDEPENDENT_AMBULATORY_CARE_PROVIDER_SITE_OTHER): Payer: BLUE CROSS/BLUE SHIELD | Admitting: Obstetrics and Gynecology

## 2017-01-28 VITALS — BP 108/68 | HR 95 | Wt 250.4 lb

## 2017-01-28 DIAGNOSIS — O09523 Supervision of elderly multigravida, third trimester: Secondary | ICD-10-CM

## 2017-01-28 DIAGNOSIS — Z98891 History of uterine scar from previous surgery: Secondary | ICD-10-CM

## 2017-01-28 DIAGNOSIS — E119 Type 2 diabetes mellitus without complications: Secondary | ICD-10-CM

## 2017-01-28 DIAGNOSIS — O99210 Obesity complicating pregnancy, unspecified trimester: Secondary | ICD-10-CM

## 2017-01-28 LAB — POCT URINALYSIS DIPSTICK
Bilirubin, UA: NEGATIVE
Glucose, UA: NEGATIVE
KETONES UA: NEGATIVE
LEUKOCYTES UA: NEGATIVE
Nitrite, UA: NEGATIVE
PH UA: 6 (ref 5.0–8.0)
RBC UA: NEGATIVE
Spec Grav, UA: 1.025 (ref 1.010–1.025)
Urobilinogen, UA: 0.2 E.U./dL

## 2017-01-28 NOTE — Progress Notes (Signed)
ROB: Blood sugars well controlled. No complaints today.  For growth scan this week and starting twice weekly NSTs this week. Discussed scheduling for C-section, desires 03/21/17, will schedule.  RTC in 2 weeks.     NONSTRESS TEST INTERPRETATION  INDICATIONS: Pre-existing diabetes in pregnancy, Obesity and  AMA  FHR baseline: 150 bpm  RESULTS:Reactive COMMENTS: No contractions   PLAN: 1. Continue fetal kick counts twice a day. 2. Continue antepartum testing as scheduled-Biweekly

## 2017-01-30 ENCOUNTER — Other Ambulatory Visit: Payer: BLUE CROSS/BLUE SHIELD

## 2017-02-01 ENCOUNTER — Ambulatory Visit: Payer: BLUE CROSS/BLUE SHIELD | Admitting: Obstetrics and Gynecology

## 2017-02-01 ENCOUNTER — Ambulatory Visit (INDEPENDENT_AMBULATORY_CARE_PROVIDER_SITE_OTHER): Payer: BLUE CROSS/BLUE SHIELD

## 2017-02-01 VITALS — BP 122/74 | HR 103 | Wt 250.6 lb

## 2017-02-01 DIAGNOSIS — E119 Type 2 diabetes mellitus without complications: Secondary | ICD-10-CM

## 2017-02-01 DIAGNOSIS — O09523 Supervision of elderly multigravida, third trimester: Secondary | ICD-10-CM | POA: Diagnosis not present

## 2017-02-01 DIAGNOSIS — O24312 Unspecified pre-existing diabetes mellitus in pregnancy, second trimester: Secondary | ICD-10-CM | POA: Diagnosis not present

## 2017-02-01 LAB — POCT URINALYSIS DIPSTICK
BILIRUBIN UA: NEGATIVE
GLUCOSE UA: NEGATIVE
Ketones, UA: NEGATIVE
Leukocytes, UA: NEGATIVE
NITRITE UA: NEGATIVE
PH UA: 6 (ref 5.0–8.0)
Protein, UA: NEGATIVE
Urobilinogen, UA: 0.2 E.U./dL

## 2017-02-01 NOTE — Progress Notes (Signed)
NONSTRESS TEST INTERPRETATION  INDICATIONS: Obesity and  AMA and Pre-existing diabetes in pregnancy   FHR baseline: 150bpm RESULTS:Reactive COMMENTS: NST reviewed by A. Marcelline Mates, MD   PLAN: 1. Continue fetal kick counts twice a day. 2. Continue antepartum testing as scheduled-Biweekly 3. RTC as scheduled   Priscilla King, [place]

## 2017-02-05 ENCOUNTER — Ambulatory Visit (INDEPENDENT_AMBULATORY_CARE_PROVIDER_SITE_OTHER): Payer: BLUE CROSS/BLUE SHIELD | Admitting: Obstetrics and Gynecology

## 2017-02-05 ENCOUNTER — Other Ambulatory Visit: Payer: BLUE CROSS/BLUE SHIELD

## 2017-02-05 VITALS — BP 114/74 | HR 96 | Wt 250.1 lb

## 2017-02-05 DIAGNOSIS — E119 Type 2 diabetes mellitus without complications: Secondary | ICD-10-CM | POA: Diagnosis not present

## 2017-02-05 DIAGNOSIS — O0993 Supervision of high risk pregnancy, unspecified, third trimester: Secondary | ICD-10-CM

## 2017-02-05 DIAGNOSIS — Z98891 History of uterine scar from previous surgery: Secondary | ICD-10-CM

## 2017-02-05 DIAGNOSIS — O09523 Supervision of elderly multigravida, third trimester: Secondary | ICD-10-CM

## 2017-02-05 LAB — POCT URINALYSIS DIPSTICK
BILIRUBIN UA: NEGATIVE
Blood, UA: NEGATIVE
GLUCOSE UA: NEGATIVE
KETONES UA: NEGATIVE
LEUKOCYTES UA: NEGATIVE
Nitrite, UA: NEGATIVE
PROTEIN UA: NEGATIVE
Spec Grav, UA: >=1.03 — AB (ref 1.010–1.025)
Urobilinogen, UA: 0.2 E.U./dL
pH, UA: 6.5 (ref 5.0–8.0)

## 2017-02-05 NOTE — Progress Notes (Signed)
Problem OB: Patient initially here for NST only, however noted to nurse that she was having intense vaginal/pelvic pain.  Has tried Tylenol, warm baths, pelvic girdle without relief. Denies vaginal discharge, itching, burning.  Pelvic exam with small amount of thin discharge, no odor, cervix closed.  Vertex presentation low in pelvis noted, also was source of pain when mobilized. Reassured patient that although fetal head was low, she showed no signs of preterm labor, and was the likely cause of her pain. Discussed yoga, pregnancy massage to help fetus possibly move into an alternative position, however counseled that she may continue to experience this discomfort until the end of the pregnancy. Patient notes understanding, just wanted to ensure that her baby was not trying to come early.    Also desired to discuss the results of her recent ultrasound. Reviewed recent ultrasound results with patient, growth at 72%ile, normal AFI.  Will need repeat growth scan in 4 weeks.   RTC in 1 week for routine OB appointment.

## 2017-02-05 NOTE — Progress Notes (Signed)
NONSTRESS TEST INTERPRETATION  INDICATIONS: Obesity and  AMA and pre-existing diabetes   FHR baseline: 130 bpm RESULTS:Reactive COMMENTS: No contractions present.    PLAN: 1. Continue fetal kick counts twice a day. 2. Continue antepartum testing as scheduled-Biweekly 3. Follow up as scheduled   Gordy Clement, [place]

## 2017-02-06 ENCOUNTER — Telehealth: Payer: Self-pay

## 2017-02-06 NOTE — Telephone Encounter (Signed)
Called pt informed her that FMLA paperwork is completed. Pt gave verbal understanding.

## 2017-02-08 ENCOUNTER — Ambulatory Visit (INDEPENDENT_AMBULATORY_CARE_PROVIDER_SITE_OTHER): Payer: BLUE CROSS/BLUE SHIELD | Admitting: Certified Nurse Midwife

## 2017-02-08 ENCOUNTER — Other Ambulatory Visit: Payer: BLUE CROSS/BLUE SHIELD

## 2017-02-08 VITALS — BP 106/62 | HR 88 | Wt 250.0 lb

## 2017-02-08 DIAGNOSIS — O0993 Supervision of high risk pregnancy, unspecified, third trimester: Secondary | ICD-10-CM | POA: Diagnosis not present

## 2017-02-08 DIAGNOSIS — O09523 Supervision of elderly multigravida, third trimester: Secondary | ICD-10-CM

## 2017-02-08 LAB — POCT URINALYSIS DIPSTICK
BILIRUBIN UA: NEGATIVE
Blood, UA: NEGATIVE
GLUCOSE UA: NEGATIVE
KETONES UA: 5
Leukocytes, UA: NEGATIVE
Nitrite, UA: NEGATIVE
Protein, UA: NEGATIVE
SPEC GRAV UA: 1.025 (ref 1.010–1.025)
Urobilinogen, UA: 0.2 E.U./dL
pH, UA: 6 (ref 5.0–8.0)

## 2017-02-08 NOTE — Progress Notes (Signed)
NONSTRESS TEST INTERPRETATION  INDICATIONS: AMA, Pre-exisiting diabetes and Obesity  FHR baseline: 130-140 bpm RESULTS:Reactive COMMENTS: moderate variability, accelerations present, no decelerations noted.    PLAN: 1. Continue fetal kick counts twice a day. 2. Continue antepartum testing as scheduled-Biweekly 3. RTC in one week as scheduled   [place], [place]

## 2017-02-08 NOTE — Progress Notes (Signed)
I have reviewed the record and concur with patient management and plan.    Tamrah Victorino Michelle Caniya Tagle, CNM [facility] 

## 2017-02-08 NOTE — Patient Instructions (Signed)
Nonstress Test The nonstress test is a procedure that monitors the fetus's heartbeat. The test will monitor the heartbeat when the fetus is at rest and while the fetus is moving. In a healthy fetus, there will be an increase in fetal heart rate when the fetus moves or kicks. The heart rate will decrease at rest. This test helps determine if the fetus is healthy. Your health care provider will look at a number of patterns in the heart rate tracing to make sure your baby is thriving. If there is concern, your health care provider may order additional tests or may suggest another course of action. This test is often done in the third trimester and can help determine if an early delivery is needed and safe. Common reasons to have this test are:  You are past your due date.  You have a high-risk pregnancy.  You are feeling less movement than normal.  You have lost a pregnancy in the past.  Your health care provider suspects fetal growth problems.  You have too much or too little amniotic fluid.  What happens before the procedure?  Eat a meal right before the test or as directed by your health care provider. Food may help stimulate fetal movements.  Use the restroom right before the test. What happens during the procedure?  Two belts will be placed around your abdomen. These belts have monitors attached to them. One records the fetal heart rate and the other records uterine contractions.  You may be asked to lie down on your side or to stay sitting upright.  You may be given a button to press when you feel movement.  The fetal heartbeat is listened to and watched on a screen. The heartbeat is recorded on a sheet of paper.  If the fetus seems to be sleeping, you may be asked to drink some juice or soda, gently press your abdomen, or make some noise to wake the fetus. What happens after the procedure? Your health care provider will discuss the test results with you and make recommendations  for the near future.  This information is not intended to replace advice given to you by your health care provider. Make sure you discuss any questions you have with your health care provider. This information is not intended to replace advice given to you by your health care provider. Make sure you discuss any questions you have with your health care provider. Document Released: 06/29/2002 Document Revised: 06/08/2016 Document Reviewed: 08/12/2012 Elsevier Interactive Patient Education  2018 Elsevier Inc.  

## 2017-02-12 ENCOUNTER — Other Ambulatory Visit: Payer: BLUE CROSS/BLUE SHIELD

## 2017-02-12 ENCOUNTER — Ambulatory Visit (INDEPENDENT_AMBULATORY_CARE_PROVIDER_SITE_OTHER): Payer: BLUE CROSS/BLUE SHIELD | Admitting: Obstetrics and Gynecology

## 2017-02-12 VITALS — BP 102/69 | HR 100 | Wt 249.2 lb

## 2017-02-12 DIAGNOSIS — O24313 Unspecified pre-existing diabetes mellitus in pregnancy, third trimester: Secondary | ICD-10-CM

## 2017-02-12 DIAGNOSIS — E119 Type 2 diabetes mellitus without complications: Secondary | ICD-10-CM | POA: Diagnosis not present

## 2017-02-12 DIAGNOSIS — O09523 Supervision of elderly multigravida, third trimester: Secondary | ICD-10-CM

## 2017-02-12 LAB — POCT URINALYSIS DIPSTICK
Bilirubin, UA: NEGATIVE
Blood, UA: NEGATIVE
GLUCOSE UA: 500
KETONES UA: NEGATIVE
Leukocytes, UA: NEGATIVE
Nitrite, UA: NEGATIVE
SPEC GRAV UA: 1.025 (ref 1.010–1.025)
UROBILINOGEN UA: 0.2 U/dL
pH, UA: 6 (ref 5.0–8.0)

## 2017-02-12 NOTE — Progress Notes (Signed)
ROB:  Sugars good.  Active baby.  Pt continues to have occ pelvic pressure and pain but has learned to "deal with it."  NONSTRESS TEST INTERPRETATION  INDICATIONS:  diabetes mellitus , obesity FHR baseline: 145 RESULTS:  reactive COMMENTS:   PLAN: 1. Continue fetal kick counts as directed. 2. Continue antepartum testing as scheduled.  Finis Bud, M.D. 02/12/2017 4:53 PM

## 2017-02-15 ENCOUNTER — Other Ambulatory Visit: Payer: BLUE CROSS/BLUE SHIELD

## 2017-02-15 ENCOUNTER — Ambulatory Visit (INDEPENDENT_AMBULATORY_CARE_PROVIDER_SITE_OTHER): Payer: BLUE CROSS/BLUE SHIELD | Admitting: Obstetrics and Gynecology

## 2017-02-15 DIAGNOSIS — O24113 Pre-existing diabetes mellitus, type 2, in pregnancy, third trimester: Secondary | ICD-10-CM

## 2017-02-15 NOTE — Progress Notes (Signed)
NONSTRESS TEST INTERPRETATION  INDICATIONS: diabetes mellitus FHR baseline: 130 RESULTS:  reactive COMMENTS:   PLAN: 1. Continue fetal kick counts as directed. 2. Continue antepartum testing as scheduled.  Finis Bud, M.D. 02/18/2017 10:53 AM

## 2017-02-15 NOTE — Patient Instructions (Signed)

## 2017-02-19 ENCOUNTER — Other Ambulatory Visit: Payer: BLUE CROSS/BLUE SHIELD

## 2017-02-19 ENCOUNTER — Ambulatory Visit (INDEPENDENT_AMBULATORY_CARE_PROVIDER_SITE_OTHER): Payer: BLUE CROSS/BLUE SHIELD | Admitting: Obstetrics and Gynecology

## 2017-02-19 DIAGNOSIS — O24113 Pre-existing diabetes mellitus, type 2, in pregnancy, third trimester: Secondary | ICD-10-CM | POA: Diagnosis not present

## 2017-02-19 NOTE — Progress Notes (Signed)
NONSTRESS TEST INTERPRETATION  INDICATIONS: GDM,AMA,OBESITY  FHR baseline: 155 RESULTS:Reactive, Non-reactive and Inconclusive COMMENTS:  PLAN: 1. Continue fetal kick counts as directed. 2. Continue antepartum testing as scheduled.  Finis Bud, M.D. 02/19/2017 4:39 PM

## 2017-02-22 ENCOUNTER — Telehealth: Payer: Self-pay

## 2017-02-22 ENCOUNTER — Other Ambulatory Visit: Payer: BLUE CROSS/BLUE SHIELD

## 2017-02-22 ENCOUNTER — Encounter: Payer: BLUE CROSS/BLUE SHIELD | Admitting: Obstetrics and Gynecology

## 2017-02-26 ENCOUNTER — Ambulatory Visit (INDEPENDENT_AMBULATORY_CARE_PROVIDER_SITE_OTHER): Payer: BLUE CROSS/BLUE SHIELD | Admitting: Obstetrics and Gynecology

## 2017-02-26 ENCOUNTER — Other Ambulatory Visit: Payer: BLUE CROSS/BLUE SHIELD

## 2017-02-26 VITALS — BP 119/76 | HR 88 | Wt 250.0 lb

## 2017-02-26 DIAGNOSIS — Z98891 History of uterine scar from previous surgery: Secondary | ICD-10-CM

## 2017-02-26 DIAGNOSIS — Z113 Encounter for screening for infections with a predominantly sexual mode of transmission: Secondary | ICD-10-CM

## 2017-02-26 DIAGNOSIS — E119 Type 2 diabetes mellitus without complications: Secondary | ICD-10-CM | POA: Diagnosis not present

## 2017-02-26 DIAGNOSIS — Z3685 Encounter for antenatal screening for Streptococcus B: Secondary | ICD-10-CM | POA: Diagnosis not present

## 2017-02-26 DIAGNOSIS — D259 Leiomyoma of uterus, unspecified: Secondary | ICD-10-CM

## 2017-02-26 DIAGNOSIS — O09523 Supervision of elderly multigravida, third trimester: Secondary | ICD-10-CM

## 2017-02-26 DIAGNOSIS — O3411 Maternal care for benign tumor of corpus uteri, first trimester: Secondary | ICD-10-CM

## 2017-02-26 DIAGNOSIS — IMO0001 Reserved for inherently not codable concepts without codable children: Secondary | ICD-10-CM

## 2017-02-26 DIAGNOSIS — Z6838 Body mass index (BMI) 38.0-38.9, adult: Secondary | ICD-10-CM

## 2017-02-26 DIAGNOSIS — E669 Obesity, unspecified: Secondary | ICD-10-CM

## 2017-02-26 LAB — POCT URINALYSIS DIPSTICK
Bilirubin, UA: NEGATIVE
Blood, UA: NEGATIVE
GLUCOSE UA: 500
Ketones, UA: NEGATIVE
LEUKOCYTES UA: NEGATIVE
NITRITE UA: NEGATIVE
SPEC GRAV UA: 1.025 (ref 1.010–1.025)
Urobilinogen, UA: 0.2 E.U./dL
pH, UA: 6.5 (ref 5.0–8.0)

## 2017-02-26 LAB — OB RESULTS CONSOLE GBS: GBS: NEGATIVE

## 2017-02-26 NOTE — Progress Notes (Signed)
Patient notes some elevated BPs at home (highest has been 180s/70s).  Notes occasional headaches, but usually self resolve. Denies vision changes, RUQ pain. BP wnl today.  Advised that if she continues to note elevated BPs at home, she should bring her cuff to ensure accurate readings. Fastings consistently 130s-140s over the past 2 weeks.  Last week increased to 1 full tablet (from 1 tablet).  2 hr postprandials now up to 160s-170s.  Increased to 1 full tablet in evening. Advised on taking 1.5 tablet in a.m. 1 tablet in p.m. And avoid the midnight snack. 36 week labs done today. RTC in 1 week.  Continue twice weekly NSTs, for growth scan next visit.

## 2017-03-01 ENCOUNTER — Ambulatory Visit (INDEPENDENT_AMBULATORY_CARE_PROVIDER_SITE_OTHER): Payer: BLUE CROSS/BLUE SHIELD | Admitting: Obstetrics and Gynecology

## 2017-03-01 ENCOUNTER — Other Ambulatory Visit: Payer: BLUE CROSS/BLUE SHIELD

## 2017-03-01 VITALS — BP 122/72 | HR 83 | Wt 256.5 lb

## 2017-03-01 DIAGNOSIS — O24113 Pre-existing diabetes mellitus, type 2, in pregnancy, third trimester: Secondary | ICD-10-CM | POA: Diagnosis not present

## 2017-03-01 DIAGNOSIS — O09523 Supervision of elderly multigravida, third trimester: Secondary | ICD-10-CM

## 2017-03-01 DIAGNOSIS — Z98891 History of uterine scar from previous surgery: Secondary | ICD-10-CM

## 2017-03-01 DIAGNOSIS — Z6838 Body mass index (BMI) 38.0-38.9, adult: Secondary | ICD-10-CM

## 2017-03-01 DIAGNOSIS — E669 Obesity, unspecified: Secondary | ICD-10-CM

## 2017-03-01 DIAGNOSIS — IMO0001 Reserved for inherently not codable concepts without codable children: Secondary | ICD-10-CM

## 2017-03-01 LAB — POCT URINALYSIS DIPSTICK
Bilirubin, UA: NEGATIVE
Glucose, UA: NEGATIVE
Ketones, UA: NEGATIVE
Nitrite, UA: NEGATIVE
SPEC GRAV UA: 1.02 (ref 1.010–1.025)
UROBILINOGEN UA: 0.2 U/dL
pH, UA: 6 (ref 5.0–8.0)

## 2017-03-01 LAB — GC/CHLAMYDIA PROBE AMP
Chlamydia trachomatis, NAA: NEGATIVE
Neisseria gonorrhoeae by PCR: NEGATIVE

## 2017-03-01 NOTE — Progress Notes (Signed)
NONSTRESS TEST INTERPRETATION  INDICATIONS: Pre-existing diabetes, Obesity and  AMA  FHR baseline: 140 bpm RESULTS:Reactive COMMENTS: Uterine irritability, 1 contraction noted.    PLAN: 1. Continue fetal kick counts twice a day. 2. Continue antepartum testing as scheduled-Biweekly

## 2017-03-03 LAB — CULTURE, BETA STREP (GROUP B ONLY): STREP GP B CULTURE: NEGATIVE

## 2017-03-05 ENCOUNTER — Encounter: Payer: BLUE CROSS/BLUE SHIELD | Admitting: Obstetrics and Gynecology

## 2017-03-05 ENCOUNTER — Other Ambulatory Visit: Payer: Self-pay | Admitting: Obstetrics and Gynecology

## 2017-03-05 ENCOUNTER — Ambulatory Visit (INDEPENDENT_AMBULATORY_CARE_PROVIDER_SITE_OTHER): Payer: BLUE CROSS/BLUE SHIELD | Admitting: Obstetrics and Gynecology

## 2017-03-05 ENCOUNTER — Encounter: Payer: Self-pay | Admitting: Obstetrics and Gynecology

## 2017-03-05 ENCOUNTER — Other Ambulatory Visit: Payer: BLUE CROSS/BLUE SHIELD

## 2017-03-05 ENCOUNTER — Ambulatory Visit (INDEPENDENT_AMBULATORY_CARE_PROVIDER_SITE_OTHER): Payer: BLUE CROSS/BLUE SHIELD

## 2017-03-05 VITALS — BP 115/73 | HR 97 | Wt 257.2 lb

## 2017-03-05 DIAGNOSIS — O0993 Supervision of high risk pregnancy, unspecified, third trimester: Secondary | ICD-10-CM | POA: Diagnosis not present

## 2017-03-05 DIAGNOSIS — E669 Obesity, unspecified: Secondary | ICD-10-CM

## 2017-03-05 DIAGNOSIS — IMO0001 Reserved for inherently not codable concepts without codable children: Secondary | ICD-10-CM

## 2017-03-05 DIAGNOSIS — O99213 Obesity complicating pregnancy, third trimester: Secondary | ICD-10-CM

## 2017-03-05 DIAGNOSIS — Z6838 Body mass index (BMI) 38.0-38.9, adult: Secondary | ICD-10-CM

## 2017-03-05 DIAGNOSIS — O24013 Pre-existing diabetes mellitus, type 1, in pregnancy, third trimester: Secondary | ICD-10-CM

## 2017-03-05 DIAGNOSIS — O09523 Supervision of elderly multigravida, third trimester: Secondary | ICD-10-CM

## 2017-03-05 DIAGNOSIS — Z98891 History of uterine scar from previous surgery: Secondary | ICD-10-CM

## 2017-03-05 DIAGNOSIS — O24113 Pre-existing diabetes mellitus, type 2, in pregnancy, third trimester: Secondary | ICD-10-CM

## 2017-03-05 LAB — POCT URINALYSIS DIPSTICK
BILIRUBIN UA: NEGATIVE
Ketones, UA: NEGATIVE
Leukocytes, UA: NEGATIVE
Nitrite, UA: NEGATIVE
Protein, UA: NEGATIVE
RBC UA: NEGATIVE
Spec Grav, UA: 1.015 (ref 1.010–1.025)
Urobilinogen, UA: 0.2 E.U./dL
pH, UA: 6.5 (ref 5.0–8.0)

## 2017-03-05 NOTE — Progress Notes (Signed)
ROB and BPP today.  BS- high per pt.

## 2017-03-05 NOTE — Progress Notes (Addendum)
ROB: Patient notes that fasting blood sugars are still elevated (120-137) despite increasing to 7.5 mg Glyburide (still in 130s).  Notes that her blood sugars drop ~ 4 hours after taking the meds (60s-70s) where she feels jittery and sweating, and so she eats a small snack, which likely increases her fasting levels. Patient notes she takes before dinner (eats around 5:30 pm) and goes to bed around 8. Advised on either replacing snacks with glucose tablets, or eating a small snack prior to her bedtime to prevent the middle of the night lows.  Also noting mid day elevations with 2 hr pp breakfast and lunch (up to 140s). Can increase morning dose to 7.5 mg. Recent growth scan noting growth at 80%ile, normal AFI, normal BPP.  Will continue scheduling C-section for 03/20/17.  Continue antenatal testing. RTC in 1 week.

## 2017-03-07 ENCOUNTER — Telehealth: Payer: Self-pay | Admitting: Obstetrics and Gynecology

## 2017-03-07 NOTE — Telephone Encounter (Signed)
Patient called and would like to speak with a nurse or Dr. Marcelline Mates in regards to her results from her lab work, The patient has questions and concerns in regards to the results on MyChart suggesting that she may have an "infection" The patient did not disclose any other information other than wanting a call back . Please advise.

## 2017-03-07 NOTE — Telephone Encounter (Signed)
Spoke with pt and reassured her the most recent urine dip was negative for leukocytes. No other abnormal values present.

## 2017-03-08 ENCOUNTER — Ambulatory Visit (INDEPENDENT_AMBULATORY_CARE_PROVIDER_SITE_OTHER): Payer: BLUE CROSS/BLUE SHIELD | Admitting: Obstetrics and Gynecology

## 2017-03-08 ENCOUNTER — Other Ambulatory Visit: Payer: BLUE CROSS/BLUE SHIELD

## 2017-03-08 VITALS — BP 119/69 | HR 83 | Wt 258.1 lb

## 2017-03-08 DIAGNOSIS — O09523 Supervision of elderly multigravida, third trimester: Secondary | ICD-10-CM

## 2017-03-08 DIAGNOSIS — O24113 Pre-existing diabetes mellitus, type 2, in pregnancy, third trimester: Secondary | ICD-10-CM | POA: Diagnosis not present

## 2017-03-08 NOTE — Progress Notes (Signed)
NONSTRESS TEST INTERPRETATION  INDICATIONS: Preexisting diabetes,Obesity,AMA  FHR baseline: 140bpm RESULTS:reactive COMMENTS:   PLAN: 1. Continue fetal kick counts twice a day. 2. Continue antepartum testing as scheduled-Biweekly

## 2017-03-12 ENCOUNTER — Other Ambulatory Visit: Payer: BLUE CROSS/BLUE SHIELD

## 2017-03-12 ENCOUNTER — Ambulatory Visit (INDEPENDENT_AMBULATORY_CARE_PROVIDER_SITE_OTHER): Payer: BLUE CROSS/BLUE SHIELD | Admitting: Obstetrics and Gynecology

## 2017-03-12 ENCOUNTER — Encounter: Payer: BLUE CROSS/BLUE SHIELD | Admitting: Obstetrics and Gynecology

## 2017-03-12 VITALS — BP 130/77 | HR 88 | Wt 261.2 lb

## 2017-03-12 DIAGNOSIS — O09523 Supervision of elderly multigravida, third trimester: Secondary | ICD-10-CM | POA: Diagnosis not present

## 2017-03-12 DIAGNOSIS — Z98891 History of uterine scar from previous surgery: Secondary | ICD-10-CM

## 2017-03-12 DIAGNOSIS — IMO0001 Reserved for inherently not codable concepts without codable children: Secondary | ICD-10-CM

## 2017-03-12 DIAGNOSIS — E669 Obesity, unspecified: Secondary | ICD-10-CM

## 2017-03-12 DIAGNOSIS — Z6838 Body mass index (BMI) 38.0-38.9, adult: Secondary | ICD-10-CM

## 2017-03-12 DIAGNOSIS — E119 Type 2 diabetes mellitus without complications: Secondary | ICD-10-CM

## 2017-03-12 LAB — POCT URINALYSIS DIPSTICK
Bilirubin, UA: NEGATIVE
Glucose, UA: NEGATIVE
Ketones, UA: NEGATIVE
LEUKOCYTES UA: NEGATIVE
NITRITE UA: NEGATIVE
PH UA: 6.5 (ref 5.0–8.0)
RBC UA: NEGATIVE
Spec Grav, UA: 1.02 (ref 1.010–1.025)
UROBILINOGEN UA: 0.2 U/dL

## 2017-03-12 MED ORDER — FUROSEMIDE 20 MG PO TABS: 20.0000 mg | ORAL_TABLET | Freq: Every day | ORAL | 0 refills | Status: DC

## 2017-03-12 NOTE — Progress Notes (Signed)
NONSTRESS TEST INTERPRETATION  INDICATIONS: Pre-Existing Diabetes and  AMA  FHR baseline: 150bpm RESULTS:Reactive COMMENTS: None   PLAN: 1. Continue fetal kick counts twice a day. 2. Continue antepartum testing as scheduled-Biweekly  Gordy Clement, [place]

## 2017-03-12 NOTE — Progress Notes (Signed)
ROB: C/o numbness and burning pain of toes and legs.  Likely secondary to edema (+2 pitting).  Also notes intermittent episodes of heart palpitations. Unsure cause, heart rate normal today. Discussed that if it continues after the pregnancy, will refer to Cardiology. Also notes that she has this overall sense of "not feeling well".  Notes blood sugars are in 200s (including fastings).  Had advised patient to d/c her glyburide closer to time of C-section due to issues with fetal blood sugars being noted by neonatologist, however patient's blood sugars are not such that she should be off. Recommended to resume her meds up until the day of her scheduled surgery. Also given a prescription for 1 time dose of Lasix to relieve tension and pressure from edema.  NST performed today was reviewed and was found to be reactive.  Continue recommended antenatal testing and prenatal care.  RTC in 1 week.  Marland Kitchen

## 2017-03-15 ENCOUNTER — Other Ambulatory Visit: Payer: BLUE CROSS/BLUE SHIELD

## 2017-03-15 ENCOUNTER — Ambulatory Visit (INDEPENDENT_AMBULATORY_CARE_PROVIDER_SITE_OTHER): Payer: BLUE CROSS/BLUE SHIELD | Admitting: Obstetrics and Gynecology

## 2017-03-15 VITALS — BP 126/77 | HR 86 | Wt 265.9 lb

## 2017-03-15 DIAGNOSIS — O09523 Supervision of elderly multigravida, third trimester: Secondary | ICD-10-CM | POA: Diagnosis not present

## 2017-03-15 DIAGNOSIS — E669 Obesity, unspecified: Secondary | ICD-10-CM

## 2017-03-15 DIAGNOSIS — IMO0001 Reserved for inherently not codable concepts without codable children: Secondary | ICD-10-CM

## 2017-03-15 DIAGNOSIS — E119 Type 2 diabetes mellitus without complications: Secondary | ICD-10-CM

## 2017-03-15 DIAGNOSIS — Z6838 Body mass index (BMI) 38.0-38.9, adult: Secondary | ICD-10-CM

## 2017-03-15 LAB — POCT URINALYSIS DIPSTICK
BILIRUBIN UA: NEGATIVE
Blood, UA: NEGATIVE
KETONES UA: NEGATIVE
LEUKOCYTES UA: NEGATIVE
Nitrite, UA: NEGATIVE
Protein, UA: NEGATIVE
SPEC GRAV UA: 1.02 (ref 1.010–1.025)
Urobilinogen, UA: 0.2 E.U./dL
pH, UA: 6.5 (ref 5.0–8.0)

## 2017-03-15 NOTE — Progress Notes (Signed)
NONSTRESS TEST INTERPRETATION  INDICATIONS: Gestational Diabetes, Obesity     FHR baseline: 150-160 RESULTS: reactive COMMENTS: B/P range: 121/71 to 126/78   PLAN: 1. Continue fetal kick counts twice a day. 2. Continue antepartum testing as scheduled-Biweekly 3. Return to office as scheduled.  Martie Lee, LPN

## 2017-03-15 NOTE — Progress Notes (Signed)
Problem OB: Patient notes that she restarted her Glyburide as instructed, and is starting to feel a little better.  Patient notes that she stopped checking her blood sugars as it was causing her a lot of anxiety and emotional distress. Still noting stinging pains in lower abdomen (very tender to touch), and in legs.  Took Lasix tablets which did help, however the swelling has returned.  Has tried using belly band and baths, which irritate the skin of her abdomen. Discussed use of lidocaine gel or patch on abdomen, as needed for intense pain, and sparingly. Is scheduled for C-section on Wednesday. RTC next week for final visit. NST performed today was reviewed and was found to be reactive.  Continue recommended antenatal testing and prenatal care.

## 2017-03-19 ENCOUNTER — Encounter: Payer: BLUE CROSS/BLUE SHIELD | Admitting: Obstetrics and Gynecology

## 2017-03-19 ENCOUNTER — Ambulatory Visit
Admission: RE | Admit: 2017-03-19 | Discharge: 2017-03-19 | Disposition: A | Discharge status: Home / Self Care | Payer: BLUE CROSS/BLUE SHIELD | Source: Ambulatory Visit | Attending: Obstetrics and Gynecology | Admitting: Obstetrics and Gynecology

## 2017-03-19 ENCOUNTER — Other Ambulatory Visit: Payer: BLUE CROSS/BLUE SHIELD

## 2017-03-19 DIAGNOSIS — Z3A00 Weeks of gestation of pregnancy not specified: Secondary | ICD-10-CM

## 2017-03-19 DIAGNOSIS — E119 Type 2 diabetes mellitus without complications: Secondary | ICD-10-CM | POA: Diagnosis not present

## 2017-03-19 DIAGNOSIS — Z01812 Encounter for preprocedural laboratory examination: Secondary | ICD-10-CM | POA: Insufficient documentation

## 2017-03-19 DIAGNOSIS — O34211 Maternal care for low transverse scar from previous cesarean delivery: Secondary | ICD-10-CM | POA: Diagnosis not present

## 2017-03-19 DIAGNOSIS — O3413 Maternal care for benign tumor of corpus uteri, third trimester: Secondary | ICD-10-CM | POA: Diagnosis not present

## 2017-03-19 DIAGNOSIS — O34219 Maternal care for unspecified type scar from previous cesarean delivery: Secondary | ICD-10-CM

## 2017-03-19 DIAGNOSIS — Z3A38 38 weeks gestation of pregnancy: Secondary | ICD-10-CM | POA: Diagnosis not present

## 2017-03-19 DIAGNOSIS — Z6841 Body Mass Index (BMI) 40.0 and over, adult: Secondary | ICD-10-CM | POA: Diagnosis not present

## 2017-03-19 DIAGNOSIS — Z0183 Encounter for blood typing: Secondary | ICD-10-CM

## 2017-03-19 DIAGNOSIS — Z7984 Long term (current) use of oral hypoglycemic drugs: Secondary | ICD-10-CM | POA: Diagnosis not present

## 2017-03-19 DIAGNOSIS — O1405 Mild to moderate pre-eclampsia, complicating the puerperium: Secondary | ICD-10-CM | POA: Diagnosis not present

## 2017-03-19 DIAGNOSIS — D251 Intramural leiomyoma of uterus: Secondary | ICD-10-CM | POA: Diagnosis not present

## 2017-03-19 DIAGNOSIS — D252 Subserosal leiomyoma of uterus: Secondary | ICD-10-CM | POA: Diagnosis not present

## 2017-03-19 DIAGNOSIS — Z302 Encounter for sterilization: Secondary | ICD-10-CM | POA: Diagnosis not present

## 2017-03-19 DIAGNOSIS — O2412 Pre-existing diabetes mellitus, type 2, in childbirth: Secondary | ICD-10-CM | POA: Diagnosis not present

## 2017-03-19 DIAGNOSIS — O99214 Obesity complicating childbirth: Secondary | ICD-10-CM | POA: Diagnosis not present

## 2017-03-19 DIAGNOSIS — E669 Obesity, unspecified: Secondary | ICD-10-CM | POA: Diagnosis not present

## 2017-03-19 LAB — TYPE AND SCREEN
ABO/RH(D): O POS
ANTIBODY SCREEN: NEGATIVE
EXTEND SAMPLE REASON: UNDETERMINED

## 2017-03-19 LAB — RAPID HIV SCREEN (HIV 1/2 AB+AG)
HIV 1/2 ANTIBODIES: NONREACTIVE
HIV-1 P24 Antigen - HIV24: NONREACTIVE

## 2017-03-19 LAB — CBC
HEMATOCRIT: 35.6 % (ref 35.0–47.0)
Hemoglobin: 11.9 g/dL — ABNORMAL LOW (ref 12.0–16.0)
MCH: 27.2 pg (ref 26.0–34.0)
MCHC: 33.3 g/dL (ref 32.0–36.0)
MCV: 81.8 fL (ref 80.0–100.0)
Platelets: 192 10*3/uL (ref 150–440)
RBC: 4.36 MIL/uL (ref 3.80–5.20)
RDW: 15 % — AB (ref 11.5–14.5)
WBC: 6.6 10*3/uL (ref 3.6–11.0)

## 2017-03-19 NOTE — Patient Instructions (Signed)
Your procedure is scheduled on: Tomorrow Report to [facility] AT 11:45   Remember: Instructions that are not followed completely may result in serious medical risk, up to and including death, or upon the discretion of your surgeon and anesthesiologist your surgery may need to be rescheduled.    __X__ 1. Do not eat anything after midnight the night before your    procedure.  No gum chewing or hard candies.  You may drink clear   liquids up to 2 hours before you are scheduled to arrive at the   hospital for your procedure. Do not drink clear liquids within 2   hours of scheduled arrival to the hospital as this may lead to your   procedure being delayed or rescheduled.       Clear liquids include:   Water or Apple juice without pulp   Clear carbohydrate beverage such as Clearfast or Gatorade   Black coffee or Clear Tea 9no milk, no creamer, do not add anything   to the coffee or tea)    Diabetics should only drink water   __X__ 2. No Alcohol for 24 hours before or after surgery.   ____ 3. Bring all medications with you on the day of surgery if instructed.    __X__ 4. Notify your doctor if there is any change in your medical condition     (cold, fever, infections).             _____5. No smoking within 24 hours of your surgery.     Do not wear jewelry, make-up, hairpins, clips or nail polish.  Do not wear lotions, powders, or perfumes.   Do not shave 48 hours prior to surgery. Men may shave face and neck.  Do not bring valuables to the hospital.    [facility] is not responsible for any belongings or valuables.               Contacts, dentures or bridgework may not be worn into surgery.  Leave your suitcase in the car. After surgery it may be brought to your room.  For patients admitted to the hospital, discharge time is determined by your                treatment team.   Patients discharged the day of surgery will not be allowed to drive home.   Please read over the following  fact sheets that you were given:   MRSA Information   ____ Take these medicines the morning of surgery with A SIP OF WATER:    1. NONE  2.   3.   4.  5.  6.  ____ Fleet Enema (as directed)   __X__ Use CHG Soap as directed  ____ Use inhalers on the day of surgery  ____ Stop metformin 2 days prior to surgery    ____ Take 1/2 of usual insulin dose the night before surgery and none on the morning of surgery.   ____ Stop Coumadin/Plavix/aspirin on   __X__ Stop Anti-inflammatories such as Advil, Aleve, Ibuprofen, Motrin, Naproxen, Naprosyn, Goodies,powder, or aspirin products.  OK to take Tylenol.   ____ Stop supplements until after surgery.    ____ Bring C-Pap to the hospital.

## 2017-03-20 ENCOUNTER — Inpatient Hospital Stay: Payer: BLUE CROSS/BLUE SHIELD | Admitting: Anesthesiology

## 2017-03-20 ENCOUNTER — Encounter: Payer: Self-pay | Admitting: Anesthesiology

## 2017-03-20 ENCOUNTER — Inpatient Hospital Stay
Admission: RE | Admit: 2017-03-20 | Discharge: 2017-03-23 | DRG: 765 | Disposition: A | Discharge status: Home / Self Care | Payer: BLUE CROSS/BLUE SHIELD | Source: Ambulatory Visit | Attending: Obstetrics and Gynecology | Admitting: Obstetrics and Gynecology

## 2017-03-20 ENCOUNTER — Encounter
Admission: RE | Disposition: A | Discharge status: Home / Self Care | Payer: Self-pay | Source: Ambulatory Visit | Attending: Obstetrics and Gynecology

## 2017-03-20 DIAGNOSIS — E669 Obesity, unspecified: Secondary | ICD-10-CM | POA: Diagnosis present

## 2017-03-20 DIAGNOSIS — O34219 Maternal care for unspecified type scar from previous cesarean delivery: Secondary | ICD-10-CM | POA: Diagnosis not present

## 2017-03-20 DIAGNOSIS — O2412 Pre-existing diabetes mellitus, type 2, in childbirth: Secondary | ICD-10-CM | POA: Diagnosis present

## 2017-03-20 DIAGNOSIS — D251 Intramural leiomyoma of uterus: Secondary | ICD-10-CM | POA: Diagnosis present

## 2017-03-20 DIAGNOSIS — O1405 Mild to moderate pre-eclampsia, complicating the puerperium: Secondary | ICD-10-CM | POA: Diagnosis present

## 2017-03-20 DIAGNOSIS — Z6841 Body Mass Index (BMI) 40.0 and over, adult: Secondary | ICD-10-CM | POA: Diagnosis not present

## 2017-03-20 DIAGNOSIS — D252 Subserosal leiomyoma of uterus: Secondary | ICD-10-CM | POA: Diagnosis present

## 2017-03-20 DIAGNOSIS — O99214 Obesity complicating childbirth: Secondary | ICD-10-CM | POA: Diagnosis present

## 2017-03-20 DIAGNOSIS — O34211 Maternal care for low transverse scar from previous cesarean delivery: Principal | ICD-10-CM | POA: Diagnosis present

## 2017-03-20 DIAGNOSIS — O09529 Supervision of elderly multigravida, unspecified trimester: Secondary | ICD-10-CM

## 2017-03-20 DIAGNOSIS — E119 Type 2 diabetes mellitus without complications: Secondary | ICD-10-CM | POA: Diagnosis not present

## 2017-03-20 DIAGNOSIS — Z7984 Long term (current) use of oral hypoglycemic drugs: Secondary | ICD-10-CM | POA: Diagnosis not present

## 2017-03-20 DIAGNOSIS — O3411 Maternal care for benign tumor of corpus uteri, first trimester: Secondary | ICD-10-CM

## 2017-03-20 DIAGNOSIS — Z3A38 38 weeks gestation of pregnancy: Secondary | ICD-10-CM

## 2017-03-20 DIAGNOSIS — Z302 Encounter for sterilization: Secondary | ICD-10-CM

## 2017-03-20 DIAGNOSIS — O3413 Maternal care for benign tumor of corpus uteri, third trimester: Secondary | ICD-10-CM | POA: Diagnosis present

## 2017-03-20 DIAGNOSIS — Z98891 History of uterine scar from previous surgery: Secondary | ICD-10-CM

## 2017-03-20 DIAGNOSIS — D259 Leiomyoma of uterus, unspecified: Secondary | ICD-10-CM

## 2017-03-20 LAB — COMPREHENSIVE METABOLIC PANEL
ALBUMIN: 2.4 g/dL — AB (ref 3.5–5.0)
ALK PHOS: 117 U/L (ref 38–126)
ALT: 19 U/L (ref 14–54)
ANION GAP: 9 (ref 5–15)
AST: 24 U/L (ref 15–41)
BUN: 5 mg/dL — ABNORMAL LOW (ref 6–20)
CALCIUM: 8.3 mg/dL — AB (ref 8.9–10.3)
CHLORIDE: 107 mmol/L (ref 101–111)
CO2: 19 mmol/L — AB (ref 22–32)
Creatinine, Ser: 0.57 mg/dL (ref 0.44–1.00)
GFR calc non Af Amer: >60 mL/min (ref >60)
GLUCOSE: 111 mg/dL — AB (ref 65–99)
Potassium: 3.9 mmol/L (ref 3.5–5.1)
SODIUM: 135 mmol/L (ref 135–145)
Total Bilirubin: 0.5 mg/dL (ref 0.3–1.2)
Total Protein: 5.6 g/dL — ABNORMAL LOW (ref 6.5–8.1)

## 2017-03-20 LAB — RPR: RPR: NONREACTIVE

## 2017-03-20 LAB — CBC
HEMATOCRIT: 33.9 % — AB (ref 35.0–47.0)
HEMOGLOBIN: 11.4 g/dL — AB (ref 12.0–16.0)
MCH: 27.1 pg (ref 26.0–34.0)
MCHC: 33.5 g/dL (ref 32.0–36.0)
MCV: 80.8 fL (ref 80.0–100.0)
Platelets: 201 10*3/uL (ref 150–440)
RBC: 4.2 MIL/uL (ref 3.80–5.20)
RDW: 14.9 % — ABNORMAL HIGH (ref 11.5–14.5)
WBC: 8 10*3/uL (ref 3.6–11.0)

## 2017-03-20 LAB — PROTEIN / CREATININE RATIO, URINE
Creatinine, Urine: 18 mg/dL
PROTEIN CREATININE RATIO: 0.89 mg/mg{creat} — AB (ref 0.00–0.15)
TOTAL PROTEIN, URINE: 16 mg/dL

## 2017-03-20 LAB — GLUCOSE, CAPILLARY: Glucose-Capillary: 104 mg/dL — ABNORMAL HIGH (ref 65–99)

## 2017-03-20 LAB — ABO/RH: ABO/RH(D): O POS

## 2017-03-20 SURGERY — Surgical Case
Anesthesia: Spinal | Site: Abdomen | Wound class: Clean

## 2017-03-20 MED ORDER — SODIUM CHLORIDE 0.9 % IV SOLN
INTRAVENOUS | Status: DC | PRN
  Administered 2017-03-20: 50 ug/min via INTRAVENOUS

## 2017-03-20 MED ORDER — OXYCODONE HCL 5 MG PO TABS: 5.0000 mg | ORAL_TABLET | Freq: Four times a day (QID) | ORAL | Status: DC | PRN

## 2017-03-20 MED ORDER — LACTATED RINGERS IV SOLN
Freq: Once | INTRAVENOUS | Status: AC
  Administered 2017-03-20: 12:00:00 via INTRAVENOUS

## 2017-03-20 MED ORDER — LACTATED RINGERS IV SOLN
INTRAVENOUS | Status: DC
  Administered 2017-03-20 (×2): via INTRAVENOUS

## 2017-03-20 MED ORDER — FERROUS SULFATE 325 (65 FE) MG PO TABS
325.0000 mg | ORAL_TABLET | Freq: Two times a day (BID) | ORAL | Status: DC
  Administered 2017-03-21 – 2017-03-23 (×5): 325 mg via ORAL
  Filled 2017-03-20 (×5): qty 1

## 2017-03-20 MED ORDER — OXYCODONE-ACETAMINOPHEN 5-325 MG PO TABS
1.0000 | ORAL_TABLET | ORAL | Status: DC | PRN
  Administered 2017-03-21 – 2017-03-23 (×4): 1 via ORAL
  Filled 2017-03-20 (×4): qty 1

## 2017-03-20 MED ORDER — DIPHENHYDRAMINE HCL 25 MG PO CAPS: 25.0000 mg | ORAL_CAPSULE | Freq: Four times a day (QID) | ORAL | Status: DC | PRN

## 2017-03-20 MED ORDER — KETOROLAC TROMETHAMINE 30 MG/ML IJ SOLN
INTRAMUSCULAR | Status: AC
  Administered 2017-03-20: 30 mg via INTRAVENOUS
  Filled 2017-03-20: qty 1

## 2017-03-20 MED ORDER — SENNOSIDES-DOCUSATE SODIUM 8.6-50 MG PO TABS
2.0000 | ORAL_TABLET | ORAL | Status: DC
  Administered 2017-03-20 – 2017-03-23 (×3): 2 via ORAL
  Filled 2017-03-20 (×3): qty 2

## 2017-03-20 MED ORDER — ONDANSETRON HCL 4 MG/2ML IJ SOLN: 4.0000 mg | Freq: Three times a day (TID) | INTRAMUSCULAR | Status: DC | PRN

## 2017-03-20 MED ORDER — NALOXONE HCL 0.4 MG/ML IJ SOLN: 0.4000 mg | INTRAMUSCULAR | Status: DC | PRN

## 2017-03-20 MED ORDER — SODIUM CHLORIDE 0.9% FLUSH: 3.0000 mL | INTRAVENOUS | Status: DC | PRN

## 2017-03-20 MED ORDER — COCONUT OIL OIL: 1.0000 "application " | TOPICAL_OIL | Status: DC | PRN

## 2017-03-20 MED ORDER — FENTANYL CITRATE (PF) 100 MCG/2ML IJ SOLN
INTRAMUSCULAR | Status: AC
  Filled 2017-03-20: qty 2

## 2017-03-20 MED ORDER — ONDANSETRON HCL 4 MG/2ML IJ SOLN
INTRAMUSCULAR | Status: AC
  Filled 2017-03-20: qty 4

## 2017-03-20 MED ORDER — BUPIVACAINE IN DEXTROSE 0.75-8.25 % IT SOLN
INTRATHECAL | Status: DC | PRN
  Administered 2017-03-20: 1.8 mL via INTRATHECAL

## 2017-03-20 MED ORDER — LIDOCAINE 5 % EX PTCH
1.0000 | MEDICATED_PATCH | CUTANEOUS | Status: DC
Start: 2017-03-20 — End: 2017-03-20
  Filled 2017-03-20: qty 1

## 2017-03-20 MED ORDER — NALBUPHINE HCL 10 MG/ML IJ SOLN: 5.0000 mg | Freq: Once | INTRAMUSCULAR | Status: DC | PRN

## 2017-03-20 MED ORDER — PRENATAL MULTIVITAMIN CH
1.0000 | ORAL_TABLET | Freq: Every day | ORAL | Status: DC
  Administered 2017-03-21 – 2017-03-22 (×2): 1 via ORAL
  Filled 2017-03-20 (×3): qty 1

## 2017-03-20 MED ORDER — OXYCODONE HCL 5 MG/5ML PO SOLN
5.0000 mg | Freq: Once | ORAL | Status: DC | PRN
  Filled 2017-03-20: qty 5

## 2017-03-20 MED ORDER — DIPHENHYDRAMINE HCL 25 MG PO CAPS
25.0000 mg | ORAL_CAPSULE | ORAL | Status: DC | PRN
  Administered 2017-03-22 – 2017-03-23 (×6): 25 mg via ORAL
  Filled 2017-03-20 (×6): qty 1

## 2017-03-20 MED ORDER — SOD CITRATE-CITRIC ACID 500-334 MG/5ML PO SOLN
ORAL | Status: AC
  Administered 2017-03-20: 30 mL
  Filled 2017-03-20: qty 15

## 2017-03-20 MED ORDER — LABETALOL HCL 5 MG/ML IV SOLN
20.0000 mg | INTRAVENOUS | Status: DC | PRN
  Administered 2017-03-20: 20 mg via INTRAVENOUS
  Filled 2017-03-20: qty 16

## 2017-03-20 MED ORDER — DIPHENHYDRAMINE HCL 50 MG/ML IJ SOLN: 12.5000 mg | INTRAMUSCULAR | Status: DC | PRN

## 2017-03-20 MED ORDER — SIMETHICONE 80 MG PO CHEW
80.0000 mg | CHEWABLE_TABLET | ORAL | Status: DC | PRN
  Administered 2017-03-22: 80 mg via ORAL
  Filled 2017-03-20: qty 1

## 2017-03-20 MED ORDER — OXYCODONE HCL 5 MG PO TABS: 5.0000 mg | ORAL_TABLET | Freq: Once | ORAL | Status: DC | PRN

## 2017-03-20 MED ORDER — MEPERIDINE HCL 25 MG/ML IJ SOLN: 6.2500 mg | INTRAMUSCULAR | Status: DC | PRN

## 2017-03-20 MED ORDER — LACTATED RINGERS IV SOLN
INTRAVENOUS | Status: DC
Start: 2017-03-20 — End: 2017-03-21
  Administered 2017-03-21: 13:00:00 via INTRAVENOUS

## 2017-03-20 MED ORDER — IBUPROFEN 600 MG PO TABS
600.0000 mg | ORAL_TABLET | Freq: Four times a day (QID) | ORAL | Status: DC
  Administered 2017-03-21 (×2): 600 mg via ORAL
  Filled 2017-03-20 (×2): qty 1

## 2017-03-20 MED ORDER — KETOROLAC TROMETHAMINE 30 MG/ML IJ SOLN: 30.0000 mg | Freq: Four times a day (QID) | INTRAMUSCULAR | Status: DC | PRN

## 2017-03-20 MED ORDER — HYDRALAZINE HCL 20 MG/ML IJ SOLN
10.0000 mg | Freq: Once | INTRAMUSCULAR | Status: DC | PRN
  Filled 2017-03-20: qty 0.5

## 2017-03-20 MED ORDER — KETOROLAC TROMETHAMINE 30 MG/ML IJ SOLN
30.0000 mg | Freq: Four times a day (QID) | INTRAMUSCULAR | Status: DC | PRN
  Administered 2017-03-20 – 2017-03-21 (×3): 30 mg via INTRAVENOUS
  Filled 2017-03-20 (×2): qty 1

## 2017-03-20 MED ORDER — ONDANSETRON HCL 4 MG/2ML IJ SOLN
INTRAMUSCULAR | Status: DC | PRN
  Administered 2017-03-20: 8 mg via INTRAVENOUS

## 2017-03-20 MED ORDER — OXYTOCIN 10 UNIT/ML IJ SOLN
INTRAMUSCULAR | Status: AC
  Filled 2017-03-20: qty 4

## 2017-03-20 MED ORDER — ACETAMINOPHEN 325 MG PO TABS: 650.0000 mg | ORAL_TABLET | ORAL | Status: DC | PRN

## 2017-03-20 MED ORDER — OXYTOCIN 40 UNITS IN LACTATED RINGERS INFUSION - SIMPLE MED
2.5000 [IU]/h | INTRAVENOUS | Status: DC
  Administered 2017-03-20: 2.5 [IU]/h via INTRAVENOUS
  Filled 2017-03-20: qty 1000

## 2017-03-20 MED ORDER — LIDOCAINE 5 % EX PTCH
MEDICATED_PATCH | CUTANEOUS | Status: DC | PRN
  Administered 2017-03-20: 1 via TRANSDERMAL

## 2017-03-20 MED ORDER — LABETALOL HCL 5 MG/ML IV SOLN
INTRAVENOUS | Status: AC
  Administered 2017-03-20: 20 mg via INTRAVENOUS
  Filled 2017-03-20: qty 4

## 2017-03-20 MED ORDER — LIDOCAINE 5 % EX PTCH
1.0000 | MEDICATED_PATCH | CUTANEOUS | Status: DC
  Administered 2017-03-21 – 2017-03-22 (×2): 1 via TRANSDERMAL
  Filled 2017-03-20 (×2): qty 1

## 2017-03-20 MED ORDER — ACETAMINOPHEN 500 MG PO TABS
1000.0000 mg | ORAL_TABLET | Freq: Four times a day (QID) | ORAL | Status: DC
  Administered 2017-03-21: 1000 mg via ORAL
  Filled 2017-03-20: qty 2

## 2017-03-20 MED ORDER — FENTANYL CITRATE (PF) 100 MCG/2ML IJ SOLN
INTRAMUSCULAR | Status: DC | PRN
  Administered 2017-03-20: 25 ug via INTRATHECAL

## 2017-03-20 MED ORDER — MAGNESIUM HYDROXIDE 400 MG/5ML PO SUSP
30.0000 mL | ORAL | Status: DC | PRN
  Filled 2017-03-20: qty 30

## 2017-03-20 MED ORDER — WITCH HAZEL-GLYCERIN EX PADS: 1.0000 "application " | MEDICATED_PAD | CUTANEOUS | Status: DC | PRN

## 2017-03-20 MED ORDER — NALBUPHINE HCL 10 MG/ML IJ SOLN: 5.0000 mg | INTRAMUSCULAR | Status: DC | PRN

## 2017-03-20 MED ORDER — DIBUCAINE 1 % RE OINT: 1.0000 "application " | TOPICAL_OINTMENT | RECTAL | Status: DC | PRN

## 2017-03-20 MED ORDER — EPHEDRINE SULFATE 50 MG/ML IJ SOLN
INTRAMUSCULAR | Status: AC
  Filled 2017-03-20: qty 1

## 2017-03-20 MED ORDER — ZOLPIDEM TARTRATE 5 MG PO TABS: 5.0000 mg | ORAL_TABLET | Freq: Every evening | ORAL | Status: DC | PRN

## 2017-03-20 MED ORDER — OXYTOCIN 40 UNITS IN LACTATED RINGERS INFUSION - SIMPLE MED
INTRAVENOUS | Status: DC | PRN
  Administered 2017-03-20: 600 mL via INTRAVENOUS

## 2017-03-20 MED ORDER — CEFAZOLIN SODIUM-DEXTROSE 2-4 GM/100ML-% IV SOLN
2.0000 g | INTRAVENOUS | Status: AC
  Administered 2017-03-20: 2 g via INTRAVENOUS
  Filled 2017-03-20 (×2): qty 100

## 2017-03-20 MED ORDER — SIMETHICONE 80 MG PO CHEW
80.0000 mg | CHEWABLE_TABLET | ORAL | Status: DC
  Administered 2017-03-20 – 2017-03-23 (×3): 80 mg via ORAL
  Filled 2017-03-20 (×3): qty 1

## 2017-03-20 MED ORDER — FENTANYL CITRATE (PF) 100 MCG/2ML IJ SOLN
25.0000 ug | INTRAMUSCULAR | Status: DC | PRN
  Administered 2017-03-20: 50 ug via INTRAVENOUS
  Filled 2017-03-20: qty 2

## 2017-03-20 MED ORDER — MENTHOL 3 MG MT LOZG
1.0000 | LOZENGE | OROMUCOSAL | Status: DC | PRN
  Filled 2017-03-20: qty 9

## 2017-03-20 MED ORDER — ACETAMINOPHEN 500 MG PO TABS
1000.0000 mg | ORAL_TABLET | Freq: Four times a day (QID) | ORAL | Status: DC
  Administered 2017-03-20: 1000 mg via ORAL
  Filled 2017-03-20: qty 2

## 2017-03-20 MED ORDER — OXYCODONE-ACETAMINOPHEN 5-325 MG PO TABS
2.0000 | ORAL_TABLET | ORAL | Status: DC | PRN
  Administered 2017-03-21 – 2017-03-23 (×10): 2 via ORAL
  Filled 2017-03-20 (×10): qty 2

## 2017-03-20 SURGICAL SUPPLY — 24 items
BAG COUNTER SPONGE EZ (MISCELLANEOUS) ×2 IMPLANT
BENZOIN TINCTURE PRP APPL 2/3 (GAUZE/BANDAGES/DRESSINGS) ×2 IMPLANT
CANISTER SUCT 3000ML PPV (MISCELLANEOUS) ×2 IMPLANT
CHLORAPREP W/TINT 26ML (MISCELLANEOUS) ×4 IMPLANT
DRSG TELFA 3X8 NADH (GAUZE/BANDAGES/DRESSINGS) ×2 IMPLANT
ELECT REM PT RETURN 9FT ADLT (ELECTROSURGICAL) ×2
ELECTRODE REM PT RTRN 9FT ADLT (ELECTROSURGICAL) ×1 IMPLANT
GAUZE SPONGE 4X4 12PLY STRL (GAUZE/BANDAGES/DRESSINGS) ×2 IMPLANT
GLOVE BIO SURGEON STRL SZ 6.5 (GLOVE) ×2 IMPLANT
GLOVE INDICATOR 7.0 STRL GRN (GLOVE) ×2 IMPLANT
GOWN STRL REUS W/ TWL LRG LVL3 (GOWN DISPOSABLE) ×2 IMPLANT
GOWN STRL REUS W/TWL LRG LVL3 (GOWN DISPOSABLE) ×2
KIT RM TURNOVER STRD PROC AR (KITS) ×2 IMPLANT
NS IRRIG 1000ML POUR BTL (IV SOLUTION) ×2 IMPLANT
PACK C SECTION AR (MISCELLANEOUS) ×2 IMPLANT
PAD OB MATERNITY 4.3X12.25 (PERSONAL CARE ITEMS) ×2 IMPLANT
PAD PREP 24X41 OB/GYN DISP (PERSONAL CARE ITEMS) ×2 IMPLANT
STRIP CLOSURE SKIN 1/2X4 (GAUZE/BANDAGES/DRESSINGS) ×2 IMPLANT
SUCT VACUUM KIWI BELL (SUCTIONS) ×2 IMPLANT
SUT MNCRL AB 4-0 PS2 18 (SUTURE) ×2 IMPLANT
SUT PLAIN 2 0 XLH (SUTURE) IMPLANT
SUT VIC AB 0 CT1 36 (SUTURE) ×8 IMPLANT
SUT VIC AB 3-0 SH 27 (SUTURE) ×1
SUT VIC AB 3-0 SH 27X BRD (SUTURE) ×1 IMPLANT

## 2017-03-20 NOTE — H&P (Addendum)
Obstetric Preoperative History and Physical  CELINA SHILEY is a 41 y.o. G3P1011 with IUP at [redacted]w[redacted]d presenting for presenting for scheduled repeat cesarean section.  No acute concerns.   Prenatal Course Source of Care: [facility] with onset of care at 8 weeks Pregnancy complications or risks: Patient Active Problem List   Diagnosis Date Noted  . Obesity 11/01/2016  . H/O cesarean section 11/01/2016  . Uterine fibroids affecting pregnancy in first trimester 08/29/2016  . Diabetes mellitus type 2, controlled, without complications ([place])   . Hyperlipidemia LDL goal <70   . Kienbock's avascular necrosis of lunate, adult   . PCOS (polycystic ovarian syndrome) 04/15/2012  . Advanced maternal age in multigravida 04/15/2012  . OTHER SPECIFIED FORMS OF HEARING LOSS 03/10/2009  . PATELLO-FEMORAL SYNDROME 04/16/2008   She plans to breastfeed She desires undecided, but does desire LARC for postpartum contraception.   Prenatal labs and studies: ABO, Rh: --/--/O POS (08/28 1147) Antibody: NEG (08/28 1147) Rubella: 19.80 (01/29 0953) RPR: Non Reactive (08/28 1147)  HBsAg: Negative (01/29 0953)  HIV:   Negative (08/29 1147) HQI:ONGEXBMW (08/07 0000) 1 hr Glucola  Not performed as patient with pre-existing diabetes Genetic screening normal Anatomy US normal   Past Medical History:  Diagnosis Date  . Diabetes mellitus type 2, uncontrolled, without complications ([place]) 41/3244  . History of polycystic ovaries   . Hyperlipidemia LDL goal <70 06/2014  . Kienbock's avascular necrosis of lunate, adult   . Obesity   . Seronegative arthritis    possible, [place], [place]    Past Surgical History:  Procedure Laterality Date  . CESAREAN SECTION N/A 08/31/2012   Procedure: CESAREAN SECTION;  Surgeon: Mora Bellman, MD;  Location: [facility];  Service: Obstetrics;  Laterality: N/A;    OB History  Gravida Para Term Preterm AB Living  3 1 1   1 1   SAB TAB Ectopic Multiple Live Births    1      1    # Outcome Date GA Lbr Len/2nd Weight Sex Delivery Anes PTL Lv  3 Current           2 Term 08/31/12 [redacted]w[redacted]d  8 lb 12.4 oz (3.98 kg) M CS-LVertical EPI  LIV  1 TAB 1998    U    DEC     Birth Comments: Elective AB      Social History   Social History  . Marital status: Married    Spouse name: N/A  . Number of children: 1  . Years of education: N/A   Occupational History  . 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  [employer]  .  [employer]   Social History Main Topics  . Smoking status: Never Smoker  . Smokeless tobacco: Never Used  . Alcohol use No  . Drug use: No  . Sexual activity: Yes    Birth control/ protection: IUD, None     Comment: IUD removed in Oct 2017   Other Topics Concern  . 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 Stroke Mother        CVA (TIA)  . Diabetes Mother   . Diabetes Maternal Grandmother   . Hypertension Maternal Grandmother   . Diabetes Paternal Grandmother   . Hypertension Paternal Grandmother  Prescriptions Prior to Admission  Medication Sig Dispense Refill Last Dose  . ACCU-CHEK SOFTCLIX LANCETS lancets Check blood sugar twice a day and as directed. Dx E11.9 (Patient not taking: Reported on 03/19/2017) 100 each 5 Not Taking at Unknown time  . ferrous sulfate 325 (65 FE) MG tablet Take 325 mg by mouth daily with breakfast.     . glucose blood (ACCU-CHEK COMPACT PLUS) test strip Check blood sugar twice a day and as directed. Dx E11.9 (Patient not taking: Reported on 03/19/2017) 100 each 5 Not Taking at Unknown time  . glyBURIDE (DIABETA) 5 MG tablet Take 7.5 mg by mouth 2 (two) times daily.   1 Not Taking at Unknown time  . ketoconazole (NIZORAL) 2 % shampoo APPLICATIONS APPLY ON THE SKIN APPLY TO SCALP AND LET SIT SEVERAL MINS BEFORE RINSING AS NEEDED FOR SCALP PSORIASIS  5 Taking  . Prenatal Vit-Fe  Fumarate-FA (PRENATAL MULTIVITAMIN) TABS tablet Take 1 tablet by mouth daily.    Taking  . furosemide (LASIX) 20 MG tablet Take 1 tablet (20 mg total) by mouth daily. (Patient not taking: Reported on 03/15/2017) 2 tablet 0 Not Taking at Unknown time    Allergies  Allergen Reactions  . Vicodin [Hydrocodone-Acetaminophen] Swelling    Tongue swelling and headache    Review of Systems: Negative except for what is mentioned in HPI.  Physical Exam: LMP 06/21/2016  FHR by Doppler: 146 bpm GENERAL: Well-developed, well-nourished female in no acute distress.  LUNGS: Clear to auscultation bilaterally.  HEART: Regular rate and rhythm. ABDOMEN: Soft, nontender, nondistended, gravid, well-healed Pfannenstiel incision. PELVIC: Deferred EXTREMITIES: Nontender, +2 pitting edema, 2+ distal pulses.   Pertinent Labs/Studies:   Results for orders placed or performed during the hospital encounter of 03/19/17 (from the past 72 hour(s))  CBC     Status: Abnormal   Collection Time: 03/19/17 11:47 AM  Result Value Ref Range   WBC 6.6 3.6 - 11.0 K/uL   RBC 4.36 3.80 - 5.20 MIL/uL   Hemoglobin 11.9 (L) 12.0 - 16.0 g/dL   HCT 35.6 35.0 - 47.0 %   MCV 81.8 80.0 - 100.0 fL   MCH 27.2 26.0 - 34.0 pg   MCHC 33.3 32.0 - 36.0 g/dL   RDW 15.0 (H) 11.5 - 14.5 %   Platelets 192 150 - 440 K/uL  Rapid HIV screen (HIV 1/2 Ab+Ag)     Status: None   Collection Time: 03/19/17 11:47 AM  Result Value Ref Range   HIV-1 P24 Antigen - HIV24 NON REACTIVE NON REACTIVE   HIV 1/2 Antibodies NON REACTIVE NON REACTIVE   Interpretation (HIV Ag Ab)      A non reactive test result means that HIV 1 or HIV 2 antibodies and HIV 1 p24 antigen were not detected in the specimen.  RPR     Status: None   Collection Time: 03/19/17 11:47 AM  Result Value Ref Range   RPR Ser Ql Non Reactive Non Reactive    Comment: (NOTE) Performed At: [facility] [place], [place] [id number] Lindon Romp MD MI:[id number]    Type and screen     Status: None   Collection Time: 03/19/17 11:47 AM  Result Value Ref Range   ABO/RH(D) O POS    Antibody Screen NEG    Sample Expiration 03/22/2017    Extend sample reason PREGNANT WITHIN 3 MONTHS, UNABLE TO EXTEND     Assessment and Plan :GIANNI FUCHS is a 41 y.o. G3P1011 at [redacted]w[redacted]d  being admitted  for scheduled repeat cesarean section delivery . The patient is understanding of the planned procedure and is aware of and accepting of all surgical risks, including but not limited to: bleeding which may require transfusion or reoperation; infection which may require antibiotics; injury to bowel, bladder, ureters or other surrounding organs which may require repair; injury to the fetus; need for additional procedures including hysterectomy in the event of life-threatening complications; placental abnormalities wth subsequent pregnancies; incisional problems; blood clot disorders which may require blood thinners;, and other postoperative/anesthesia complications. The patient is in agreement with the proposed plan, and gives informed written consent for the procedure. All questions have been answered.   Rubie Maid, MD [facility]

## 2017-03-20 NOTE — Lactation Note (Signed)
This note was copied from a baby's chart. Lactation Consultation Note  Patient Name: Priscilla King [id number]'P Date: 03/20/2017 Reason for consult: Initial assessment;Other (Comment) (LGA Baby ' maternal diabetes)  I tried to help Mom with breastfeeding an hour after birth when I was called to room after C/S. LGA baby and mom with diabetes. Baby very sleepy. I tried wake techniques and alternating breasts and hand expression and suck training. She would suck briefly on finger, but not Mom's breast. Temp a bit low so I put hat on baby and snuggled her skin to skin with Mom again with blanket behind baby to warm up and then we will try to entice her to feed again. Report given to [facility] RN  Maternal Data Has patient been taught Hand Expression?: Yes (needs review and practice) Does the patient have breastfeeding experience prior to this delivery?: Yes  Feeding Length of feed: 0 min  LATCH Score Latch: Too sleepy or reluctant, no latch achieved, no sucking elicited.  Audible Swallowing: None  Type of Nipple: Everted at rest and after stimulation  Comfort (Breast/Nipple): Soft / non-tender  Hold (Positioning): Full assist, staff holds infant at breast  LATCH Score: 4  Interventions Interventions: Breast feeding basics reviewed;Assisted with latch;Skin to skin;Hand express;Adjust position;Support pillows;Position options (tried hand expression; O colostrum yet)  Lactation Tools Discussed/Used     Consult Status Consult Status: Follow-up Date: 03/21/17 Follow-up type: In-patient    Roque Cash 03/20/2017, 4:32 PM

## 2017-03-20 NOTE — Anesthesia Post-op Follow-up Note (Signed)
Anesthesia QCDR form completed.        

## 2017-03-20 NOTE — Lactation Note (Signed)
This note was copied from a baby's chart. Lactation Consultation Note  Patient Name: Priscilla King [id number]'P Date: 03/20/2017 Reason for consult: Follow-up assessment  Temp improved to 98.1 with skin to skin. Baby more alert. Still struggles to obtain/maintain deep latch. She tends to hold her tongue back in mouth, preventing correct latch.(Tongue did pass gumline and lifts more than halfway and makes lateral movement)  I tried suck training. It feels correct with that but she does not do the same at breast. Due to blood sugar of 38, we tried hand expression again without results. I next tried 24 mm nipple shield. It did improve latch and suck swallow, just not optimal. It is a weak-moderate suck ( I can feel the sucks under my fingers that are on Mom 's breast to help with compression) She sometimes make a clicking sound until I reposition her head, then it improves. Mom concerned about baby getting formula if blood sugar does not improve with her breastmilk/feeding as other child has dairy allergies. I discussed this with [facility] RN Hoyle Sauer. There is donor milk in SCN that could be used if needed once consent form is signed. Mom stated that she would be agreeable to that if it is needed. We are still working on breastfeeding now.   Maternal Data Has patient been taught Hand Expression?: Yes (needs review and practice) Does the patient have breastfeeding experience prior to this delivery?: Yes  Feeding Feeding Type: Breast Fed Length of feed: 0 min  LATCH Score Latch: Grasps breast easily, tongue down, lips flanged, rhythmical sucking. (with NS only)  Audible Swallowing: A few with stimulation  Type of Nipple: Everted at rest and after stimulation  Comfort (Breast/Nipple): Soft / non-tender  Hold (Positioning): Full assist, staff holds infant at breast  LATCH Score: 7  Interventions Interventions:  (NS to help with latch; discussed possible donor milk if < BS)  Lactation Tools  Discussed/Used Tools: Nipple Harsh Trulock Nipple shield size: 24   Consult Status Consult Status: Follow-up Date: 03/21/17 Follow-up type: In-patient    Roque Cash 03/20/2017, 5:33 PM

## 2017-03-20 NOTE — Anesthesia Procedure Notes (Signed)
Spinal  Start time: 03/20/2017 2:28 PM End time: 03/20/2017 2:32 PM Staffing Anesthesiologist: Marline Backbone F Performed: anesthesiologist  Preanesthetic Checklist Completed: patient identified, site marked, surgical consent, pre-op evaluation, timeout performed, IV checked, risks and benefits discussed and monitors and equipment checked Spinal Block Patient position: sitting Prep: DuraPrep Patient monitoring: heart rate, cardiac monitor and blood pressure Approach: midline Location: L3-4 Injection technique: single-shot Assessment Sensory level: T6

## 2017-03-20 NOTE — Progress Notes (Signed)
Report received from L. Elks, RN at bedside. Report also called per L. Elks to Santa Lighter RN. Pt denies complaint at this time. Resting comfortably at this time. Will continue to monitor until transfer.

## 2017-03-20 NOTE — Transfer of Care (Signed)
Immediate Anesthesia Transfer of Care Note  Patient: Priscilla King  Procedure(s) Performed: Procedure(s) with comments: REPEAT CESAREAN SECTION (N/A) - Female born @ 1500 Apgars: 8/9 Weight:10lbs 9ozs  Patient Location: Mother/Baby  Anesthesia Type:Spinal  Level of Consciousness: awake, alert , oriented and patient cooperative  Airway & Oxygen Therapy: Patient Spontanous Breathing  Post-op Assessment: Report given to RN and Post -op Vital signs reviewed and stable  Post vital signs: Reviewed and stable  Last Vitals:  Vitals:   03/20/17 1156 03/20/17 1557  BP: (!) 146/90 (!) 136/95  Pulse: 87 77  Resp: 18 19  Temp:  36.7 C  SpO2:  100%    Last Pain:  Vitals:   03/20/17 1557  TempSrc: Oral  PainSc:          Complications: No apparent anesthesia complications

## 2017-03-20 NOTE — Anesthesia Preprocedure Evaluation (Signed)
Anesthesia Evaluation  Patient identified by MRN, date of birth, ID band Patient awake    Reviewed: Allergy & Precautions, H&P , NPO status , Patient's Chart, lab work & pertinent test results  History of Anesthesia Complications Negative for: history of anesthetic complications  Airway Mallampati: III  TM Distance: >3 FB Neck ROM: full    Dental  (+) Chipped   Pulmonary neg pulmonary ROS, neg shortness of breath,           Cardiovascular Exercise Tolerance: Good (-) hypertension(-) angina(-) Past MI negative cardio ROS       Neuro/Psych    GI/Hepatic negative GI ROS, neg GERD  ,  Endo/Other  diabetes, Type 2  Renal/GU   negative genitourinary   Musculoskeletal  (+) Arthritis ,   Abdominal   Peds  Hematology negative hematology ROS (+)   Anesthesia Other Findings Patient reports no problems with percocet   Past Medical History: 06/2014: Diabetes mellitus type 2, uncontrolled, without  complications ([place]) No date: History of polycystic ovaries 06/2014: Hyperlipidemia LDL goal <70 No date: Kienbock's avascular necrosis of lunate, adult No date: Obesity No date: Seronegative arthritis     Comment:  possible, [place], [place]  Past Surgical History: 08/31/2012: CESAREAN SECTION; N/A     Comment:  Procedure: CESAREAN SECTION;  Surgeon: Mora Bellman,               MD;  Location: [place] [facility];  Service: Obstetrics;  Laterality:              N/A;     Reproductive/Obstetrics (+) Pregnancy                             Anesthesia Physical Anesthesia Plan  ASA: III  Anesthesia Plan: Spinal   Post-op Pain Management:    Induction:   PONV Risk Score and Plan:   Airway Management Planned: Natural Airway and Nasal Cannula  Additional Equipment:   Intra-op Plan:   Post-operative Plan:   Informed Consent: I have reviewed the patients History and Physical, chart, labs and discussed the  procedure including the risks, benefits and alternatives for the proposed anesthesia with the patient or authorized representative who has indicated his/her understanding and acceptance.   Dental Advisory Given  Plan Discussed with: Anesthesiologist, CRNA and Surgeon  Anesthesia Plan Comments: (Patient reports no bleeding problems and no anticoagulant use.  Plan for spinal with backup GA  Patient consented for risks of anesthesia including but not limited to:  - adverse reactions to medications - risk of bleeding, infection, nerve damage and headache - risk of failed spinal - damage to teeth, lips or other oral mucosa - sore throat or hoarseness - Damage to heart, brain, lungs or loss of life  Patient voiced understanding.)        Anesthesia Quick Evaluation

## 2017-03-20 NOTE — Op Note (Signed)
Cesarean Section Procedure Note  Indications: previous uterine incision low-transvers C-section x 1  Pre-operative Diagnosis: 38 week 6 day pregnancy, Type II DM (uncontrolled), prior C-section x 1 declining trial of labor, obesity, fibroid uterus.  Post-operative Diagnosis: same  Surgeon: Rubie Maid, MD  Assistants: Dani Gobble, CNM  Procedure: Repeat low transverse Cesarean Section  Anesthesia: Spinal anesthesia  Procedure Details: The patient was seen in the Holding Room. The risks, benefits, complications, treatment options, and expected outcomes were discussed with the patient.  The patient concurred with the proposed plan, giving informed consent.  The site of surgery properly noted/marked. The patient was taken to the Operating Room, identified as Priscilla King and the procedure verified as C-Section Delivery. A Time Out was held and the above information confirmed.  After induction of anesthesia, the patient was draped and prepped in the usual sterile manner. Anesthesia was tested and noted to be adequate. A Pfannenstiel incision was made and carried down through the subcutaneous tissue to the fascia. Fascial incision was made and extended transversely. The fascia was separated from the underlying rectus tissue superiorly and inferiorly. The peritoneum was identified and entered. Peritoneal incision was extended longitudinally. The utero-vesical peritoneal reflection was incised transversely and the bladder flap was bluntly freed from the lower uterine segment. A low transverse uterine incision was made. Delivered from cephalic presentation was a 4790 gram Female with Apgar scores of 8 at one minute and 9 at five minutes.  Vacuum assistance (Kiwi vacuum) for delivery of the head was used due to surrounding tissue distocia and fetal size.  No pop-off's occurred. After the umbilical cord was clamped and cut, cord blood was obtained for evaluation. The placenta was removed intact  and appeared normal. The uterus was exteriorized and cleared of all clots and debris. The uterine outline was noted to have 3 medium sized subserosal fibroids along the left fundal edge.  Another small intramural fibroid was identified on the posterior segment of the uterus near the broad ligament.  The tubes and ovaries appeared normal.  The uterine incision was closed with a running locked suture of 0-Vicryl.  A second suture of 0-Vicryl was used in an imbricating layer.  Hemostasis was observed.  The uterus was returned to the abdomen.  Lavage was carried out until clear. The fascia was then reapproximated with a running suture of 0-Vicryl. The skin was reapproximated with 4-0 Monocryl.  Instrument, sponge, and needle counts were correct prior the abdominal closure and at the conclusion of the case.   Findings: Female infant, cephalic presentation, 5053 grams, with Apgar scores of 8 at one minute and 9 at five minutes. Intact placenta with 3 vessel cord.  Clear copious amniotic fluid at rupture.  The uterine outline was noted to have 3 medium sized subserosal fibroids along the left fundal edge.  Another small intramural fibroid was identified on the posterior segment of the uterus near the broad ligament.  The tubes and ovaries appeared normal.   Estimated Blood Loss:  600 ml      Drains: foley catheter to gravity drainage, 100 ml of clear urine at end of the procedure         Total IV Fluids:  1400 ml  Specimens: Cord blood         Implants: None         Complications:  None; patient tolerated the procedure well.         Disposition: PACU - hemodynamically stable.  Condition: stable   Rubie Maid, MD [facility]

## 2017-03-21 ENCOUNTER — Encounter: Payer: Self-pay | Admitting: Obstetrics and Gynecology

## 2017-03-21 LAB — GLUCOSE, CAPILLARY
GLUCOSE-CAPILLARY: 132 mg/dL — AB (ref 65–99)
GLUCOSE-CAPILLARY: 139 mg/dL — AB (ref 65–99)
GLUCOSE-CAPILLARY: 125 mg/dL — AB (ref 65–99)
GLUCOSE-CAPILLARY: 142 mg/dL — AB (ref 65–99)
GLUCOSE-CAPILLARY: 148 mg/dL — AB (ref 65–99)

## 2017-03-21 LAB — CBC
HCT: 30.9 % — ABNORMAL LOW (ref 35.0–47.0)
Hemoglobin: 10.6 g/dL — ABNORMAL LOW (ref 12.0–16.0)
MCH: 27.6 pg (ref 26.0–34.0)
MCHC: 34.1 g/dL (ref 32.0–36.0)
MCV: 81 fL (ref 80.0–100.0)
PLATELETS: 176 10*3/uL (ref 150–440)
RBC: 3.82 MIL/uL (ref 3.80–5.20)
RDW: 15.1 % — AB (ref 11.5–14.5)
WBC: 10.8 10*3/uL (ref 3.6–11.0)

## 2017-03-21 MED ORDER — IBUPROFEN 600 MG PO TABS
600.0000 mg | ORAL_TABLET | Freq: Four times a day (QID) | ORAL | Status: DC
  Administered 2017-03-22 – 2017-03-23 (×6): 600 mg via ORAL
  Filled 2017-03-21 (×6): qty 1

## 2017-03-21 NOTE — Addendum Note (Signed)
Addendum  created 03/21/17 0723 by Demetrius Charity, CRNA   Sign clinical note

## 2017-03-21 NOTE — Progress Notes (Signed)
Inpatient Diabetes Program Recommendations  AACE/ADA: New Consensus Statement on Inpatient Glycemic Control (2015)  Target Ranges:  Prepandial:   less than 140 mg/dL      Peak postprandial:   less than 180 mg/dL (1-2 hours)      Critically ill patients:  140 - 180 mg/dL   Lab Results  Component Value Date   GLUCAP 139 (H) 03/21/2017   HGBA1C 6.0 08/01/2016   Inpatient Diabetes Program Recommendations:  consider checking CBG tid and hs while patient is in hospital.   Gentry Fitz, RN, BA, MHA, CDE Diabetes Coordinator Inpatient Diabetes Program  [phone] (Team Pager) [phone] ([place]) 03/21/2017 9:45 AM

## 2017-03-21 NOTE — Anesthesia Post-op Follow-up Note (Signed)
  Anesthesia Pain Follow-up Note  Patient: Priscilla King  Day #: 1  Date of Follow-up: 03/21/2017 Time: 7:22 AM  Last Vitals:  Vitals:   03/21/17 0313 03/21/17 0423  BP: 136/88 (!) 143/65  Pulse: 72 67  Resp: 18 20  Temp: 36.8 C 36.8 C  SpO2: 99% 100%    Level of Consciousness: alert  Pain: mild   Side Effects:None  Catheter Site Exam:clean, dry     Plan: D/C from anesthesia care at surgeon's request  Blima Singer

## 2017-03-21 NOTE — Progress Notes (Signed)
Postpartum Day # 1: Cesarean Delivery.  PMH of Type II DM, newly developed mild pre-eclampsia postpartum  Subjective: Patient reports incisional pain.  Is not yet ambulating or voiding.  Has not yet passed flatus.    Objective: Vital signs in last 24 hours: Temp:  [97.3 F (36.3 C)-99.4 F (37.4 C)] 98.3 F (36.8 C) (08/30 0423) Pulse Rate:  [67-125] 67 (08/30 0423) Resp:  [11-30] 20 (08/30 0423) BP: (118-169)/(61-95) 143/65 (08/30 0423) SpO2:  [83 %-100 %] 100 % (08/30 0423) Weight:  [270 lb 3.2 oz (122.6 kg)] 270 lb 3.2 oz (122.6 kg) (08/29 2009)  Physical Exam:  General: alert and no distress Lungs: clear to auscultation bilaterally Breasts: normal appearance, no masses or tenderness Heart: regular rate and rhythm, S1, S2 normal, no murmur, click, rub or gallop Abdomen: soft, non-tender; bowel sounds normal; no masses,  no organomegaly Pelvis: Lochia appropriate, Uterine Fundus firm, Incision: bandage with small amount of old dry blood.   Extremities: DVT Evaluation: Negative Homan's sign. No cords or calf tenderness. No significant calf/ankle edema.   Recent Labs  03/20/17 1724 03/21/17 0510  HGB 11.4* 10.6*  HCT 33.9* 30.9*     Accucheck this a.m. At 4:30 was 132 (however was not fasting as patient had a ginger ale at 2:00 a.m.)  Assessment/Plan: Status post Cesarean section. Doing well postoperatively.  Breastfeeding, Lactation consult and Contraception undecided but desires something long-term.  Advance diet Continue PO pain management.  Patient has not been receiving her Percocet as scheduled.  Will discuss with nursing.  Remove foley catheter Discontinue IVF Continue to monitor blood sugars for diabetes.  Patient was diet controlled prior to pregnancy.  Mild pre-eclampsia, newly diagnosed postpartum, with 2 elevated BPs immediately postpartum treated with Labetalol.  Has not had any further elevations since then.  Denies any signs symptoms.  Continue to  monitor. Continue current care.   Rubie Maid, MD [facility]

## 2017-03-21 NOTE — Anesthesia Postprocedure Evaluation (Signed)
Anesthesia Post Note  Patient: Priscilla King  Procedure(s) Performed: Procedure(s) (LRB): REPEAT CESAREAN SECTION (N/A)  Patient location during evaluation: Mother Baby Anesthesia Type: Spinal Level of consciousness: awake and alert and oriented Pain management: satisfactory to patient Vital Signs Assessment: post-procedure vital signs reviewed and stable Respiratory status: respiratory function stable Cardiovascular status: stable Postop Assessment: no backache, no headache, spinal receding, adequate PO intake, no signs of nausea or vomiting and patient able to bend at knees Anesthetic complications: no     Last Vitals:  Vitals:   03/21/17 0313 03/21/17 0423  BP: 136/88 (!) 143/65  Pulse: 72 67  Resp: 18 20  Temp: 36.8 C 36.8 C  SpO2: 99% 100%    Last Pain:  Vitals:   03/21/17 0423  TempSrc: Oral  PainSc:                  Blima Singer

## 2017-03-21 NOTE — Lactation Note (Signed)
Lactation Consultation Note  Patient Name: Priscilla King [id number] Date: 03/21/2017 Reason for consult: Initial assessment   Maternal Data Does the patient have breastfeeding experience prior to this delivery?: Yes  Feeding Feeding Type: Breast Fed  LATCH Score Latch: Repeated attempts needed to sustain latch, nipple held in mouth throughout feeding, stimulation needed to elicit sucking reflex.  Audible Swallowing: A few with stimulation  Type of Nipple: Everted at rest and after stimulation  Comfort (Breast/Nipple): Soft / non-tender Mother has been using a shield since delivery to get the baby to latch.  Hold (Positioning): Assistance needed to correctly position infant at breast and maintain latch.  LATCH Score: 7  Interventions Interventions: Breast feeding basics reviewed;Support pillows;Assisted with latch  Baby has difficulty latching and no swallows were heard. Took the baby off the shield and will teach about hand expression at next feeding.  Lactation Tools Discussed/Used Tools: Nipple Shields Nipple shield size: 20 WIC Program: No   Consult Status Consult Status: PRN Follow-up type: In-patient    Daryel November 03/21/2017, 11:17 AM

## 2017-03-22 LAB — GLUCOSE, CAPILLARY
GLUCOSE-CAPILLARY: 157 mg/dL — AB (ref 65–99)
GLUCOSE-CAPILLARY: 98 mg/dL (ref 65–99)
GLUCOSE-CAPILLARY: 118 mg/dL — AB (ref 65–99)
Glucose-Capillary: 120 mg/dL — ABNORMAL HIGH (ref 65–99)

## 2017-03-22 NOTE — Progress Notes (Signed)
Postpartum Day # 2: Cesarean Delivery.  PMH of Type II DM, newly developed mild pre-eclampsia postpartum  Subjective: Patient reports incisional pain.  Is ambulating and voiding without difficulty.  Has passed flatus.  Has not yet had BM.  Tolerating diet. Pain better controlled, however is noting pain more on the right side of incision than anywhere else.    Objective:  Vitals:   03/21/17 1932 03/21/17 2314 03/22/17 0429 03/22/17 0826  BP: 140/74 (!) 154/81 (!) 144/81 133/74  Pulse: 71 86 74 74  Resp: 20 16 20    Temp: 97.8 F (36.6 C) 98 F (36.7 C) 98.1 F (36.7 C) 98.4 F (36.9 C)  TempSrc: Oral Oral Oral Oral  SpO2: 99% 98% 99%   Weight:      Height:        Physical Exam:  General: alert and no distress Lungs: clear to auscultation bilaterally Breasts: normal appearance, no masses or tenderness Heart: regular rate and rhythm, S1, S2 normal, no murmur, click, rub or gallop Abdomen: soft, non-tender; bowel sounds normal; no masses,  no organomegaly Pelvis: Lochia appropriate, Uterine Fundus firm, Incision: bandage removed, incision healing well, clean, dry, intact.    Extremities: DVT Evaluation: Negative Homan's sign. No cords or calf tenderness. No significant calf/ankle edema but ankle swelling present.   Recent Labs  03/20/17 1724 03/21/17 0510  HGB 11.4* 10.6*  HCT 33.9* 30.9*     Results for LAMIS, BEHRMANN (MRN [id number]) as of 03/22/2017 08:52  Ref. Range 03/21/2017 08:23 03/21/2017 14:21 03/21/2017 17:18 03/21/2017 21:07 03/22/2017 06:38  Glucose-Capillary Latest Ref Range: 65 - 99 mg/dL 139 (H) 125 (H) 142 (H) 148 (H) 157 (H)   Assessment/Plan: Status post Cesarean section. Doing well postoperatively.  Breastfeeding, Lactation consult and Contraception undecided but desires something long-term.  Diabetic diet Continue PO pain management.   Encourage ambulation Continue to monitor blood sugars for diabetes.  Patient was diet controlled prior to pregnancy.  Several postprandials mildly elevated. Fasting elevated again today, however patient notes that she did have some juice overnight. Continue to monitor.  If her blood sugars become significantly elevated, may d/c home with Metformin until postpartum check. Mild pre-eclampsia, newly diagnosed postpartum, labile BPs noted but no further in severe range.    Denies any signs symptoms.  Continue to monitor. Continue current care. Likely d/c home in a.m.    Rubie Maid, MD [facility]

## 2017-03-22 NOTE — Progress Notes (Signed)
Inpatient Diabetes Program Recommendations  AACE/ADA: New Consensus Statement on Inpatient Glycemic Control (2015)  Target Ranges:  Prepandial:   less than 140 mg/dL      Peak postprandial:   less than 180 mg/dL (1-2 hours)      Critically ill patients:  140 - 180 mg/dL   Lab Results  Component Value Date   GLUCAP 157 (H) 03/22/2017   HGBA1C 6.0 08/01/2016    Review of Glycemic Control  Results for [name], Priscilla King (MRN [id number]) as of 03/22/2017 09:01  Ref. Range 03/21/2017 08:23 03/21/2017 14:21 03/21/2017 17:18 03/21/2017 21:07 03/22/2017 06:38  Glucose-Capillary Latest Ref Range: 65 - 99 mg/dL 139 (H) 125 (H) 142 (H) 148 (H) 157 (H)     Outpatient Diabetes medications: Glyburide 7.5mg  bid Current orders for Inpatient glycemic control: none  Inpatient Diabetes Program Recommendations:  Consider Metformin 500mg  bid  Gentry Fitz, RN, [place], [place], CDE Diabetes Coordinator Inpatient Diabetes Program  [phone] (Team Pager) [phone] ([place]) 03/22/2017 9:02 AM

## 2017-03-23 MED ORDER — IBUPROFEN 800 MG PO TABS: 800.0000 mg | ORAL_TABLET | Freq: Three times a day (TID) | ORAL | 0 refills | Status: DC | PRN

## 2017-03-23 MED ORDER — OXYCODONE-ACETAMINOPHEN 5-325 MG PO TABS: 1.0000 | ORAL_TABLET | ORAL | 0 refills | Status: DC | PRN

## 2017-03-23 NOTE — Discharge Summary (Signed)
@  [email or web address]  Physician Obstetric Discharge Summary  Patient ID: Priscilla King MRN: [id number] DOB/AGE: 04/15/1976 41 y.o.   Date of Admission: 03/20/2017  Date of Discharge:   03/23/2017  Admitting Diagnosis: Scheduled cesarean section at [redacted]w[redacted]d  Secondary Diagnosis: Gestational diabetes medication controlled (A2)  Mode of Delivery:  cesarean section   low uterine, transverse                          Discharge Day SOAP Note:  Subjective:  The patient has no complaints.  She is ambulating well. She is taking PO well. Pain is well controlled with current medications. Patient is urinating without difficulty.   She is passing flatus.    Objective  Vital signs in last 24 hours: BP (!) 142/84 (BP Location: Left Arm) Comment: sitting on side of bed  Pulse 71   Temp 97.7 F (36.5 C) (Axillary)   Resp 18   Ht 5\' 8"  (1.727 m)   Wt 270 lb 3.2 oz (122.6 kg)   LMP 06/21/2016   SpO2 99%   BMI 41.08 kg/m   Physical Exam: Gen: NAD Abdomen:  clean, dry, healing Fundus Fundal Tone: Firm  Lochia Amount: Scant     Data Review Labs: CBC Latest Ref Rng & Units 03/21/2017 03/20/2017 03/19/2017  WBC 3.6 - 11.0 K/uL 10.8 8.0 6.6  Hemoglobin 12.0 - 16.0 g/dL 10.6(L) 11.4(L) 11.9(L)  Hematocrit 35.0 - 47.0 % 30.9(L) 33.9(L) 35.6  Platelets 150 - 440 K/uL 176 201 192   O POS  Assessment:  Active Problems:   Advanced maternal age in multigravida     Overview: Normal [place] test 49, XY   Diabetes mellitus type 2, controlled, without complications ([place])   Obesity   H/O cesarean section   S/P cesarean section   Doing well.  Normal progress as expected.  Pt desires discharge  Plan:  Discharge to home  Modified rest as directed - may slowly resume normal activities with restrictions  as discussed.  Medications as written.  See below for additional.  Glucose monitoring as directed  Discharge Instructions: Per After Visit Summary. Activity: Advance as tolerated. Pelvic rest for 6  weeks.  Also refer to After Visit Summary.  Wound care discussed. Diet: Regular   Outpatient follow up:  Follow-up Information    Rubie Maid, MD Follow up.   Specialties:  Obstetrics and Gynecology, Radiology Contact information: [address] [place] [address] [phone]          Postpartum contraception: discuss at pp visit  Discharged Condition: good  Discharged to: home  Newborn Data: Disposition:home with mother  Apgars: APGAR (1 MIN): 8   APGAR (5 MINS): 9   APGAR (10 MINS):    Baby Feeding: Breast  Finis Bud, M.D. 03/23/2017 10:28 AM

## 2017-03-28 ENCOUNTER — Ambulatory Visit (INDEPENDENT_AMBULATORY_CARE_PROVIDER_SITE_OTHER): Payer: BLUE CROSS/BLUE SHIELD | Admitting: Certified Nurse Midwife

## 2017-03-28 ENCOUNTER — Telehealth: Payer: Self-pay

## 2017-03-28 ENCOUNTER — Encounter: Payer: Self-pay | Admitting: Certified Nurse Midwife

## 2017-03-28 ENCOUNTER — Encounter: Payer: BLUE CROSS/BLUE SHIELD | Admitting: Obstetrics and Gynecology

## 2017-03-28 ENCOUNTER — Telehealth: Payer: Self-pay | Admitting: Obstetrics and Gynecology

## 2017-03-28 VITALS — BP 152/93 | HR 74 | Ht 68.0 in | Wt 236.0 lb

## 2017-03-28 DIAGNOSIS — O1205 Gestational edema, complicating the puerperium: Secondary | ICD-10-CM

## 2017-03-28 DIAGNOSIS — Z5189 Encounter for other specified aftercare: Secondary | ICD-10-CM

## 2017-03-28 NOTE — Patient Instructions (Signed)
Postpartum Hypertension °Postpartum hypertension is high blood pressure after pregnancy that remains higher than normal for more than two days after delivery. You may not realize that you have postpartum hypertension if your blood pressure is not being checked regularly. In some cases, postpartum hypertension will go away on its own, usually within a week of delivery. However, for some women, medical treatment is required to prevent serious complications, such as seizures or stroke. °The following things can affect your blood pressure: °· The type of delivery you had. °· Having received IV fluids or other medicines during or after delivery. ° °What are the causes? °Postpartum hypertension may be caused by any of the following or by a combination of any of the following: °· Hypertension that existed before pregnancy (chronic hypertension). °· Gestational hypertension. °· Preeclampsia or eclampsia. °· Receiving a lot of fluid through an IV during or after delivery. °· Medicines. °· HELLP syndrome. °· Hyperthyroidism. °· Stroke. °· Other rare neurological or blood disorders. ° °In some cases, the cause may not be known. °What increases the risk? °Postpartum hypertension can be related to one or more risk factors, such as: °· Chronic hypertension. In some cases, this may not have been diagnosed before pregnancy. °· Obesity. °· Type 2 diabetes. °· Kidney disease. °· Family history of preeclampsia. °· Other medical conditions that cause hormonal imbalances. ° °What are the signs or symptoms? °As with all types of hypertension, postpartum hypertension may not have any symptoms. Depending on how high your blood pressure is, you may experience: °· Headaches. These may be mild, moderate, or severe. They may also be steady, constant, or sudden in onset (thunderclap headache). °· Visual changes. °· Dizziness. °· Shortness of breath. °· Swelling of your hands, feet, lower legs, or face. In some cases, you may have swelling in  more than one of these locations. °· Heart palpitations or a racing heartbeat. °· Difficulty breathing while lying down. °· Decreased urination. ° °Other rare signs and symptoms may include: °· Sweating more than usual. This lasts longer than a few days after delivery. °· Chest pain. °· Sudden dizziness when you get up from sitting or lying down. °· Seizures. °· Nausea or vomiting. °· Abdominal pain. ° °How is this diagnosed? °The diagnosis of postpartum hypertension is made through a combination of physical examination findings and testing of your blood and urine. You may also have additional tests, such as a CT scan or an MRI, to check for other complications of postpartum hypertension. °How is this treated? °When blood pressure is high enough to require treatment, your options may include: °· Medicines to reduce blood pressure (antihypertensives). Tell your health care provider if you are breastfeeding or if you plan to breastfeed. There are many antihypertensive medicines that are safe to take while breastfeeding. °· Stopping medicines that may be causing hypertension. °· Treating medical conditions that are causing hypertension. °· Treating the complications of hypertension, such as seizures, stroke, or kidney problems. ° °Your health care provider will also continue to monitor your blood pressure closely and repeatedly until it is within a safe range for you. °Follow these instructions at home: °· Take medicines only as directed by your health care provider. °· Get regular exercise after your health care provider tells you that it is safe. °· Follow your health care provider’s recommendations on fluid and salt restrictions. °· Do not use any tobacco products, including cigarettes, chewing tobacco, or electronic cigarettes. If you need help quitting, ask your health care provider. °·   Keep all follow-up visits as directed by your health care provider. This is important. °Contact a health care provider  if: °· Your symptoms get worse. °· You have new symptoms, such as: °? Headache. °? Dizziness. °? Visual changes. °Get help right away if: °· You develop a severe or sudden headache. °· You have seizures. °· You develop numbness or weakness on one side of your body. °· You have difficulty thinking, speaking, or swallowing. °· You develop severe abdominal pain. °· You develop difficulty breathing, chest pain, a racing heartbeat, or heart palpitations. °These symptoms may represent a serious problem that is an emergency. Do not wait to see if the symptoms will go away. Get medical help right away. Call your local emergency services (911 in the [place] Do not drive yourself to the hospital. °This information is not intended to replace advice given to you by your health care provider. Make sure you discuss any questions you have with your health care provider. °Document Released: 03/12/2014 Document Revised: 12/12/2015 Document Reviewed: 01/21/2014 °Elsevier Interactive Patient Education © 2018 [employer] ° °

## 2017-03-28 NOTE — Telephone Encounter (Signed)
Called pt informed her of the need to move her appt to 4:15 due to Dr.Cherry having surgery. Pt gave verbal understanding. Appt moved.

## 2017-03-28 NOTE — Telephone Encounter (Signed)
Contacted patient regarding [place] labs.  Patient notes that she is feeling better. Advised to continue to monitor symptoms, and if worse, f/u in clinic ASAP.

## 2017-03-28 NOTE — Progress Notes (Signed)
    OBSTETRICS/GYNECOLOGY POST-OPERATIVE CLINIC VISIT  Subjective:     Priscilla King is a 41 y.o. female who presents to the clinic 1 weeks status post repeat cesarean sectoin.   Eating a regular diet without difficulty. Bowel movements are normal. Pain is controlled with current analgesics. Medications being used: ibuprofen (OTC).   Reports headache for the last two (2) days that does not resolve when taking Motrin or Tylenol.   Denies difficulty breathing or respiratory distress, chest pain, blurred vision, epigastric pain, and excessive vaginal bleeding.   The following portions of the patient's history were reviewed and updated as appropriate: allergies, current medications, past family history, past medical history, past social history, past surgical history and problem list.  Review of Systems Pertinent items are noted in HPI.    Objective:    BP (!) 152/93 (BP Location: Left Arm, Patient Position: Sitting, Cuff Size: Large)   Pulse 74   Ht 5\' 8"  (1.727 m)   Wt 236 lb (107 kg)   LMP 06/21/2016   Breastfeeding? Unknown   BMI 35.88 kg/m    General:  alert and no distress  Abdomen: soft, bowel sounds active, non-tender  Incision:   no erythema, no hernia, no seroma, no swelling, approximately one(1) inch opening present of right side of incision  Lower extremity Pitting edema, 2+ present; negative homan sign, temperature WNL    Assessment:    Postoperative course complicated by postpartum headache, elevated blood pressure, and slow healing incision   Plan:   1. Continue any current medications. 2. Wound care discussed including covering wound with white towel or unscented pad. Steri strips removed, would cleaned with betadine, and steri strips reapplied to right side of wound.  3. Activity restrictions: no lifting more than 25 pounds 4. RTC x 1 week for incision check.    Diona Fanti, CNM [facility]

## 2017-04-02 LAB — HEPATIC FUNCTION PANEL
ALT: 52 IU/L — AB (ref 0–32)
AST: 24 IU/L (ref 0–40)
Alkaline Phosphatase: 107 IU/L (ref 39–117)
BILIRUBIN TOTAL: 0.3 mg/dL (ref 0.0–1.2)
BILIRUBIN, DIRECT: 0.09 mg/dL (ref 0.00–0.40)
Total Protein: 6.4 g/dL (ref 6.0–8.5)

## 2017-04-02 LAB — RENAL FUNCTION PANEL
Albumin: 3.3 g/dL — ABNORMAL LOW (ref 3.5–5.5)
BUN / CREAT RATIO: 15 (ref 9–23)
BUN: 8 mg/dL (ref 6–24)
CO2: 21 mmol/L (ref 20–29)
CREATININE: 0.54 mg/dL — AB (ref 0.57–1.00)
Calcium: 8.6 mg/dL — ABNORMAL LOW (ref 8.7–10.2)
Chloride: 101 mmol/L (ref 96–106)
GFR, EST AFRICAN AMERICAN: 136 mL/min/{1.73_m2} (ref >59)
GFR, EST NON AFRICAN AMERICAN: 118 mL/min/{1.73_m2} (ref >59)
Glucose: 96 mg/dL (ref 65–99)
Phosphorus: 3.2 mg/dL (ref 2.5–4.5)
Potassium: 3.8 mmol/L (ref 3.5–5.2)
SODIUM: 137 mmol/L (ref 134–144)

## 2017-04-02 LAB — PROTEIN / CREATININE RATIO, URINE
Creatinine, Urine: 155.3 mg/dL
PROTEIN UR: 41 mg/dL
PROTEIN/CREAT RATIO: 264 mg/g{creat} — AB (ref 0–200)

## 2017-04-02 LAB — CREATINE: Creatine, Serum: 1 mg/dL (ref 0.1–1.0)

## 2017-04-02 LAB — URIC ACID: Uric Acid: 5.2 mg/dL (ref 2.5–7.1)

## 2017-04-04 ENCOUNTER — Encounter: Payer: Self-pay | Admitting: Certified Nurse Midwife

## 2017-04-04 ENCOUNTER — Ambulatory Visit (INDEPENDENT_AMBULATORY_CARE_PROVIDER_SITE_OTHER): Payer: BLUE CROSS/BLUE SHIELD | Admitting: Certified Nurse Midwife

## 2017-04-04 VITALS — BP 131/92 | HR 82 | Wt 225.4 lb

## 2017-04-04 DIAGNOSIS — Z5189 Encounter for other specified aftercare: Secondary | ICD-10-CM

## 2017-04-05 NOTE — Progress Notes (Signed)
    OBSTETRICS/GYNECOLOGY POST-OPERATIVE CLINIC VISIT  Subjective:     Priscilla King is a 41 y.o. female who presents to the clinic 2 weeks status post repeat cesarean section.   Eating a regular diet without difficulty. Bowel movements are normal. The patient is not having any pain.  Patient has been checking blood pressure at home all values have been within normal limits.   Denies difficulty breathing or respiratory distress, chest pain, abdominal pain, excessive vaginal bleeding, dysuria, and leg pain or swelling.   The following portions of the patient's history were reviewed and updated as appropriate: allergies, current medications, past family history, past medical history, past social history, past surgical history and problem list.  Review of Systems  Pertinent items are noted in HPI.    Objective:    BP (!) 131/92   Pulse 82   Wt 225 lb 6.4 oz (102.2 kg)   LMP 06/21/2016   BMI 34.27 kg/m    General:  alert and no distress  Abdomen: soft, bowel sounds active, non-tender  Incision:   Left side of incision well approximated. Right side healing slowly, no signs of infection, approximately one (1) to two (2) inch opening-sealed with Dermabond    Assessment:    Doing well postoperatively. Slow healing incision, approximated with dermabond   Plan:   1. Continue any current medications. 2. Wound care discussed and use of postpartum undergarments 3. Activity restrictions reviewed 4. RTC x 1 week for incision check with Dr. Larrie Kass Dani Gobble, CNM [facility]

## 2017-04-08 ENCOUNTER — Ambulatory Visit (INDEPENDENT_AMBULATORY_CARE_PROVIDER_SITE_OTHER): Payer: BLUE CROSS/BLUE SHIELD | Admitting: Certified Nurse Midwife

## 2017-04-08 ENCOUNTER — Encounter: Payer: Self-pay | Admitting: Certified Nurse Midwife

## 2017-04-08 VITALS — BP 138/90 | HR 89 | Wt 222.2 lb

## 2017-04-08 DIAGNOSIS — Z4889 Encounter for other specified surgical aftercare: Secondary | ICD-10-CM

## 2017-04-08 NOTE — Progress Notes (Signed)
    OBSTETRICS/GYNECOLOGY POST-OPERATIVE CLINIC VISIT  Subjective:     Priscilla King is a 41 y.o. female who presents to the clinic 3 weeks status post cesarean section  . Eating a regular diet without difficulty. Bowel movements are normal. Pain is controlled without any medications.  The following portions of the patient's history were reviewed and updated as appropriate: allergies, current medications, past family history, past medical history, past social history, past surgical history and problem list.  She denies any fever or discharge for incision site. She does complain of some burning and tugging at the site.   Review of Systems Pertinent items are noted in HPI.    Objective:    BP 138/90   Pulse 89   Wt 222 lb 3 oz (100.8 kg)   BMI 33.78 kg/m  General:  alert and no distress  Abdomen: soft, bowel sounds active, non-tender  Incision:   healing well, no drainage, no erythema, no hernia, no seroma, no swelling, incision well approximate on left side. Right side small jap of pink tissue noted with no redness, swelling or draining. Dermabond is peeling from skin and is causing some tugging/burning  where the skin was glued together.      Assessment:    Doing well postoperatively. On examination perspiration noted when panis pulled back from incision. The incision was cleaned with sterile saline and peroxide, remainder of Dermabond cleaned from skin. Pt instructed on trying to keep area dry. Recommended that she lay down and expose the skin to air a few times a day. No signs of infection at this time.    Plan:   1. Continue any current medications. 2. Wound care discussed. 3. Activity restrictions: none  4..Follow up: 1 week with MD     Philip Aspen, CNM [facility]

## 2017-04-08 NOTE — Progress Notes (Signed)
Pt is here for an incision check.States it is burning and feels like the skin is tearing. Also oozing yellow-brown discharge.

## 2017-04-08 NOTE — Patient Instructions (Signed)

## 2017-04-11 ENCOUNTER — Encounter: Payer: BLUE CROSS/BLUE SHIELD | Admitting: Obstetrics and Gynecology

## 2017-04-12 ENCOUNTER — Encounter: Payer: BLUE CROSS/BLUE SHIELD | Admitting: Obstetrics and Gynecology

## 2017-04-16 ENCOUNTER — Encounter: Payer: BLUE CROSS/BLUE SHIELD | Admitting: Obstetrics and Gynecology

## 2017-04-22 ENCOUNTER — Encounter: Payer: BLUE CROSS/BLUE SHIELD | Admitting: Obstetrics and Gynecology

## 2017-04-26 ENCOUNTER — Encounter: Payer: Self-pay | Admitting: Obstetrics and Gynecology

## 2017-04-26 ENCOUNTER — Ambulatory Visit (INDEPENDENT_AMBULATORY_CARE_PROVIDER_SITE_OTHER): Payer: BLUE CROSS/BLUE SHIELD | Admitting: Obstetrics and Gynecology

## 2017-04-26 DIAGNOSIS — Z23 Encounter for immunization: Secondary | ICD-10-CM

## 2017-04-26 NOTE — Progress Notes (Addendum)
   OBSTETRICS POSTPARTUM CLINIC PROGRESS NOTE  Subjective:     Priscilla King is a 41 y.o. [id number] female who presents for a postpartum visit. She is 5 weeks postpartum following a repeat low cervical transverse Cesarean section. I have fully reviewed the prenatal and intrapartum course, her pregnancy was complicated by Type II DM and advanced maternal age. The delivery was at 38.6 gestational weeks.  Anesthesia: spinal. Postpartum course has been well. Baby's course has been well. Baby is feeding by bottle. Bleeding: patient has not resumed menses, with No LMP recorded (lmp unknown).. Bowel function is normal. Bladder function is normal. Patient is not sexually active. Contraception method desired is vasectomy. Postpartum depression screening: negative (EDPH score of 1).   The following portions of the patient's history were reviewed and updated as appropriate: allergies, current medications, past family history, past medical history, past social history, past surgical history and problem list.   Review of Systems Pertinent items noted in HPI and remainder of comprehensive ROS otherwise negative.   Objective:    BP 111/75   Pulse 76   Ht 5\' 8"  (1.727 m)   Wt 218 lb 9.6 oz (99.2 kg)   LMP  (LMP Unknown)   Breastfeeding? No   BMI 33.24 kg/m   General:  alert and no distress   Breasts:  inspection negative, no nipple discharge or bleeding, no masses or nodularity palpable  Lungs: clear to auscultation bilaterally  Heart:  regular rate and rhythm, S1, S2 normal, no murmur, click, rub or gallop  Abdomen: soft, non-tender; bowel sounds normal; no masses,  no organomegaly.  Well healed Pfannenstiel incision   Vulva:  normal  Vagina: normal vagina, no discharge, exudate, lesion, or erythema  Cervix:  no cervical motion tenderness and no lesions  Corpus: normal size, contour, position, consistency, mobility, non-tender  Adnexa:  normal adnexa and no mass, fullness, tenderness  Rectal  Exam: Not performed.         Labs:  Lab Results  Component Value Date   HGB 10.6 (L) 03/21/2017     Assessment:   Routine postpartum exam s/p cesarean delivery.   Need for flu vaccine  Plan:   1. Contraception: vasectomy .  Will use condoms during interim.  2. Can resume all activities, including work and intercourse.  3. Flu vaccine given today. 4. Follow up in: 5 months for annual exam, or as needed.    Rubie Maid, MD [facility]

## 2017-04-26 NOTE — Patient Instructions (Signed)

## 2017-06-21 LAB — HM DIABETES EYE EXAM

## 2017-09-26 ENCOUNTER — Encounter: Payer: BLUE CROSS/BLUE SHIELD | Admitting: Obstetrics and Gynecology

## 2017-10-07 ENCOUNTER — Other Ambulatory Visit (INDEPENDENT_AMBULATORY_CARE_PROVIDER_SITE_OTHER): Payer: BLUE CROSS/BLUE SHIELD

## 2017-10-07 DIAGNOSIS — Z0000 Encounter for general adult medical examination without abnormal findings: Secondary | ICD-10-CM

## 2017-10-07 LAB — COMPREHENSIVE METABOLIC PANEL
ALBUMIN: 4.2 g/dL (ref 3.5–5.2)
ALK PHOS: 84 U/L (ref 39–117)
ALT: 17 U/L (ref 0–35)
AST: 15 U/L (ref 0–37)
BILIRUBIN TOTAL: 0.3 mg/dL (ref 0.2–1.2)
BUN: 12 mg/dL (ref 6–23)
CO2: 26 mEq/L (ref 19–32)
CREATININE: 0.6 mg/dL (ref 0.40–1.20)
Calcium: 9.1 mg/dL (ref 8.4–10.5)
Chloride: 102 mEq/L (ref 96–112)
GFR: 141.23 mL/min (ref >60.00)
GLUCOSE: 163 mg/dL — AB (ref 70–99)
Potassium: 4 mEq/L (ref 3.5–5.1)
SODIUM: 137 meq/L (ref 135–145)
TOTAL PROTEIN: 7.7 g/dL (ref 6.0–8.3)

## 2017-10-07 LAB — CBC WITH DIFFERENTIAL/PLATELET
BASOS ABS: 0 10*3/uL (ref 0.0–0.1)
Basophils Relative: 0.5 % (ref 0.0–3.0)
EOS PCT: 2.2 % (ref 0.0–5.0)
Eosinophils Absolute: 0.1 10*3/uL (ref 0.0–0.7)
HCT: 39.1 % (ref 36.0–46.0)
HEMOGLOBIN: 12.9 g/dL (ref 12.0–15.0)
Lymphocytes Relative: 39.5 % (ref 12.0–46.0)
Lymphs Abs: 2.1 10*3/uL (ref 0.7–4.0)
MCHC: 32.9 g/dL (ref 30.0–36.0)
MCV: 83.3 fl (ref 78.0–100.0)
MONO ABS: 0.4 10*3/uL (ref 0.1–1.0)
Monocytes Relative: 7.8 % (ref 3.0–12.0)
NEUTROS PCT: 50 % (ref 43.0–77.0)
Neutro Abs: 2.7 10*3/uL (ref 1.4–7.7)
Platelets: 287 10*3/uL (ref 150.0–400.0)
RBC: 4.7 Mil/uL (ref 3.87–5.11)
RDW: 14.4 % (ref 11.5–15.5)
WBC: 5.3 10*3/uL (ref 4.0–10.5)

## 2017-10-07 LAB — LIPID PANEL
CHOLESTEROL: 171 mg/dL (ref 0–200)
HDL: 60.4 mg/dL (ref >39.00)
LDL Cholesterol: 97 mg/dL (ref 0–99)
NONHDL: 110.35
Total CHOL/HDL Ratio: 3
Triglycerides: 66 mg/dL (ref 0.0–149.0)
VLDL: 13.2 mg/dL (ref 0.0–40.0)

## 2017-10-07 LAB — HEPATIC FUNCTION PANEL
ALT: 17 U/L (ref 0–35)
AST: 15 U/L (ref 0–37)
Albumin: 4.2 g/dL (ref 3.5–5.2)
Alkaline Phosphatase: 84 U/L (ref 39–117)
BILIRUBIN DIRECT: 0 mg/dL (ref 0.0–0.3)
BILIRUBIN TOTAL: 0.3 mg/dL (ref 0.2–1.2)
Total Protein: 7.7 g/dL (ref 6.0–8.3)

## 2017-10-07 LAB — TSH: TSH: 2.44 u[IU]/mL (ref 0.35–4.50)

## 2017-10-07 LAB — HEMOGLOBIN A1C: Hgb A1c MFr Bld: 7.3 % — ABNORMAL HIGH (ref 4.6–6.5)

## 2017-10-14 ENCOUNTER — Ambulatory Visit (INDEPENDENT_AMBULATORY_CARE_PROVIDER_SITE_OTHER): Payer: BLUE CROSS/BLUE SHIELD | Admitting: Family Medicine

## 2017-10-14 ENCOUNTER — Other Ambulatory Visit: Payer: Self-pay

## 2017-10-14 ENCOUNTER — Telehealth: Payer: Self-pay | Admitting: *Deleted

## 2017-10-14 ENCOUNTER — Encounter: Payer: Self-pay | Admitting: Family Medicine

## 2017-10-14 ENCOUNTER — Encounter: Payer: Self-pay | Admitting: Obstetrics and Gynecology

## 2017-10-14 ENCOUNTER — Ambulatory Visit (INDEPENDENT_AMBULATORY_CARE_PROVIDER_SITE_OTHER): Payer: BLUE CROSS/BLUE SHIELD | Admitting: Obstetrics and Gynecology

## 2017-10-14 VITALS — BP 120/82 | HR 80 | Temp 98.3°F | Ht 66.0 in | Wt 248.5 lb

## 2017-10-14 VITALS — BP 126/83 | HR 78 | Ht 68.0 in | Wt 250.1 lb

## 2017-10-14 DIAGNOSIS — E1165 Type 2 diabetes mellitus with hyperglycemia: Secondary | ICD-10-CM

## 2017-10-14 DIAGNOSIS — IMO0001 Reserved for inherently not codable concepts without codable children: Secondary | ICD-10-CM

## 2017-10-14 DIAGNOSIS — Z01419 Encounter for gynecological examination (general) (routine) without abnormal findings: Secondary | ICD-10-CM

## 2017-10-14 DIAGNOSIS — E119 Type 2 diabetes mellitus without complications: Secondary | ICD-10-CM | POA: Diagnosis not present

## 2017-10-14 DIAGNOSIS — I10 Essential (primary) hypertension: Secondary | ICD-10-CM | POA: Diagnosis not present

## 2017-10-14 DIAGNOSIS — Z0000 Encounter for general adult medical examination without abnormal findings: Secondary | ICD-10-CM

## 2017-10-14 DIAGNOSIS — Z23 Encounter for immunization: Secondary | ICD-10-CM

## 2017-10-14 DIAGNOSIS — F53 Postpartum depression: Secondary | ICD-10-CM | POA: Insufficient documentation

## 2017-10-14 DIAGNOSIS — E282 Polycystic ovarian syndrome: Secondary | ICD-10-CM

## 2017-10-14 DIAGNOSIS — Z1231 Encounter for screening mammogram for malignant neoplasm of breast: Secondary | ICD-10-CM | POA: Diagnosis not present

## 2017-10-14 DIAGNOSIS — O99345 Other mental disorders complicating the puerperium: Secondary | ICD-10-CM

## 2017-10-14 DIAGNOSIS — E669 Obesity, unspecified: Secondary | ICD-10-CM

## 2017-10-14 MED ORDER — DICLOFENAC SODIUM 1 % TD GEL: 2.0000 g | Freq: Four times a day (QID) | TRANSDERMAL | 5 refills | Status: DC

## 2017-10-14 MED ORDER — SERTRALINE HCL 50 MG PO TABS: 50.0000 mg | ORAL_TABLET | Freq: Every day | ORAL | 3 refills | Status: DC

## 2017-10-14 MED ORDER — MELOXICAM 15 MG PO TABS: 15.0000 mg | ORAL_TABLET | Freq: Every day | ORAL | 3 refills | Status: AC

## 2017-10-14 NOTE — Progress Notes (Signed)
Pt is doing well no concerns.

## 2017-10-14 NOTE — Telephone Encounter (Signed)
Received fax from [facility] requesting PA for Diclofenac Gel 1%.  PA completed on CoverMyMeds.  Sent to [facility] for review.  Can take up to 72 hours for a response.

## 2017-10-14 NOTE — Progress Notes (Signed)
GYNECOLOGY ANNUAL PHYSICAL EXAM PROGRESS NOTE  Subjective:    Priscilla King is a 42 y.o. G22P2012 female who presents for an annual exam. The patient has no complaints today. The patient is sexually active. The patient wears seatbelts: yes. The patient participates in regular exercise: no (due to complaints of arthritis). Has the patient ever been transfused or tattooed?: no. The patient reports that there is not domestic violence in her life.    Gynecologic History  Menarche age: 54 Patient's last menstrual period was 09/23/2017. Contraception: vasectomy History of STI's: Denies Last Pap: 08/2016. Results were: normal.  Denies h/o abnormal pap smears. Last mammogram: patient reports one years ago, normal, but none recent.     OB History  Gravida Para Term Preterm AB Living  3 2 2  0 1 2  SAB TAB Ectopic Multiple Live Births  0 1 0 0 2    # Outcome Date GA Lbr Len/2nd Weight Sex Delivery Anes PTL Lv  3 Term 03/20/17 [redacted]w[redacted]d  10 lb 9 oz (4.79 kg) F CS-Vac Spinal  LIV     Name: Byrum,[name]     Apgar1: 8  Apgar5: 9  2 Term 08/31/12 [redacted]w[redacted]d  8 lb 12.4 oz (3.98 kg) M CS-LVertical EPI  LIV     Name: Ludwig,BOY Omya     Apgar1: 8  Apgar5: 9  1 TAB 1998    U    DEC     Birth Comments: Elective AB     Past Medical History:  Diagnosis Date  . Diabetes mellitus type 2, uncontrolled, without complications ([place]) 16/1096  . History of polycystic ovaries   . Hyperlipidemia LDL goal <70 06/2014  . Kienbock's avascular necrosis of lunate, adult   . Obesity   . Seronegative arthritis    possible, [place], [place]    Past Surgical History:  Procedure Laterality Date  . CESAREAN SECTION N/A 08/31/2012   Procedure: CESAREAN SECTION;  Surgeon: Mora Bellman, MD;  Location: [facility];  Service: Obstetrics;  Laterality: N/A;  . CESAREAN SECTION N/A 03/20/2017   Procedure: REPEAT CESAREAN SECTION;  Surgeon: Rubie Maid, MD;  Location: [facility];  Service: Obstetrics;  Laterality: N/A;   Female born @ 1500 Apgars: 8/9 Weight:10lbs 9ozs    Family History  Problem Relation Age of Onset  . Stroke Mother        CVA (TIA)  . Diabetes Mother   . Diabetes Maternal Grandmother   . Hypertension Maternal Grandmother   . Diabetes Paternal Grandmother   . Hypertension Paternal Grandmother     Social History   Socioeconomic History  . Marital status: Married    Spouse name: Not on file  . Number of children: 1  . Years of education: Not on file  . Highest education level: Not on file  Occupational History  . 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 Financial resource strain: Not on file  . Food insecurity:    Worry: Not on file    Inability: Not on file  . Transportation needs:    Medical: Not on file    Non-medical: Not on file  Tobacco Use  . Smoking status: Never Smoker  . Smokeless tobacco: Never Used  Substance and Sexual Activity  . Alcohol use: No  . Drug use: No  . Sexual activity: Yes    Birth control/protection:  None    Comment: IUD removed in Oct 2017  Lifestyle  . Physical activity:    Days per week: Not on file    Minutes per session: Not on file  . Stress: Not on file  Relationships  . Social connections:    Talks on phone: Not on file    Gets together: Not on file    Attends religious service: Not on file    Active member of club or organization: Not on file    Attends meetings of clubs or organizations: Not on file    Relationship status: Not on file  . Intimate partner violence:    Fear of current or ex partner: Not on file    Emotionally abused: Not on file    Physically abused: Not on file    Forced sexual activity: Not on file  Other Topics Concern  . Not on file  Social History Narrative   Married, Ritson   [place]   No exercise- knee   Professor of Math at [employer]    Outpatient Encounter Medications as of 10/14/2017    Medication Sig  . [DISCONTINUED] ketoconazole (NIZORAL) 2 % shampoo APPLICATIONS APPLY ON THE SKIN APPLY TO SCALP AND LET SIT SEVERAL MINS BEFORE RINSING AS NEEDED FOR SCALP PSORIASIS   No facility-administered encounter medications on file as of 10/14/2017.      Allergies  Allergen Reactions  . Vicodin [Hydrocodone-Acetaminophen] Swelling    Tongue swelling and headache      Review of Systems Constitutional: negative for chills, fatigue, fevers and sweats Eyes: negative for irritation, redness and visual disturbance Ears, nose, mouth, throat, and face: negative for hearing loss, nasal congestion, snoring and tinnitus Respiratory: negative for asthma, cough, sputum Cardiovascular: negative for chest pain, dyspnea, exertional chest pressure/discomfort, irregular heart beat, palpitations and syncope Gastrointestinal: negative for abdominal pain, change in bowel habits, nausea and vomiting Genitourinary: negative for abnormal menstrual periods, genital lesions, sexual problems and vaginal discharge, dysuria and urinary incontinence Integument/breast: negative for breast lump, and nipple discharge. Positive for occasional right breast tenderness and dryness.  Is using Lanolin cream.  Hematologic/lymphatic: negative for bleeding and easy bruising Musculoskeletal:negative for back pain and muscle weakness.  Positive for arthritic pain.  Neurological: negative for dizziness, headaches, vertigo and weakness Endocrine: negative for diabetic symptoms including polydipsia, polyuria and skin dryness Allergic/Immunologic: negative for hay fever and urticaria    Psychological: Positive for - anxiety, depression and irritability (patient notes an episode approximately 2 months ago where she was experiencing depressed mood, overeating with weight gain, and was not maintaining some ADL's.  Notes that she has been able to "pull herself out of it" and is doing somewhat better.  Negative for -  concentration difficulties, obsessive thoughts, sleep disturbances or suicidal ideation    Objective:  Blood pressure 126/83, pulse 78, height 5\' 8"  (1.727 m), weight 250 lb 1.6 oz (113.4 kg), last menstrual period 09/23/2017, not currently breastfeeding. Body mass index is 38.03 kg/m.  General Appearance:    Alert, cooperative, no distress, appears stated age  Head:    Normocephalic, without obvious abnormality, atraumatic  Eyes:    PERRL, conjunctiva/corneas clear, EOM's intact, both eyes  Ears:    Normal external ear canals, both ears  Nose:   Nares normal, septum midline, mucosa normal, no drainage or sinus tenderness  Throat:   Lips, mucosa, and tongue normal; teeth and gums normal  Neck:   Supple, symmetrical, trachea midline, no adenopathy; thyroid: no enlargement/tenderness/nodules; no carotid  bruit or JVD  Back:     Symmetric, no curvature, ROM normal, no CVA tenderness  Lungs:     Clear to auscultation bilaterally, respirations unlabored  Chest Wall:    No tenderness or deformity   Heart:    Regular rate and rhythm, S1 and S2 normal, no murmur, rub or gallop  Breast Exam:    No tenderness, masses, or nipple abnormality. Few small papillary hyperpigmented areas around areola.   Abdomen:     Soft, non-tender, bowel sounds active all four quadrants, no masses, no organomegaly. Well healed Pfannenstiel incision   Genitalia:    Pelvic:external genitalia normal, vagina without lesions, discharge, or tenderness, rectovaginal septum  normal. Cervix normal in appearance, no cervical motion tenderness, no adnexal masses or tenderness.  Uterus normal size, shape, mobile, regular contours, nontender.  Rectal:    Normal external sphincter.  No hemorrhoids appreciated. Internal exam not done.   Extremities:   Extremities normal, atraumatic, no cyanosis or edema  Pulses:   2+ and symmetric all extremities  Skin:   Skin color, texture, turgor normal, no rashes or lesions  Lymph nodes:   Cervical,  supraclavicular, and axillary nodes normal  Neurologic:   CNII-XII intact, normal strength, sensation and reflexes throughout    Labs:  Lab Results  Component Value Date   WBC 5.3 10/07/2017   HGB 12.9 10/07/2017   HCT 39.1 10/07/2017   MCV 83.3 10/07/2017   PLT 287.0 10/07/2017    Lab Results  Component Value Date   CREATININE 0.60 10/07/2017   BUN 12 10/07/2017   NA 137 10/07/2017   K 4.0 10/07/2017   CL 102 10/07/2017   CO2 26 10/07/2017    Lab Results  Component Value Date   ALT 17 10/07/2017   ALT 17 10/07/2017   AST 15 10/07/2017   AST 15 10/07/2017   ALKPHOS 84 10/07/2017   ALKPHOS 84 10/07/2017   BILITOT 0.3 10/07/2017   BILITOT 0.3 10/07/2017     Lab Results  Component Value Date   TSH 2.44 10/07/2017     Lab Results  Component Value Date   CHOL 171 10/07/2017   HDL 60.40 10/07/2017   LDLCALC 97 10/07/2017   TRIG 66.0 10/07/2017   CHOLHDL 3 10/07/2017    Lab Results  Component Value Date   HGBA1C 7.3 (H) 10/07/2017    Assessment:     Well woman exam with routine gynecological exam Breast cancer screening by mammogram  Mild postpartum depression Essential hypertension Obesity (BMI 30-39.9) PCOS (polycystic ovarian syndrome) Type 2 diabetes mellitus without complication, without long-term current use of insulin ([place])    Plan:     Blood tests: not performed. Had labs recently done by PCP office. Has appointment with PCP later today for review. Breast self exam technique reviewed and patient encouraged to perform self-exam monthly.  Discussed that breast changes were normal, not signs of dryness, but due to hormonal changes during and after pregnancy.  Contraception: vasectomy. Discussed healthy lifestyle modifications. Mammogram ordered.  Pap smear up to date.  Next due in 2021.   HTN and Diabetes managed by PCP.  PCOS likely plays a role with patient's HTN and Diabetes.  Encouraged continue management. If cycles become irregular can  be initiated on a progesterone contraceptive.  Mild postpartum depression with episode ~ 2 months ago.  Patient currently 6 months postpartum.  Discussed that postpartum depression can occur within the 12 month period after childbirth.  Patient notes that she initially  did not recognize the signs, and then when she did realize it, she tried to "talk herself through her symptoms" as she was not experiencing anything major like SI/HI.  Encouraged patient not to delay in seeking help when needed. Discussed options for counseling, medications, natural remedies (exercise, dietary supplements) or a combination of therapies.  Patient is trying natural methods currently, notes that they are helping. PHQ-9 score today is 11.  Notes that she knows she will get better once she is able to return back to work and have a normal routine as she is not the "stay at home" type. Will continue to follow up.  - Follow up in 1 year, or sooner as needed.    Rubie Maid, MD [facility]     Rubie Maid, MD [facility]

## 2017-10-14 NOTE — Progress Notes (Signed)
Dr. Frederico Hamman T. Quaid Yeakle, MD, [facility] [place], [address] Phone: [phone] Fax: [phone]  10/14/2017  Patient: Priscilla King, MRN: [id number], DOB: [birth year], 42 y.o.  Primary Physician:  Owens Loffler, MD   Chief Complaint  Patient presents with  . Annual Exam   Subjective:   Priscilla King is a 42 y.o. pleasant patient who presents with the following:  Health Maintenance Summary Reviewed and updated, unless pt declines services.  Tobacco History Reviewed. Non-smoker Alcohol: No concerns, no excessive use Exercise Habits: now, rare activity, rec at least 30 mins 5 times a week STD concerns: none Drug Use: None Menses regular: yes Lumps or breast concerns: no Breast Cancer Family History: no  Health Maintenance  Topic Date Due  . OPHTHALMOLOGY EXAM  07/20/2015  . URINE MICROALBUMIN  10/25/2015  . HEMOGLOBIN A1C  04/09/2018  . FOOT EXAM  10/15/2018  . PAP SMEAR  09/11/2019  . PNEUMOCOCCAL POLYSACCHARIDE VACCINE (2) 10/15/2022  . TETANUS/TDAP  01/18/2027  . INFLUENZA VACCINE  Completed  . HIV Screening  Completed    Immunization History  Administered Date(s) Administered  . Influenza Split 06/05/2012  . Influenza,inj,Quad PF,6+ Mos 04/26/2017  . Pneumococcal Conjugate-13 07/19/2014  . Pneumococcal Polysaccharide-23 10/14/2017  . Tdap 06/05/2012, 01/17/2017   Patient Active Problem List   Diagnosis Date Noted  . Mild postpartum depression 10/14/2017  . Obesity 11/01/2016  . Uterine fibroid 08/29/2016  . Hyperlipidemia LDL goal <70   . Kienbock's avascular necrosis of lunate, adult   . Diabetes mellitus type 2, uncontrolled, without complications ([place]) 87/56/4332  . PCOS (polycystic ovarian syndrome) 04/15/2012  . OTHER SPECIFIED FORMS OF HEARING LOSS 03/10/2009  . PATELLO-FEMORAL SYNDROME 04/16/2008   Past Medical History:  Diagnosis Date  . Diabetes mellitus type 2,  uncontrolled, without complications ([place]) 95/1884  . History of polycystic ovaries   . Hyperlipidemia LDL goal <70 06/2014  . Kienbock's avascular necrosis of lunate, adult   . Obesity   . Seronegative arthritis    possible, [place], [place]   Past Surgical History:  Procedure Laterality Date  . CESAREAN SECTION N/A 08/31/2012   Procedure: CESAREAN SECTION;  Surgeon: Mora Bellman, MD;  Location: [place] [facility];  Service: Obstetrics;  Laterality: N/A;  . CESAREAN SECTION N/A 03/20/2017   Procedure: REPEAT CESAREAN SECTION;  Surgeon: Rubie Maid, MD;  Location: [facility];  Service: Obstetrics;  Laterality: N/A;  Female born @ 1500 Apgars: 8/9 Weight:10lbs 9ozs   Social History   Socioeconomic History  . Marital status: Married    Spouse name: Not on file  . Number of children: 1  . Years of education: Not on file  . Highest education level: Not on file  Occupational History  . 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 Financial resource strain: Not on file  . Food insecurity:    Worry: Not on file    Inability: Not on file  . Transportation needs:    Medical: Not on file    Non-medical: Not on file  Tobacco Use  . Smoking status: Never Smoker  . Smokeless tobacco: Never Used  Substance and Sexual Activity  . Alcohol use: No  . Drug use: No  . Sexual activity: Yes    Birth control/protection: None    Comment: IUD removed in Oct 2017  Lifestyle  . Physical activity:    Days per week: Not on file    Minutes per session: Not on file  . Stress: Not on file  Relationships  . Social connections:    Talks on phone: Not on file    Gets together: Not on file    Attends religious service: Not on file    Active member of club or organization: Not on file    Attends meetings of clubs or organizations: Not on file    Relationship status: Not on file  . Intimate partner violence:    Fear of  current or ex partner: Not on file    Emotionally abused: Not on file    Physically abused: Not on file    Forced sexual activity: Not on file  Other Topics Concern  . 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 Stroke Mother        CVA (TIA)  . Diabetes Mother   . Diabetes Maternal Grandmother   . Hypertension Maternal Grandmother   . Diabetes Paternal Grandmother   . Hypertension Paternal Grandmother    Allergies  Allergen Reactions  . Vicodin [Hydrocodone-Acetaminophen] Swelling    Tongue swelling and headache    Medication list has been reviewed and updated.   General: Denies fever, chills, sweats. No significant weight loss. Eyes: Denies blurring,significant itching ENT: Denies earache, sore throat, and hoarseness.  Cardiovascular: Denies chest pains, palpitations, dyspnea on exertion,  Respiratory: Denies cough, dyspnea at rest,wheeezing Breast: no concerns about lumps GI: Denies nausea, vomiting, diarrhea, constipation, change in bowel habits, abdominal pain, melena, hematochezia GU: Denies dysuria, hematuria, urinary hesitancy, nocturia, denies STD risk, no concerns about discharge Musculoskeletal: Denies back pain, joint pain Derm: Denies rash, itching Neuro: Denies  paresthesias, frequent falls, frequent headaches Psych: SOME DEPRESSION AND ANHEDONIA, APPROX 60% FUNCTION NOW, NO SI, HI, OR GUILT.  Endocrine: Denies cold intolerance, heat intolerance, polydipsia Heme: Denies enlarged lymph nodes Allergy: No hayfever  Objective:   BP 120/82   Pulse 80   Temp 98.3 F (36.8 C) (Oral)   Ht 5' 6" (1.676 m)   Wt 248 lb 8 oz (112.7 kg)   LMP 09/23/2017   BMI 40.11 kg/m  No exam data present  GEN: well developed, well nourished, no acute distress Eyes: conjunctiva and lids normal, PERRLA, EOMI ENT: TM clear, nares clear, oral exam  WNL Neck: supple, no lymphadenopathy, no thyromegaly, no JVD Pulm: clear to auscultation and percussion, respiratory effort normal CV: regular rate and rhythm, S1-S2, no murmur, rub or gallop, no bruits Chest: no scars, masses, no lumps BREAST: breast exam declined GI: soft, non-tender; no hepatosplenomegaly, masses; active bowel sounds all quadrants GU: GU exam declined Lymph: no cervical, axillary or inguinal adenopathy MSK: gait normal, muscle tone and strength WNL, no joint swelling, effusions, discoloration, crepitus  SKIN: clear, good turgor, color WNL, no rashes, lesions, or ulcerations Neuro: normal mental status, normal strength, sensation, and motion Psych: alert; oriented to person, place and time, normally interactive and not anxious or depressed in appearance.   All labs reviewed with patient. Lipids:    Component Value Date/Time   CHOL 171 10/07/2017 1141   TRIG 66.0 10/07/2017 1141   HDL 60.40 10/07/2017 1141   VLDL 13.2 10/07/2017 1141   CHOLHDL 3 10/07/2017 1141     CBC: CBC Latest Ref Rng & Units 10/07/2017 03/21/2017 03/20/2017  WBC 4.0 - 10.5 K/uL 5.3 10.8 8.0  Hemoglobin 12.0 - 15.0 g/dL 12.9 10.6(L) 11.4(L)  Hematocrit 36.0 - 46.0 % 39.1 30.9(L) 33.9(L)  Platelets 150.0 - 400.0 K/uL 287.0 176 201    Basic Metabolic Panel:    Component Value Date/Time   NA 137 10/07/2017 1141   NA 137 03/28/2017 1709   NA 139 01/05/2012 1716   K 4.0 10/07/2017 1141   K 3.5 01/05/2012 1716   CL 102 10/07/2017 1141   CL 109 (H) 01/05/2012 1716   CO2 26 10/07/2017 1141   CO2 21 01/05/2012 1716   BUN 12 10/07/2017 1141   BUN 8 03/28/2017 1709   BUN 7 01/05/2012 1716   CREATININE 0.60 10/07/2017 1141   CREATININE 0.68 01/05/2012 1716   GLUCOSE 163 (H) 10/07/2017 1141   GLUCOSE 66 (L) 05/05/2012 1305   CALCIUM 9.1 10/07/2017 1141   CALCIUM 9.2 01/05/2012 1716   Hepatic Function Latest Ref Rng & Units 10/07/2017 10/07/2017 03/28/2017  Total Protein 6.0 - 8.3 g/dL 7.7 7.7  6.4  Albumin 3.5 - 5.2 g/dL 4.2 4.2 3.3(L)  AST 0 - 37 U/L 15 15 24  ALT 0 - 35 U/L 17 17 52(H)  Alk Phosphatase 39 - 117 U/L 84 84 107  Total Bilirubin 0.2 - 1.2 mg/dL 0.3 0.3 0.3  Bilirubin, Direct 0.0 - 0.3 mg/dL 0.0 - 0.09    Lab Results  Component Value Date   TSH 2.44 10/07/2017   No results found.  Assessment and Plan:   Healthcare maintenance  Need for prophylactic vaccination against Streptococcus pneumoniae (pneumococcus) - Plan: Pneumococcal polysaccharide vaccine 23-valent greater than or equal to 2yo subcutaneous/IM  3-4 month course of zoloft with taper off if doing well. Restart NSAIDS Work on weight loss.  Health Maintenance Exam: The patient's preventative maintenance and recommended screening tests for an annual wellness exam were reviewed in full today. Brought up to date unless services declined.  Counselled on the importance of diet, exercise, and its role in overall health and mortality. The patient's FH and SH was reviewed, including their home life, tobacco status, and drug and alcohol status.  Follow-up in 1 year for physical exam or additional follow-up below.  Follow-up: No follow-ups on file. Or follow-up in 1 year if not noted.  No future appointments.  Meds ordered this encounter  Medications  . meloxicam (MOBIC) 15 MG tablet    Sig: Take 1 tablet (15 mg total) by mouth daily.    Dispense:  90 tablet    Refill:  3  . sertraline (ZOLOFT) 50 MG tablet    Sig: Take 1 tablet (50 mg total) by mouth daily.    Dispense:  30 tablet    Refill:  3  . diclofenac sodium (VOLTAREN) 1 % GEL    Sig: Apply 2 g topically 4 (four) times daily.    Dispense:  3 Tube    Refill:  5   There are no discontinued medications. Orders Placed This Encounter  Procedures  . Pneumococcal polysaccharide vaccine 23-valent greater than or equal to 2yo subcutaneous/IM    Signed,   T. , MD   Allergies as of 10/14/2017      Reactions   Vicodin  [hydrocodone-acetaminophen] Swelling   Tongue swelling and headache      Medication List        Accurate as of 10/14/17 11:59 PM. Always use your most recent med   list.          diclofenac sodium 1 % Gel Commonly known as:  VOLTAREN Apply 2 g topically 4 (four) times daily.   meloxicam 15 MG tablet Commonly known as:  MOBIC Take 1 tablet (15 mg total) by mouth daily.   sertraline 50 MG tablet Commonly known as:  ZOLOFT Take 1 tablet (50 mg total) by mouth daily.          

## 2017-10-14 NOTE — Patient Instructions (Signed)

## 2017-10-16 NOTE — Telephone Encounter (Signed)
Left message for Abie that there is not a good alternative for the Diclofenac Gel.  I advised she could pay out of pocket.  She can go to web site [employer] and print off coupon to take to [facility] and it will cost her $21.63 for 1 tube or $46.89 for 3 tubes.  I advised that she call me back if she has any questions.

## 2017-10-16 NOTE — Telephone Encounter (Signed)
PA denied.  States is was denied because patient is also on an oral anti-inflammatory medication meloxicam.  States it is covered when not used with oral anti-inflammatory.  Received fax from [facility] stating patient is requesting alternative since PA was denied.  Please advise.

## 2017-10-16 NOTE — Telephone Encounter (Signed)
There really is not a good alternative - she can potentially pay for it out of pocket. I could also try to get a compounding pharmacy to make a similar product for her.   The issue is taking combined oral and topical NSAIDS. The insurance companies typically deny this.

## 2017-11-11 ENCOUNTER — Ambulatory Visit
Admission: RE | Admit: 2017-11-11 | Discharge: 2017-11-11 | Disposition: A | Discharge status: Home / Self Care | Payer: BLUE CROSS/BLUE SHIELD | Source: Ambulatory Visit | Attending: Obstetrics and Gynecology | Admitting: Obstetrics and Gynecology

## 2017-11-11 DIAGNOSIS — Z1231 Encounter for screening mammogram for malignant neoplasm of breast: Secondary | ICD-10-CM

## 2018-01-01 ENCOUNTER — Encounter: Payer: Self-pay | Admitting: Family Medicine

## 2018-01-03 ENCOUNTER — Telehealth: Payer: Self-pay

## 2018-01-03 NOTE — Telephone Encounter (Signed)
Copied from [place] [id number]. Topic: Inquiry >> Jan 03, 2018  4:40 PM Oliver Pila B wrote: Reason for CRM: [facility] called to make sure the request for the diabetic record was received to fax back to they're office;  contact [phone]

## 2018-01-03 NOTE — Telephone Encounter (Signed)
Returned call to [place].  I advised information was faxed back to them the same day that faxed over the eye exam notes,  but they state they did not receive the fax.  Information given to them via telephone: Last A1c: 7.3%,  Date of Dx: 07/19/2014 and patient is not on any medications currently for diabetes.

## 2018-01-07 ENCOUNTER — Encounter: Payer: Self-pay | Admitting: Family Medicine

## 2018-05-01 ENCOUNTER — Other Ambulatory Visit: Payer: Self-pay | Admitting: Family Medicine

## 2018-05-01 NOTE — Telephone Encounter (Signed)
Last office visit 10/14/2017.  Last refilled 10/14/2017 for #30 with 3 refills.  Office note states 3-4 month course of zoloft with taper off if doing well.  Ok to refill?

## 2019-01-07 ENCOUNTER — Encounter: Payer: Self-pay | Admitting: Family Medicine

## 2019-01-12 NOTE — Telephone Encounter (Signed)
done

## 2019-07-21 ENCOUNTER — Other Ambulatory Visit: Payer: Self-pay | Admitting: Family Medicine

## 2019-07-21 ENCOUNTER — Encounter: Payer: Self-pay | Admitting: Family Medicine

## 2019-07-21 MED ORDER — SERTRALINE HCL 50 MG PO TABS: 50.0000 mg | ORAL_TABLET | Freq: Every day | ORAL | 0 refills | Status: DC

## 2019-07-23 LAB — HM DIABETES EYE EXAM

## 2019-07-27 ENCOUNTER — Encounter: Payer: Self-pay | Admitting: Family Medicine

## 2019-08-05 ENCOUNTER — Ambulatory Visit: Payer: BLUE CROSS/BLUE SHIELD | Admitting: Family Medicine

## 2019-08-12 ENCOUNTER — Other Ambulatory Visit (INDEPENDENT_AMBULATORY_CARE_PROVIDER_SITE_OTHER): Payer: BC Managed Care – PPO

## 2019-08-12 ENCOUNTER — Other Ambulatory Visit: Payer: Self-pay

## 2019-08-12 DIAGNOSIS — E119 Type 2 diabetes mellitus without complications: Secondary | ICD-10-CM

## 2019-08-12 DIAGNOSIS — Z0000 Encounter for general adult medical examination without abnormal findings: Secondary | ICD-10-CM

## 2019-08-12 LAB — HEPATIC FUNCTION PANEL
ALT: 13 U/L (ref 0–35)
AST: 13 U/L (ref 0–37)
Albumin: 4 g/dL (ref 3.5–5.2)
Alkaline Phosphatase: 90 U/L (ref 39–117)
Bilirubin, Direct: 0.1 mg/dL (ref 0.0–0.3)
Total Bilirubin: 0.5 mg/dL (ref 0.2–1.2)
Total Protein: 7.4 g/dL (ref 6.0–8.3)

## 2019-08-12 LAB — LIPID PANEL
Cholesterol: 184 mg/dL (ref 0–200)
HDL: 50.1 mg/dL
LDL Cholesterol: 118 mg/dL — ABNORMAL HIGH (ref 0–99)
NonHDL: 134.11
Total CHOL/HDL Ratio: 4
Triglycerides: 80 mg/dL (ref 0.0–149.0)
VLDL: 16 mg/dL (ref 0.0–40.0)

## 2019-08-12 LAB — MICROALBUMIN / CREATININE URINE RATIO
Creatinine,U: 151.8 mg/dL
Microalb Creat Ratio: 4.1 mg/g (ref 0.0–30.0)
Microalb, Ur: 6.2 mg/dL — ABNORMAL HIGH (ref 0.0–1.9)

## 2019-08-12 LAB — CBC WITH DIFFERENTIAL/PLATELET
Basophils Absolute: 0.1 10*3/uL (ref 0.0–0.1)
Basophils Relative: 1.2 % (ref 0.0–3.0)
Eosinophils Absolute: 0.1 10*3/uL (ref 0.0–0.7)
Eosinophils Relative: 2.1 % (ref 0.0–5.0)
HCT: 38 % (ref 36.0–46.0)
Hemoglobin: 12.7 g/dL (ref 12.0–15.0)
Lymphocytes Relative: 27.6 % (ref 12.0–46.0)
Lymphs Abs: 1.6 10*3/uL (ref 0.7–4.0)
MCHC: 33.3 g/dL (ref 30.0–36.0)
MCV: 82.6 fl (ref 78.0–100.0)
Monocytes Absolute: 0.3 10*3/uL (ref 0.1–1.0)
Monocytes Relative: 5 % (ref 3.0–12.0)
Neutro Abs: 3.7 10*3/uL (ref 1.4–7.7)
Neutrophils Relative %: 64.1 % (ref 43.0–77.0)
Platelets: 265 10*3/uL (ref 150.0–400.0)
RBC: 4.6 Mil/uL (ref 3.87–5.11)
RDW: 14 % (ref 11.5–15.5)
WBC: 5.8 10*3/uL (ref 4.0–10.5)

## 2019-08-12 LAB — BASIC METABOLIC PANEL WITH GFR
BUN: 8 mg/dL (ref 6–23)
CO2: 23 meq/L (ref 19–32)
Calcium: 9 mg/dL (ref 8.4–10.5)
Chloride: 102 meq/L (ref 96–112)
Creatinine, Ser: 0.66 mg/dL (ref 0.40–1.20)
GFR: 118 mL/min
Glucose, Bld: 218 mg/dL — ABNORMAL HIGH (ref 70–99)
Potassium: 3.8 meq/L (ref 3.5–5.1)
Sodium: 132 meq/L — ABNORMAL LOW (ref 135–145)

## 2019-08-12 LAB — TSH: TSH: 1.89 u[IU]/mL (ref 0.35–4.50)

## 2019-08-12 LAB — HEMOGLOBIN A1C: Hgb A1c MFr Bld: 9.2 % — ABNORMAL HIGH (ref 4.6–6.5)

## 2019-08-13 ENCOUNTER — Other Ambulatory Visit: Payer: BLUE CROSS/BLUE SHIELD

## 2019-08-14 ENCOUNTER — Other Ambulatory Visit: Payer: Self-pay | Admitting: Family Medicine

## 2019-08-20 ENCOUNTER — Ambulatory Visit (INDEPENDENT_AMBULATORY_CARE_PROVIDER_SITE_OTHER): Payer: BC Managed Care – PPO | Admitting: Family Medicine

## 2019-08-20 ENCOUNTER — Encounter: Payer: Self-pay | Admitting: Family Medicine

## 2019-08-20 ENCOUNTER — Other Ambulatory Visit: Payer: Self-pay

## 2019-08-20 VITALS — BP 118/80 | HR 91 | Temp 97.8°F | Ht 66.0 in | Wt 247.5 lb

## 2019-08-20 DIAGNOSIS — R4689 Other symptoms and signs involving appearance and behavior: Secondary | ICD-10-CM

## 2019-08-20 DIAGNOSIS — F411 Generalized anxiety disorder: Secondary | ICD-10-CM | POA: Diagnosis not present

## 2019-08-20 DIAGNOSIS — F4001 Agoraphobia with panic disorder: Secondary | ICD-10-CM

## 2019-08-20 MED ORDER — SERTRALINE HCL 100 MG PO TABS: 100.0000 mg | ORAL_TABLET | Freq: Every day | ORAL | 3 refills | Status: DC

## 2019-08-20 MED ORDER — CLONAZEPAM 0.5 MG PO TABS: 0.5000 mg | ORAL_TABLET | Freq: Two times a day (BID) | ORAL | 1 refills | Status: DC

## 2019-08-20 NOTE — Progress Notes (Signed)
Priscilla Vollman T. Lataja Newland, MD [facility] [place] at [facility] [place], [address] Phone: [phone]  FAX: [phone]  Priscilla King - 44 y.o. female  MRN [id number]  Date of Birth: 1975-11-05  Visit Date: 08/20/2019  PCP: Owens Loffler, MD  Referred by: Owens Loffler, MD  Chief Complaint  Patient presents with  . Annual Exam  . Depression    This visit occurred during the SARS-CoV-2 public health emergency.  Safety protocols were in place, including screening questions prior to the visit, additional usage of staff PPE, and extensive cleaning of exam room while observing appropriate contact time as indicated for disinfecting solutions.   Subjective:   Priscilla King is a 44 y.o. very pleasant female patient who presents with the following:  See below.  This was initially intended as a physical exam, but the patient I thought it was more important to him of these acute issues.  Noticed when started school in September.  OK, could [employer] bout  three courses at a time.  Started off school really stressed out and did some home work.  Baby and 58 year old is at home. No belumia or annorexia in the past.  Feels outside of herself.  In a situation and feels like she  Has some OCD behavious.   Feels exhaused a lot.  Sad several days a week.    At the end of September started to not eel like herself develp a compulsion. History after the baby with some depression there, and this is not exactly the same  Feels like Panic attacks feel like her heart is going to come out of her chest.  Thought that everything is aouut of control.  Has a drive to eat at night.  Feels very off  Feels exhausted.   Minimal interest and energy level. No energy. No si or hi Not even  No guilt  Some level of anxiety Will always think the worset of the situation.  Panic attacks. About once a day. Or two Checks the  locks multiple locks and will check on the children  A lot Compulsive eating  Has   On some zoloft at [age].  Still feels not herself and a lot of stress and noto sleeping really well. At two hour pockets.     Depression:     Past Medical History, Surgical History, Social History, Family History, Problem List, Medications, and Allergies have been reviewed and updated if relevant.   GEN: No acute illnesses, no fevers, chills. GI: No n/v/d, eating normally Pulm: No SOB Interactive and getting along well at home.  Otherwise, ROS is as per the HPI.  Objective:   BP 118/80   Pulse 91   Temp 97.8 F (36.6 C) (Temporal)   Ht 5\' 6"  (1.676 m)   Wt 247 lb 8 oz (112.3 kg)   LMP 08/14/2019   SpO2 97%   BMI 39.95 kg/m    GEN: WDWN, NAD, Non-toxic, Alert & Oriented x 3 HEENT: Atraumatic, Normocephalic.  Ears and Nose: No external deformity. EXTR: No clubbing/cyanosis/edema NEURO: Normal gait.  PSYCH: Normally interactive. Conversant. Not depressed or anxious appearing.  Calm demeanor.   Laboratory and Imaging Data: Results for orders placed or performed in visit on 08/12/19  Lipid panel  Result Value Ref Range   Cholesterol 184 0 - 200 mg/dL   Triglycerides 80.0 0.0 - 149.0 mg/dL   HDL 50.10 >39.00 mg/dL  VLDL 16.0 0.0 - 40.0 mg/dL   LDL Cholesterol 118 (H) 0 - 99 mg/dL   Total CHOL/HDL Ratio 4    NonHDL 134.11   Hepatic function panel  Result Value Ref Range   Total Bilirubin 0.5 0.2 - 1.2 mg/dL   Bilirubin, Direct 0.1 0.0 - 0.3 mg/dL   Alkaline Phosphatase 90 39 - 117 U/L   AST 13 0 - 37 U/L   ALT 13 0 - 35 U/L   Total Protein 7.4 6.0 - 8.3 g/dL   Albumin 4.0 3.5 - 5.2 g/dL  Basic metabolic panel  Result Value Ref Range   Sodium 132 (L) 135 - 145 mEq/L   Potassium 3.8 3.5 - 5.1 mEq/L   Chloride 102 96 - 112 mEq/L   CO2 23 19 - 32 mEq/L   Glucose, Bld 218 (H) 70 - 99 mg/dL   BUN 8 6 - 23 mg/dL   Creatinine, Ser 0.66 0.40 - 1.20 mg/dL   GFR 118.00 >60.00  mL/min   Calcium 9.0 8.4 - 10.5 mg/dL  CBC with Differential/Platelet  Result Value Ref Range   WBC 5.8 4.0 - 10.5 K/uL   RBC 4.60 3.87 - 5.11 Mil/uL   Hemoglobin 12.7 12.0 - 15.0 g/dL   HCT 38.0 36.0 - 46.0 %   MCV 82.6 78.0 - 100.0 fl   MCHC 33.3 30.0 - 36.0 g/dL   RDW 14.0 11.5 - 15.5 %   Platelets 265.0 150.0 - 400.0 K/uL   Neutrophils Relative % 64.1 43.0 - 77.0 %   Lymphocytes Relative 27.6 12.0 - 46.0 %   Monocytes Relative 5.0 3.0 - 12.0 %   Eosinophils Relative 2.1 0.0 - 5.0 %   Basophils Relative 1.2 0.0 - 3.0 %   Neutro Abs 3.7 1.4 - 7.7 K/uL   Lymphs Abs 1.6 0.7 - 4.0 K/uL   Monocytes Absolute 0.3 0.1 - 1.0 K/uL   Eosinophils Absolute 0.1 0.0 - 0.7 K/uL   Basophils Absolute 0.1 0.0 - 0.1 K/uL  Hemoglobin A1c  Result Value Ref Range   Hgb A1c MFr Bld 9.2 (H) 4.6 - 6.5 %  Microalbumin / creatinine urine ratio  Result Value Ref Range   Microalb, Ur 6.2 (H) 0.0 - 1.9 mg/dL   Creatinine,U 151.8 mg/dL   Microalb Creat Ratio 4.1 0.0 - 30.0 mg/g  TSH  Result Value Ref Range   TSH 1.89 0.35 - 4.50 uIU/mL     Assessment and Plan:     ICD-10-CM   1. Panic disorder with agoraphobia and moderate panic attacks  F40.01   2. Compulsive behavior  R46.89   3. GAD (generalized anxiety disorder)  F41.1    She is globally not doing well.  See the notes above.  Over the last few months during the COVID-19 outbreak she has become mild to moderately depressed, and also the worst issues for her are anxiety, generalized anxiety.  These are also compounded with panic attacks at least once a day.  She is also having some agoraphobia and does not want to leave her house.  She also brings up some compulsive behaviors including compulsive eating at nighttime, compulsive checking of her children.  These are new behaviors for her she also feels as if her eating at nighttime has become compulsive in character and even though she knows that this will affect her diabetes.  She is also checking  the locks at her home regularly.  Her blood sugar is up right now, but I am  going to try to get her mental health in a stable position before changing any of her diabetic medication.  Increase her Zoloft dose, and I am going to give her some Klonopin to take twice daily for now to help stabilize the situation acutely  Follow-up: Return in about 4 weeks (around 09/17/2019).  Meds ordered this encounter  Medications  . sertraline (ZOLOFT) 100 MG tablet    Sig: Take 1 tablet (100 mg total) by mouth daily.    Dispense:  30 tablet    Refill:  3  . clonazePAM (KLONOPIN) 0.5 MG tablet    Sig: Take 1 tablet (0.5 mg total) by mouth 2 (two) times daily.    Dispense:  30 tablet    Refill:  1   Modified Medications   No medications on file   No orders of the defined types were placed in this encounter.   Signed,  Maud Deed. Sheritha Louis, MD   Outpatient Encounter Medications as of 08/20/2019  Medication Sig  . clonazePAM (KLONOPIN) 0.5 MG tablet Take 1 tablet (0.5 mg total) by mouth 2 (two) times daily.  . diclofenac sodium (VOLTAREN) 1 % GEL Apply 2 g topically 4 (four) times daily.  . sertraline (ZOLOFT) 100 MG tablet Take 1 tablet (100 mg total) by mouth daily.  . [DISCONTINUED] sertraline (ZOLOFT) 50 MG tablet TAKE 1 TABLET BY MOUTH EVERY DAY   No facility-administered encounter medications on file as of 08/20/2019.

## 2019-09-11 ENCOUNTER — Other Ambulatory Visit: Payer: Self-pay | Admitting: Family Medicine

## 2019-09-15 NOTE — Progress Notes (Signed)
Priscilla King T. Priscilla Rho, MD Primary Care and [place] at [facility] [place], [address] Phone: [phone]  FAX: [phone]  Priscilla King - 44 y.o. female  MRN [id number]  Date of Birth: 1975-07-29  Visit Date: 09/16/2019  PCP: Owens Loffler, MD  Referred by: Owens Loffler, MD Chief Complaint  Patient presents with  . Follow-up    Panic Disorder/GAD   Virtual Visit via Video Note:  I connected with  Priscilla King on 09/16/2019  8:40 AM EST by a video enabled telemedicine application and verified that I am speaking with the correct person using two identifiers.   Location patient: home computer, tablet, or smartphone Location provider: work or home office Consent: Verbal consent directly obtained from [employer]. Persons participating in the virtual visit: patient, provider  I discussed the limitations of evaluation and management by telemedicine and the availability of in person appointments. The patient expressed understanding and agreed to proceed.  History of Present Illness:  She is here for follow-up on some new onset panic attacks and agoraphobia that we discussed last on August 20, 2019.  She also was having some compulsive behaviors.  At that point increase her Zoloft to 100 mg, and also gave her some Klonopin to take twice daily for a short amount of time.  From a anxiety and panic standpoint she is doing better.  She has had significantly decreased OCD behavior.  She is having less crying, but she does actually feel like her sadness is worse.  She does have more interest and energy compared to before.  She is not feeling guilty at all and she has no suicidality or homicidality.  Review of Systems as above: See pertinent positives and pertinent negatives per HPI No acute distress verbally   Observations/Objective/Exam:  An attempt was made to discern vital signs over the phone and per  patient if applicable and possible.   General:    Alert, Oriented, appears well and in no acute distress  Pulmonary:     On inspection no signs of respiratory distress.  Psych / Neurological:     Pleasant and cooperative.  Assessment and Plan:    ICD-10-CM   1. Panic disorder with agoraphobia and moderate panic attacks  F40.01   2. Compulsive behavior  R46.89   3. GAD (generalized anxiety disorder)  F41.1   4. Moderate episode of recurrent major depressive disorder ([place])  F33.1    While she is doing better, sadness is worsened.  Panic and anxiety is improved.  Increase Zoloft to 150 mg daily.  Follow-up in 6 weeks virtually.  If she worsens in any way, she understands that she needs to call.  I discussed the assessment and treatment plan with the patient. The patient was provided an opportunity to ask questions and all were answered. The patient agreed with the plan and demonstrated an understanding of the instructions.   The patient was advised to call back or seek an in-person evaluation if the symptoms worsen or if the condition fails to improve as anticipated.  Follow-up: prn unless noted otherwise below No follow-ups on file.  Meds ordered this encounter  Medications  . sertraline (ZOLOFT) 50 MG tablet    Sig: Take 3 tablets (150 mg total) by mouth daily.    Dispense:  90 tablet    Refill:  3   No orders of the defined types were placed in this encounter.   Signed,  Devany Aja T. Keary Waterson, MD

## 2019-09-16 ENCOUNTER — Other Ambulatory Visit: Payer: Self-pay

## 2019-09-16 ENCOUNTER — Encounter: Payer: Self-pay | Admitting: Family Medicine

## 2019-09-16 ENCOUNTER — Ambulatory Visit (INDEPENDENT_AMBULATORY_CARE_PROVIDER_SITE_OTHER): Payer: BC Managed Care – PPO | Admitting: Family Medicine

## 2019-09-16 VITALS — Ht 66.0 in

## 2019-09-16 DIAGNOSIS — F331 Major depressive disorder, recurrent, moderate: Secondary | ICD-10-CM | POA: Diagnosis not present

## 2019-09-16 DIAGNOSIS — R4689 Other symptoms and signs involving appearance and behavior: Secondary | ICD-10-CM | POA: Diagnosis not present

## 2019-09-16 DIAGNOSIS — F339 Major depressive disorder, recurrent, unspecified: Secondary | ICD-10-CM | POA: Insufficient documentation

## 2019-09-16 DIAGNOSIS — F4001 Agoraphobia with panic disorder: Secondary | ICD-10-CM | POA: Diagnosis not present

## 2019-09-16 DIAGNOSIS — F411 Generalized anxiety disorder: Secondary | ICD-10-CM

## 2019-09-16 MED ORDER — SERTRALINE HCL 50 MG PO TABS: 150.0000 mg | ORAL_TABLET | Freq: Every day | ORAL | 3 refills | Status: DC

## 2019-09-21 ENCOUNTER — Other Ambulatory Visit: Payer: Self-pay | Admitting: Family Medicine

## 2019-09-21 NOTE — Telephone Encounter (Signed)
Last office visit 09/16/2019 for Panic Disorder.  Last refilled 08/20/2019 for #30 with 1 refill.  Next Appt: 10/28/2019 for 6 week follow up.

## 2019-10-12 ENCOUNTER — Other Ambulatory Visit: Payer: Self-pay | Admitting: Family Medicine

## 2019-10-12 NOTE — Telephone Encounter (Signed)
Pharmacy is requesting 90 day supply.  Refill if appropriate.

## 2019-10-27 NOTE — Progress Notes (Signed)
     Priscilla Tourangeau T. Mahaley Schwering, MD Primary Care and [place] at [facility] [place], [address] Phone: [phone]  FAX: [phone]  Priscilla King - 44 y.o. female  MRN [id number]  Date of Birth: [birth year]  Visit Date: 10/28/2019  PCP: Owens Loffler, MD  Referred by: Owens Loffler, MD Chief Complaint  Patient presents with  . Follow-up    Panic Disorder/GAD   Virtual Visit via Video Note:  I connected with  Priscilla King on 10/28/2019  8:40 AM EDT by a video enabled telemedicine application and verified that I am speaking with the correct person using two identifiers.   Location patient: home computer, tablet, or smartphone Location provider: work or home office Consent: Verbal consent directly obtained from [name]. Persons participating in the virtual visit: patient, provider  I discussed the limitations of evaluation and management by telemedicine and the availability of in person appointments. The patient expressed understanding and agreed to proceed.  History of Present Illness:  Follow-up video appointment for some significant depression and quite a bit worsening of her anxiety with some panic attacks.  She also had some compulsive traits again during COVID-19.  She is here today in follow-up of this.  Below:  Review of Systems as above: See pertinent positives and pertinent negatives per HPI No acute distress verbally   Observations/Objective/Exam:  An attempt was made to discern vital signs over the phone and per patient if applicable and possible.   General:    Alert, Oriented, appears well and in no acute distress  Pulmonary:     On inspection no signs of respiratory distress.  Psych / Neurological:     Pleasant and cooperative.  Assessment and Plan:    ICD-10-CM   1. Moderate episode of recurrent major depressive disorder (HCC)  F33.1   2. Panic disorder with agoraphobia and  moderate panic attacks  F40.01   3. Compulsive behavior  R46.89    She is doing quite a bit better, but she has noticed what she thinks are side effects from her increase Zoloft dosing.  She also had a COVID-19 vaccine within the last 2 weeks.  She has had some tremor that started, and she notes this from when she is increased her medication.  Zoloft can cause tremor, so asked her to drop this down to 100 mg daily.  We also are going to taper her off Klonopin.  1/2 tablet p.o. twice daily for 2 weeks, then stop.  She is also doing some cognitive behavioral therapy, and I think that this is going to be crucial to her.  Overall she is doing quite well and improved, but not back to baseline.  Follow-up with me as needed and then for routine health care and physical.  I discussed the assessment and treatment plan with the patient. The patient was provided an opportunity to ask questions and all were answered. The patient agreed with the plan and demonstrated an understanding of the instructions.   The patient was advised to call back or seek an in-person evaluation if the symptoms worsen or if the condition fails to improve as anticipated.  Follow-up: prn unless noted otherwise below No follow-ups on file.  No orders of the defined types were placed in this encounter.  No orders of the defined types were placed in this encounter.   Signed,  Maud Deed. Jhoel Stieg, MD

## 2019-10-28 ENCOUNTER — Other Ambulatory Visit: Payer: Self-pay

## 2019-10-28 ENCOUNTER — Encounter: Payer: Self-pay | Admitting: Family Medicine

## 2019-10-28 ENCOUNTER — Ambulatory Visit (INDEPENDENT_AMBULATORY_CARE_PROVIDER_SITE_OTHER): Payer: BC Managed Care – PPO | Admitting: Family Medicine

## 2019-10-28 VITALS — Ht 66.0 in

## 2019-10-28 DIAGNOSIS — R4689 Other symptoms and signs involving appearance and behavior: Secondary | ICD-10-CM

## 2019-10-28 DIAGNOSIS — F331 Major depressive disorder, recurrent, moderate: Secondary | ICD-10-CM | POA: Diagnosis not present

## 2019-10-28 DIAGNOSIS — F4001 Agoraphobia with panic disorder: Secondary | ICD-10-CM | POA: Diagnosis not present

## 2019-11-16 ENCOUNTER — Encounter: Payer: Self-pay | Admitting: Family Medicine

## 2019-11-16 NOTE — Telephone Encounter (Signed)
Form printed and placed in Dr. Lillie Fragmin in box to complete.

## 2019-11-18 NOTE — Telephone Encounter (Signed)
Priscilla King, it is ready.  Not sure if we can send and mail through mychart?

## 2020-01-29 ENCOUNTER — Other Ambulatory Visit: Payer: Self-pay

## 2020-01-29 ENCOUNTER — Encounter: Payer: Self-pay | Admitting: Obstetrics and Gynecology

## 2020-01-29 ENCOUNTER — Ambulatory Visit (INDEPENDENT_AMBULATORY_CARE_PROVIDER_SITE_OTHER): Payer: BC Managed Care – PPO | Admitting: Obstetrics and Gynecology

## 2020-01-29 ENCOUNTER — Other Ambulatory Visit (HOSPITAL_COMMUNITY)
Admission: RE | Admit: 2020-01-29 | Discharge: 2020-01-29 | Disposition: A | Discharge status: Home / Self Care | Payer: BC Managed Care – PPO | Source: Ambulatory Visit | Attending: Obstetrics and Gynecology | Admitting: Obstetrics and Gynecology

## 2020-01-29 VITALS — BP 110/73 | HR 83 | Ht 66.0 in | Wt 246.5 lb

## 2020-01-29 DIAGNOSIS — Z124 Encounter for screening for malignant neoplasm of cervix: Secondary | ICD-10-CM

## 2020-01-29 DIAGNOSIS — Z1231 Encounter for screening mammogram for malignant neoplasm of breast: Secondary | ICD-10-CM

## 2020-01-29 DIAGNOSIS — F329 Major depressive disorder, single episode, unspecified: Secondary | ICD-10-CM

## 2020-01-29 DIAGNOSIS — I10 Essential (primary) hypertension: Secondary | ICD-10-CM

## 2020-01-29 DIAGNOSIS — F419 Anxiety disorder, unspecified: Secondary | ICD-10-CM

## 2020-01-29 DIAGNOSIS — E669 Obesity, unspecified: Secondary | ICD-10-CM

## 2020-01-29 DIAGNOSIS — Z01419 Encounter for gynecological examination (general) (routine) without abnormal findings: Secondary | ICD-10-CM

## 2020-01-29 DIAGNOSIS — E282 Polycystic ovarian syndrome: Secondary | ICD-10-CM

## 2020-01-29 DIAGNOSIS — F32A Depression, unspecified: Secondary | ICD-10-CM

## 2020-01-29 DIAGNOSIS — E119 Type 2 diabetes mellitus without complications: Secondary | ICD-10-CM

## 2020-01-29 NOTE — Progress Notes (Addendum)
GYNECOLOGY ANNUAL PHYSICAL EXAM PROGRESS NOTE  Subjective:    Priscilla King is a 44 y.o. G34P2012 female who presents for an annual exam. The patient has no complaints today. The patient is sexually active. The patient wears seatbelts: yes. The patient participates in regular exercise: yes (walking). Has the patient ever been transfused or tattooed?: no.    Gynecologic History  Menarche age: 8 Patient's last menstrual period was 01/18/2020. Contraception: vasectomy History of STI's: Denies Last Pap: 08/2016. Results were: normal.  Denies h/o abnormal pap smears. Last mammogram: 09/10/2016. Results were normal.    OB History  Gravida Para Term Preterm AB Living  3 2 2  0 1 2  SAB TAB Ectopic Multiple Live Births  0 1 0 0 2    # Outcome Date GA Lbr Len/2nd Weight Sex Delivery Anes PTL Lv  3 Term 03/20/17 [redacted]w[redacted]d  10 lb 9 oz (4.79 kg) F CS-Vac Spinal  LIV     Name: Pistole,[name]     Apgar1: 8  Apgar5: 9  2 Term 08/31/12 [redacted]w[redacted]d  8 lb 12.4 oz (3.98 kg) M CS-LVertical EPI  LIV     Name: Onken,BOY Maira     Apgar1: 8  Apgar5: 9  1 TAB 1998    U    DEC     Birth Comments: Elective AB     Past Medical History:  Diagnosis Date  . Diabetes mellitus type 2, uncontrolled, without complications 46/6599  . History of polycystic ovaries   . Hyperlipidemia LDL goal <70 06/2014  . Kienbock's avascular necrosis of lunate, adult   . Obesity   . Seronegative arthritis    possible, [place], [place]    Past Surgical History:  Procedure Laterality Date  . CESAREAN SECTION N/A 08/31/2012   Procedure: CESAREAN SECTION;  Surgeon: Mora Bellman, MD;  Location: [facility];  Service: Obstetrics;  Laterality: N/A;  . CESAREAN SECTION N/A 03/20/2017   Procedure: REPEAT CESAREAN SECTION;  Surgeon: Rubie Maid, MD;  Location: [facility];  Service: Obstetrics;  Laterality: N/A;  Female born @ 1500 Apgars: 8/9 Weight:10lbs 9ozs    Family History  Problem Relation Age of Onset  . Stroke Mother         CVA (TIA)  . Diabetes Mother   . Diabetes Maternal Grandmother   . Hypertension Maternal Grandmother   . Diabetes Paternal Grandmother   . Hypertension Paternal Grandmother     Social History   Socioeconomic History  . Marital status: Married    Spouse name: Not on file  . Number of children: 1  . Years of education: Not on file  . Highest education level: Not on file  Occupational History  . 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 Smoking status: Never Smoker  . Smokeless tobacco: Never Used  Vaping Use  . Vaping Use: Never used  Substance and Sexual Activity  . Alcohol use: No  . Drug use: No  . Sexual activity: Yes    Birth control/protection: None    Comment: IUD removed in Oct 2017  Other Topics Concern  . Not on file  Social History Narrative   Married, [place]   [place]   No exercise- knee   Professor of Conservation officer, nature at [employer] Strain:   . Difficulty of Paying Living Expenses:  Food Insecurity:   . Worried About Charity fundraiser in the Last Year:   . Arboriculturist in the Last Year:   Transportation Needs:   . Film/video editor (Medical):   Marland Kitchen Lack of Transportation (Non-Medical):   Physical Activity:   . Days of Exercise per Week:   . Minutes of Exercise per Session:   Stress:   . Feeling of Stress :   Social Connections:   . Frequency of Communication with Friends and Family:   . Frequency of Social Gatherings with Friends and Family:   . Attends Religious Services:   . Active Member of Clubs or Organizations:   . Attends Archivist Meetings:   Marland Kitchen Marital Status:   Intimate Partner Violence:   . Fear of Current or Ex-Partner:   . Emotionally Abused:   Marland Kitchen Physically Abused:   . Sexually Abused:     Outpatient Encounter Medications as of 01/29/2020  Medication Sig  . clonazePAM  (KLONOPIN) 0.5 MG tablet TAKE 1 TABLET BY MOUTH 2 TIMES DAILY.  Marland Kitchen diclofenac sodium (VOLTAREN) 1 % GEL Apply 2 g topically 4 (four) times daily.  . sertraline (ZOLOFT) 50 MG tablet TAKE 3 TABLETS BY MOUTH EVERY DAY   No facility-administered encounter medications on file as of 01/29/2020.     Allergies  Allergen Reactions  . Vicodin [Hydrocodone-Acetaminophen] Swelling    Tongue swelling and headache      Review of Systems Constitutional: negative for chills, fatigue, fevers and sweats Eyes: negative for irritation, redness and visual disturbance Ears, nose, mouth, throat, and face: negative for hearing loss, nasal congestion, snoring and tinnitus Respiratory: negative for asthma, cough, sputum Cardiovascular: negative for chest pain, dyspnea, exertional chest pressure/discomfort, irregular heart beat, palpitations and syncope Gastrointestinal: negative for abdominal pain, change in bowel habits, nausea and vomiting Genitourinary: negative for abnormal menstrual periods, genital lesions, sexual problems and vaginal discharge, dysuria and urinary incontinence Integument/breast: negative for breast lump, tenderness, and nipple discharge.  Hematologic/lymphatic: negative for bleeding and easy bruising Musculoskeletal:negative for back pain and muscle weakness.  Positive for arthritic pain in knees (has been present for years).  Neurological: negative for dizziness, headaches, vertigo and weakness Endocrine: negative for diabetic symptoms including polydipsia, polyuria and skin dryness Allergic/Immunologic: negative for hay fever and urticaria    Psychological: Positive for - anxiety, depression (managed by therapist).  Negative for - concentration difficulties, obsessive thoughts, sleep disturbances or suicidal ideation    Objective:  Blood pressure 110/73, pulse 83, height 5\' 6"  (1.676 m), weight 246 lb 8 oz (111.8 kg), last menstrual period 01/18/2020. Body mass index is 39.79  kg/m.  General Appearance:    Alert, cooperative, no distress, appears stated age  Head:    Normocephalic, without obvious abnormality, atraumatic  Eyes:    PERRL, conjunctiva/corneas clear, EOM's intact, both eyes  Ears:    Normal external ear canals, both ears  Nose:   Nares normal, septum midline, mucosa normal, no drainage or sinus tenderness  Throat:   Lips, mucosa, and tongue normal; teeth and gums normal  Neck:   Supple, symmetrical, trachea midline, no adenopathy; thyroid: no enlargement/tenderness/nodules; no carotid bruit or JVD  Back:     Symmetric, no curvature, ROM normal, no CVA tenderness  Lungs:     Clear to auscultation bilaterally, respirations unlabored  Chest Wall:    No tenderness or deformity   Heart:    Regular rate and rhythm, S1 and S2 normal, no murmur, rub  or gallop  Breast Exam:    No tenderness, masses, or nipple abnormality. Few small papillary hyperpigmented areas around areola.   Abdomen:     Soft, non-tender, bowel sounds active all four quadrants, no masses, no organomegaly. Well healed Pfannenstiel incision   Genitalia:    Pelvic:external genitalia normal, vagina without lesions, discharge, or tenderness, rectovaginal septum  normal. Cervix normal in appearance, no cervical motion tenderness, no adnexal masses or tenderness.  Uterus normal size, shape, mobile, regular contours, nontender.  Rectal:    Normal external sphincter.  No hemorrhoids appreciated. Internal exam not done.   Extremities:   Extremities normal, atraumatic, no cyanosis or edema  Pulses:   2+ and symmetric all extremities  Skin:   Skin color, texture, turgor normal, no rashes or lesions  Lymph nodes:   Cervical, supraclavicular, and axillary nodes normal  Neurologic:   CNII-XII intact, normal strength, sensation and reflexes throughout    Labs:  Lab Results  Component Value Date   WBC 5.8 08/12/2019   HGB 12.7 08/12/2019   HCT 38.0 08/12/2019   MCV 82.6 08/12/2019   PLT 265.0  08/12/2019    Lab Results  Component Value Date   CREATININE 0.66 08/12/2019   BUN 8 08/12/2019   NA 132 (L) 08/12/2019   K 3.8 08/12/2019   CL 102 08/12/2019   CO2 23 08/12/2019    Lab Results  Component Value Date   ALT 13 08/12/2019   AST 13 08/12/2019   ALKPHOS 90 08/12/2019   BILITOT 0.5 08/12/2019     Lab Results  Component Value Date   TSH 1.89 08/12/2019     Lab Results  Component Value Date   CHOL 184 08/12/2019   HDL 50.10 08/12/2019   LDLCALC 118 (H) 08/12/2019   TRIG 80.0 08/12/2019   CHOLHDL 4 08/12/2019    Lab Results  Component Value Date   HGBA1C 9.2 (H) 08/12/2019    Assessment:     Well woman exam with routine gynecological exam Breast cancer screening by mammogram  Depression and anxiety Essential hypertension Obesity (BMI 30-39.9) PCOS (polycystic ovarian syndrome) Type 2 diabetes mellitus without complication, without long-term current use of insulin (HCC) (uncontrolled)    Plan:     Blood tests: up to date Breast self exam technique reviewed and patient encouraged to perform self-exam monthly.   Contraception: vasectomy. Discussed healthy lifestyle modifications. Mammogram ordered.  Pap smear performed today.  Continue routine screening. HTN and Diabetes managed by PCP. HgbA1c significantly elevated.  Patient does not appear to be on any medications for her diabetes. She states that her PCP desired for her to get better management of her anxiety and depression first, and then would begin managment of her diabetes. Anxiety and depression, currently managed by therapist.  Has completed COVID vaccination series.  Return to clinic in 1 year.       [facility] - 01/29/20 0814      Pregnancy Intention Screening   Does the patient want to become pregnant in the next year? No    Does the patient's partner want to become pregnant in the next year? No    Would the patient like to discuss contraceptive options today? No       Contraception Wrap Up   Current Method Vasectomy    Contraception Counseling Provided Yes          The pregnancy intention screening data noted above was reviewed. Potential methods of contraception were discussed. The patient elected to proceed  with Vasectomy.   Rubie Maid, MD [facility]

## 2020-01-29 NOTE — Progress Notes (Signed)
Pt present for annual exam. Pt stated that she was doing well and denies any issues at this time.  

## 2020-01-29 NOTE — Patient Instructions (Addendum)
Preventive Care 54-43 Years Old, Female Preventive care refers to visits with your health care provider and lifestyle choices that can promote health and wellness. This includes:  A yearly physical exam. This may also be called an annual well check.  Regular dental visits and eye exams.  Immunizations.  Screening for certain conditions.  Healthy lifestyle choices, such as eating a healthy diet, getting regular exercise, not using drugs or products that contain nicotine and tobacco, and limiting alcohol use. What can I expect for my preventive care visit? Physical exam Your health care provider will check your:  Height and weight. This may be used to calculate body mass index (BMI), which tells if you are at a healthy weight.  Heart rate and blood pressure.  Skin for abnormal spots. Counseling Your health care provider may ask you questions about your:  Alcohol, tobacco, and drug use.  Emotional well-being.  Home and relationship well-being.  Sexual activity.  Eating habits.  Work and work Statistician.  Method of birth control.  Menstrual cycle.  Pregnancy history. What immunizations do I need?  Influenza (flu) vaccine  This is recommended every year. Tetanus, diphtheria, and pertussis (Tdap) vaccine  You may need a Td booster every 10 years. Varicella (chickenpox) vaccine  You may need this if you have not been vaccinated. Zoster (shingles) vaccine  You may need this after age 16. Measles, mumps, and rubella (MMR) vaccine  You may need at least one dose of MMR if you were born in 1957 or later. You may also need a second dose. Pneumococcal conjugate (PCV13) vaccine  You may need this if you have certain conditions and were not previously vaccinated. Pneumococcal polysaccharide (PPSV23) vaccine  You may need one or two doses if you smoke cigarettes or if you have certain conditions. Meningococcal conjugate (MenACWY) vaccine  You may need this if you  have certain conditions. Hepatitis A vaccine  You may need this if you have certain conditions or if you travel or work in places where you may be exposed to hepatitis A. Hepatitis B vaccine  You may need this if you have certain conditions or if you travel or work in places where you may be exposed to hepatitis B. Haemophilus influenzae type b (Hib) vaccine  You may need this if you have certain conditions. Human papillomavirus (HPV) vaccine  If recommended by your health care provider, you may need three doses over 6 months. You may receive vaccines as individual doses or as more than one vaccine together in one shot (combination vaccines). Talk with your health care provider about the risks and benefits of combination vaccines. What tests do I need? Blood tests  Lipid and cholesterol levels. These may be checked every 5 years, or more frequently if you are over 20 years old.  Hepatitis C test.  Hepatitis B test. Screening  Lung cancer screening. You may have this screening every year starting at age 25 if you have a 30-pack-year history of smoking and currently smoke or have quit within the past 15 years.  Colorectal cancer screening. All adults should have this screening starting at age 28 and continuing until age 103. Your health care provider may recommend screening at age 76 if you are at increased risk. You will have tests every 1-10 years, depending on your results and the type of screening test.  Diabetes screening. This is done by checking your blood sugar (glucose) after you have not eaten for a while (fasting). You may have this  done every 1-3 years.  Mammogram. This may be done every 1-2 years. Talk with your health care provider about when you should start having regular mammograms. This may depend on whether you have a family history of breast cancer.  BRCA-related cancer screening. This may be done if you have a family history of breast, ovarian, tubal, or peritoneal  cancers.  Pelvic exam and Pap test. This may be done every 3 years starting at age 70. Starting at age 87, this may be done every 5 years if you have a Pap test in combination with an HPV test. Other tests  Sexually transmitted disease (STD) testing.  Bone density scan. This is done to screen for osteoporosis. You may have this scan if you are at high risk for osteoporosis. Follow these instructions at home: Eating and drinking  Eat a diet that includes fresh fruits and vegetables, whole grains, lean protein, and low-fat dairy.  Take vitamin and mineral supplements as recommended by your health care provider.  Do not drink alcohol if: ? Your health care provider tells you not to drink. ? You are pregnant, may be pregnant, or are planning to become pregnant.  If you drink alcohol: ? Limit how much you have to 0-1 drink a day. ? Be aware of how much alcohol is in your drink. In the [place] one drink equals one 12 oz bottle of beer (355 mL), one 5 oz glass of wine (148 mL), or one 1 oz glass of hard liquor (44 mL). Lifestyle  Take daily care of your teeth and gums.  Stay active. Exercise for at least 30 minutes on 5 or more days each week.  Do not use any products that contain nicotine or tobacco, such as cigarettes, e-cigarettes, and chewing tobacco. If you need help quitting, ask your health care provider.  If you are sexually active, practice safe sex. Use a condom or other form of birth control (contraception) in order to prevent pregnancy and STIs (sexually transmitted infections).  If told by your health care provider, take low-dose aspirin daily starting at age 59. What's next?  Visit your health care provider once a year for a well check visit.  Ask your health care provider how often you should have your eyes and teeth checked.  Stay up to date on all vaccines. This information is not intended to replace advice given to you by your health care provider. Make sure you  discuss any questions you have with your health care provider. Document Revised: 03/20/2018 Document Reviewed: 03/20/2018 Elsevier Patient Education  2020 [employer] Breast self-awareness is knowing how your breasts look and feel. Doing breast self-awareness is important. It allows you to catch a breast problem early while it is still small and can be treated. All women should do breast self-awareness, including women who have had breast implants. Tell your doctor if you notice a change in your breasts. What you need:  A mirror.  A well-lit room. How to do a breast self-exam A breast self-exam is one way to learn what is normal for your breasts and to check for changes. To do a breast self-exam: Look for changes  1. Take off all the clothes above your waist. 2. Stand in front of a mirror in a room with good lighting. 3. Put your hands on your hips. 4. Push your hands down. 5. Look at your breasts and nipples in the mirror to see if one breast or nipple looks different  other. Check to see if: ? The shape of one breast is different. ? The size of one breast is different. ? There are wrinkles, dips, and bumps in one breast and not the other. 6. Look at each breast for changes in the skin, such as: ? Redness. ? Scaly areas. 7. Look for changes in your nipples, such as: ? Liquid around the nipples. ? Bleeding. ? Dimpling. ? Redness. ? A change in where the nipples are. Feel for changes  1. Lie on your back on the floor. 2. Feel each breast. To do this, follow these steps: ? Pick a breast to feel. ? Put the arm closest to that breast above your head. ? Use your other arm to feel the nipple area of your breast. Feel the area with the pads of your three middle fingers by making small circles with your fingers. For the first circle, press lightly. For the second circle, press harder. For the third circle, press even harder. ? Keep making circles with  your fingers at the different pressures as you move down your breast. Stop when you feel your ribs. ? Move your fingers a little toward the center of your body. ? Start making circles with your fingers again, this time going up until you reach your collarbone. ? Keep making up-and-down circles until you reach your armpit. Remember to keep using the three pressures. ? Feel the other breast in the same way. 3. Sit or stand in the tub or shower. 4. With soapy water on your skin, feel each breast the same way you did in step 2 when you were lying on the floor. Write down what you find Writing down what you find can help you remember what to tell your doctor. Write down:  What is normal for each breast.  Any changes you find in each breast, including: ? The kind of changes you find. ? Whether you have pain. ? Size and location of any lumps.  When you last had your menstrual period. General tips  Check your breasts every month.  If you are breastfeeding, the best time to check your breasts is after you feed your baby or after you use a breast pump.  If you get menstrual periods, the best time to check your breasts is 5-7 days after your menstrual period is over.  With time, you will become comfortable with the self-exam, and you will begin to know if there are changes in your breasts. Contact a doctor if you:  See a change in the shape or size of your breasts or nipples.  See a change in the skin of your breast or nipples, such as red or scaly skin.  Have fluid coming from your nipples that is not normal.  Find a lump or thick area that was not there before.  Have pain in your breasts.  Have any concerns about your breast health. Summary  Breast self-awareness includes looking for changes in your breasts, as well as feeling for changes within your breasts.  Breast self-awareness should be done in front of a mirror in a well-lit room.  You should check your breasts every month.  If you get menstrual periods, the best time to check your breasts is 5-7 days after your menstrual period is over.  Let your doctor know of any changes you see in your breasts, including changes in size, changes on the skin, pain or tenderness, or fluid from your nipples that is not normal. This information is not   is not intended to replace advice given to you by your health care provider. Make sure you discuss any questions you have with your health care provider. Document Revised: 02/25/2018 Document Reviewed: 02/25/2018 Elsevier Patient Education  [employer].

## 2020-02-03 LAB — CYTOLOGY - PAP
Comment: NEGATIVE
Diagnosis: NEGATIVE
High risk HPV: NEGATIVE

## 2020-02-12 ENCOUNTER — Ambulatory Visit
Admission: RE | Admit: 2020-02-12 | Discharge: 2020-02-12 | Disposition: A | Discharge status: Home / Self Care | Payer: BC Managed Care – PPO | Source: Ambulatory Visit | Attending: Obstetrics and Gynecology | Admitting: Obstetrics and Gynecology

## 2020-02-12 DIAGNOSIS — Z1231 Encounter for screening mammogram for malignant neoplasm of breast: Secondary | ICD-10-CM | POA: Diagnosis not present

## 2020-02-17 DIAGNOSIS — H93293 Other abnormal auditory perceptions, bilateral: Secondary | ICD-10-CM | POA: Diagnosis not present

## 2020-07-14 ENCOUNTER — Telehealth: Payer: BC Managed Care – PPO | Admitting: Emergency Medicine

## 2020-07-14 DIAGNOSIS — R0981 Nasal congestion: Secondary | ICD-10-CM

## 2020-07-14 DIAGNOSIS — R059 Cough, unspecified: Secondary | ICD-10-CM

## 2020-07-14 MED ORDER — AZITHROMYCIN 250 MG PO TABS: ORAL_TABLET | ORAL | 0 refills | Status: DC

## 2020-07-14 MED ORDER — BENZONATATE 100 MG PO CAPS: 100.0000 mg | ORAL_CAPSULE | Freq: Two times a day (BID) | ORAL | 0 refills | Status: DC | PRN

## 2020-07-14 NOTE — Progress Notes (Signed)
We are sorry that you are not feeling well.  Here is how we plan to help!  Based on your presentation I believe you most likely have A cough due to bacteria.  When patients have a fever and a productive cough with a change in color or increased sputum production, we are concerned about bacterial bronchitis.  If left untreated it can progress to pneumonia.  If your symptoms do not improve with your treatment plan it is important that you contact your provider.   I have prescribed Azithromyin 250 mg: two tablets now and then one tablet daily for 4 additonal days    In addition  you may use A prescription cough medication called Tessalon Perles 100mg. You may take 1-2 capsules every 8 hours as needed for your cough.    From your responses in the eVisit questionnaire you describe inflammation in the upper respiratory tract which is causing a significant cough.  This is commonly called Bronchitis and has four common causes:    Allergies  Viral Infections  Acid Reflux  Bacterial Infection Allergies, viruses and acid reflux are treated by controlling symptoms or eliminating the cause. An example might be a cough caused by taking certain blood pressure medications. You stop the cough by changing the medication. Another example might be a cough caused by acid reflux. Controlling the reflux helps control the cough.  USE OF BRONCHODILATOR ("RESCUE") INHALERS: There is a risk from using your bronchodilator too frequently.  The risk is that over-reliance on a medication which only relaxes the muscles surrounding the breathing tubes can reduce the effectiveness of medications prescribed to reduce swelling and congestion of the tubes themselves.  Although you feel brief relief from the bronchodilator inhaler, your asthma may actually be worsening with the tubes becoming more swollen and filled with mucus.  This can delay other crucial treatments, such as oral steroid medications. If you need to use a  bronchodilator inhaler daily, several times per day, you should discuss this with your provider.  There are probably better treatments that could be used to keep your asthma under control.     HOME CARE . Only take medications as instructed by your medical team. . Complete the entire course of an antibiotic. . Drink plenty of fluids and get plenty of rest. . Avoid close contacts especially the very young and the elderly . Cover your mouth if you cough or cough into your sleeve. . Always remember to wash your hands . A steam or ultrasonic humidifier can help congestion.   GET HELP RIGHT AWAY IF: . You develop worsening fever. . You become short of breath . You cough up blood. . Your symptoms persist after you have completed your treatment plan MAKE SURE YOU   Understand these instructions.  Will watch your condition.  Will get help right away if you are not doing well or get worse.  Your e-visit answers were reviewed by a board certified advanced clinical practitioner to complete your personal care plan.  Depending on the condition, your plan could have included both over the counter or prescription medications. If there is a problem please reply  once you have received a response from your provider. Your safety is important to us.  If you have drug allergies check your prescription carefully.    You can use MyChart to ask questions about today's visit, request a non-urgent call back, or ask for a work or school excuse for 24 hours related to this e-Visit. If it   has been greater than 24 hours you will need to follow up with your provider, or enter a new e-Visit to address those concerns. You will get an e-mail in the next two days asking about your experience.  I hope that your e-visit has been valuable and will speed your recovery. Thank you for using e-visits.  Approximately 5 minutes was used in reviewing the patient's chart, questionnaire, prescribing medications, and  documentation.  

## 2020-07-28 DIAGNOSIS — Z23 Encounter for immunization: Secondary | ICD-10-CM | POA: Diagnosis not present

## 2020-08-29 ENCOUNTER — Other Ambulatory Visit: Payer: Self-pay | Admitting: Family Medicine

## 2020-08-29 NOTE — Telephone Encounter (Signed)
Spoke with patient schedule CPE with fasting lab

## 2020-08-29 NOTE — Telephone Encounter (Signed)
Please schedule CPE with fasting labs prior with Dr. Copland.  

## 2020-09-08 ENCOUNTER — Other Ambulatory Visit: Payer: Self-pay

## 2020-09-08 ENCOUNTER — Emergency Department: Payer: BC Managed Care – PPO

## 2020-09-08 ENCOUNTER — Emergency Department
Admission: EM | Admit: 2020-09-08 | Discharge: 2020-09-09 | Disposition: A | Discharge status: Home / Self Care | Payer: BC Managed Care – PPO | Attending: Emergency Medicine | Admitting: Emergency Medicine

## 2020-09-08 DIAGNOSIS — R079 Chest pain, unspecified: Secondary | ICD-10-CM | POA: Diagnosis not present

## 2020-09-08 DIAGNOSIS — R918 Other nonspecific abnormal finding of lung field: Secondary | ICD-10-CM | POA: Diagnosis not present

## 2020-09-08 DIAGNOSIS — M549 Dorsalgia, unspecified: Secondary | ICD-10-CM | POA: Insufficient documentation

## 2020-09-08 DIAGNOSIS — R1013 Epigastric pain: Secondary | ICD-10-CM | POA: Diagnosis not present

## 2020-09-08 DIAGNOSIS — M5137 Other intervertebral disc degeneration, lumbosacral region: Secondary | ICD-10-CM | POA: Diagnosis not present

## 2020-09-08 DIAGNOSIS — R911 Solitary pulmonary nodule: Secondary | ICD-10-CM | POA: Diagnosis not present

## 2020-09-08 DIAGNOSIS — R0602 Shortness of breath: Secondary | ICD-10-CM | POA: Insufficient documentation

## 2020-09-08 DIAGNOSIS — E119 Type 2 diabetes mellitus without complications: Secondary | ICD-10-CM | POA: Diagnosis not present

## 2020-09-08 DIAGNOSIS — K8020 Calculus of gallbladder without cholecystitis without obstruction: Secondary | ICD-10-CM

## 2020-09-08 DIAGNOSIS — R0789 Other chest pain: Secondary | ICD-10-CM | POA: Diagnosis not present

## 2020-09-08 LAB — HEPATIC FUNCTION PANEL
ALT: 22 U/L (ref 0–44)
AST: 41 U/L (ref 15–41)
Albumin: 4 g/dL (ref 3.5–5.0)
Alkaline Phosphatase: 79 U/L (ref 38–126)
Bilirubin, Direct: <0.1 mg/dL (ref 0.0–0.2)
Total Bilirubin: 0.6 mg/dL (ref 0.3–1.2)
Total Protein: 8.2 g/dL — ABNORMAL HIGH (ref 6.5–8.1)

## 2020-09-08 LAB — CBC
HCT: 38.8 % (ref 36.0–46.0)
Hemoglobin: 12.2 g/dL (ref 12.0–15.0)
MCH: 25.5 pg — ABNORMAL LOW (ref 26.0–34.0)
MCHC: 31.4 g/dL (ref 30.0–36.0)
MCV: 81 fL (ref 80.0–100.0)
Platelets: 304 10*3/uL (ref 150–400)
RBC: 4.79 MIL/uL (ref 3.87–5.11)
RDW: 13.5 % (ref 11.5–15.5)
WBC: 10.8 10*3/uL — ABNORMAL HIGH (ref 4.0–10.5)
nRBC: 0 % (ref 0.0–0.2)

## 2020-09-08 LAB — BASIC METABOLIC PANEL
Anion gap: 14 (ref 5–15)
BUN: 12 mg/dL (ref 6–20)
CO2: 21 mmol/L — ABNORMAL LOW (ref 22–32)
Calcium: 9.3 mg/dL (ref 8.9–10.3)
Chloride: 99 mmol/L (ref 98–111)
Creatinine, Ser: 0.65 mg/dL (ref 0.44–1.00)
GFR, Estimated: >60 mL/min (ref >60)
Glucose, Bld: 214 mg/dL — ABNORMAL HIGH (ref 70–99)
Potassium: 3.4 mmol/L — ABNORMAL LOW (ref 3.5–5.1)
Sodium: 134 mmol/L — ABNORMAL LOW (ref 135–145)

## 2020-09-08 LAB — TROPONIN I (HIGH SENSITIVITY)
Troponin I (High Sensitivity): 3 ng/L (ref <18)
Troponin I (High Sensitivity): 3 ng/L (ref <18)

## 2020-09-08 LAB — LIPASE, BLOOD: Lipase: 51 U/L (ref 11–51)

## 2020-09-08 MED ORDER — FAMOTIDINE IN NACL 20-0.9 MG/50ML-% IV SOLN
20.0000 mg | Freq: Once | INTRAVENOUS | Status: AC
  Administered 2020-09-09: 20 mg via INTRAVENOUS
  Filled 2020-09-08: qty 50

## 2020-09-08 NOTE — ED Triage Notes (Signed)
Pt had sudden onset of pain that started in her chest and went straight through to her back, pain described as heaviness and tightness. Pt unable to sit still in triage pt is tachypnic and grabbing chest.

## 2020-09-08 NOTE — ED Notes (Signed)
Vital signs stable. 

## 2020-09-08 NOTE — ED Notes (Signed)
Patient is resting comfortably. 

## 2020-09-08 NOTE — ED Notes (Signed)
Patient aware of need for urine specimen. Patient reports having just urinated in triage.

## 2020-09-09 ENCOUNTER — Other Ambulatory Visit: Payer: Self-pay

## 2020-09-09 ENCOUNTER — Emergency Department: Payer: BC Managed Care – PPO

## 2020-09-09 DIAGNOSIS — R079 Chest pain, unspecified: Secondary | ICD-10-CM | POA: Diagnosis not present

## 2020-09-09 DIAGNOSIS — R918 Other nonspecific abnormal finding of lung field: Secondary | ICD-10-CM | POA: Diagnosis not present

## 2020-09-09 DIAGNOSIS — R1013 Epigastric pain: Secondary | ICD-10-CM | POA: Diagnosis not present

## 2020-09-09 DIAGNOSIS — M5137 Other intervertebral disc degeneration, lumbosacral region: Secondary | ICD-10-CM | POA: Diagnosis not present

## 2020-09-09 DIAGNOSIS — R911 Solitary pulmonary nodule: Secondary | ICD-10-CM | POA: Diagnosis not present

## 2020-09-09 DIAGNOSIS — K8020 Calculus of gallbladder without cholecystitis without obstruction: Secondary | ICD-10-CM | POA: Diagnosis not present

## 2020-09-09 LAB — POC URINE PREG, ED: Preg Test, Ur: NEGATIVE

## 2020-09-09 MED ORDER — KETOROLAC TROMETHAMINE 30 MG/ML IJ SOLN
15.0000 mg | Freq: Once | INTRAMUSCULAR | Status: AC
  Administered 2020-09-09: 15 mg via INTRAVENOUS
  Filled 2020-09-09: qty 1

## 2020-09-09 MED ORDER — ONDANSETRON 4 MG PO TBDP: 4.0000 mg | ORAL_TABLET | Freq: Three times a day (TID) | ORAL | 0 refills | Status: DC | PRN

## 2020-09-09 MED ORDER — IOHEXOL 350 MG/ML SOLN
100.0000 mL | Freq: Once | INTRAVENOUS | Status: AC | PRN
  Administered 2020-09-09: 100 mL via INTRAVENOUS

## 2020-09-09 NOTE — Telephone Encounter (Signed)
Since she was requesting a physical, the next available at that time was not until the first week in March. I have contacted the patient and moved this to Monday, 2/21. She is requesting to still come that morning to have her labs drawn since she will need to fast.

## 2020-09-09 NOTE — ED Notes (Signed)
Patient transported to CT 

## 2020-09-09 NOTE — ED Notes (Signed)
2x INT attempt by this RN. [place], RN at bedside to attempt ultrasound INT.

## 2020-09-09 NOTE — ED Notes (Signed)
Patient is resting comfortably. Patient updated on POC and pending ultrasound.

## 2020-09-09 NOTE — ED Provider Notes (Signed)
[facility] Emergency Department Provider Note  ____________________________________________  Time seen: Approximately 3:03 AM  I have reviewed the triage vital signs and the nursing notes.   HISTORY  Chief Complaint Chest Pain   HPI Priscilla King is a 45 y.o. female with a history of diet-controlled diabetes, polycystic ovaries, hyperlipidemia, elevated BMI who presents for evaluation of epigastric abdominal pain.  Patient reports that she was at home when she developed sudden onset of epigastric abdominal pain.  She describes the pain as squeezing and tightness that started epigastric region radiating up into her chest and across into her upper back.  She reports that the pain initially was severe but at this time he has improved but not fully resolved.  She reports that she had difficulty breathing with that due to the intensity of the pain.  She denies dizziness, nausea, vomiting associated with it.  She denies any personal or family history of heart disease, PE or DVT, recent travel immobilization, leg pain or swelling, hemoptysis or exogenous hormones.  She is not a smoker.  She denies any prior abdominal surgeries.  She does report that she has been having intermittent discomfort in her epigastric region over the last week which she describes as "butterflies sensation", and she attributed that to maybe indigestion  Past Medical History:  Diagnosis Date  . Diabetes mellitus type 2, uncontrolled, without complications 35/3299  . History of polycystic ovaries   . Hyperlipidemia LDL goal <70 06/2014  . Kienbock's avascular necrosis of lunate, adult   . Obesity   . Seronegative arthritis    possible, [place], [place]    Patient Active Problem List   Diagnosis Date Noted  . Major depression, recurrent ([place]) 09/16/2019  . Mild postpartum depression 10/14/2017  . Obesity 11/01/2016  . Uterine fibroid 08/29/2016  . Hyperlipidemia LDL goal <70   . Kienbock's  avascular necrosis of lunate, adult   . Diabetes mellitus type 2, uncontrolled ([place]) 06/22/2014  . PCOS (polycystic ovarian syndrome) 04/15/2012  . OTHER SPECIFIED FORMS OF HEARING LOSS 03/10/2009  . PATELLO-FEMORAL SYNDROME 04/16/2008    Past Surgical History:  Procedure Laterality Date  . CESAREAN SECTION N/A 08/31/2012   Procedure: CESAREAN SECTION;  Surgeon: Mora Bellman, MD;  Location: [facility];  Service: Obstetrics;  Laterality: N/A;  . CESAREAN SECTION N/A 03/20/2017   Procedure: REPEAT CESAREAN SECTION;  Surgeon: Rubie Maid, MD;  Location: [facility];  Service: Obstetrics;  Laterality: N/A;  Female born @ 1500 Apgars: 8/9 Weight:10lbs 9ozs    Prior to Admission medications   Medication Sig Start Date End Date Taking? Authorizing Provider  ondansetron (ZOFRAN ODT) 4 MG disintegrating tablet Take 1 tablet (4 mg total) by mouth every 8 (eight) hours as needed. 09/09/20  Yes Traeton Bordas, [place], MD  azithromycin (ZITHROMAX) 250 MG tablet Take 2 tabs today, then take 1 tab daily until gone. 07/14/20   Montine Circle, PA-C  benzonatate (TESSALON) 100 MG capsule Take 1 capsule (100 mg total) by mouth 2 (two) times daily as needed for cough. 07/14/20   Montine Circle, PA-C  clonazePAM (KLONOPIN) 0.5 MG tablet TAKE 1 TABLET BY MOUTH 2 TIMES DAILY. 09/21/19   Copland, Frederico Hamman, MD  diclofenac sodium (VOLTAREN) 1 % GEL Apply 2 g topically 4 (four) times daily. 10/14/17   Copland, Frederico Hamman, MD  sertraline (ZOLOFT) 50 MG tablet TAKE 3 TABLETS BY MOUTH EVERY DAY 08/29/20   Owens Loffler, MD    Allergies Vicodin [hydrocodone-acetaminophen]  Family History  Problem Relation  Age of Onset  . Stroke Mother        CVA (TIA)  . Diabetes Mother   . Diabetes Maternal Grandmother   . Hypertension Maternal Grandmother   . Diabetes Paternal Grandmother   . Hypertension Paternal Grandmother     Social History Social History   Tobacco Use  . Smoking status: Never Smoker  . Smokeless tobacco:  Never Used  Vaping Use  . Vaping Use: Never used  Substance Use Topics  . Alcohol use: No  . Drug use: No    Review of Systems  Constitutional: Negative for fever. Eyes: Negative for visual changes. ENT: Negative for sore throat. Neck: No neck pain  Cardiovascular: Negative for chest pain. Respiratory: Negative for shortness of breath. Gastrointestinal: + epigastric abdominal pain. No vomiting or diarrhea. Genitourinary: Negative for dysuria. Musculoskeletal: Negative for back pain. Skin: Negative for rash. Neurological: Negative for headaches, weakness or numbness. Psych: No SI or HI  ____________________________________________   PHYSICAL EXAM:  VITAL SIGNS: Vitals:   09/09/20 0134 09/09/20 0145  BP: 135/85   Pulse: 87 86  Resp: 18 (!) 21  Temp:    SpO2: 100% 100%    Constitutional: Alert and oriented. Well appearing and in no apparent distress. HEENT:      Head: Normocephalic and atraumatic.         Eyes: Conjunctivae are normal. Sclera is non-icteric.       Mouth/Throat: Mucous membranes are moist.       Neck: Supple with no signs of meningismus. Cardiovascular: Regular rate and rhythm. No murmurs, gallops, or rubs. 2+ symmetrical distal pulses are present in all extremities. No JVD. Respiratory: Normal respiratory effort. Lungs are clear to auscultation bilaterally. No wheezes, crackles, or rhonchi.  Gastrointestinal: Soft, non tender, and non distended with positive bowel sounds. No rebound or guarding. Genitourinary: No CVA tenderness. Musculoskeletal: Nontender with normal range of motion in all extremities. No edema, cyanosis, or erythema of extremities. Neurologic: Normal speech and language. Face is symmetric. Moving all extremities. No gross focal neurologic deficits are appreciated. Skin: Skin is warm, dry and intact. No rash noted. Psychiatric: Mood and affect are normal. Speech and behavior are normal.  ____________________________________________    LABS (all labs ordered are listed, but only abnormal results are displayed)  Labs Reviewed  BASIC METABOLIC PANEL - Abnormal; Notable for the following components:      Result Value   Sodium 134 (*)    Potassium 3.4 (*)    CO2 21 (*)    Glucose, Bld 214 (*)    All other components within normal limits  CBC - Abnormal; Notable for the following components:   WBC 10.8 (*)    MCH 25.5 (*)    All other components within normal limits  HEPATIC FUNCTION PANEL - Abnormal; Notable for the following components:   Total Protein 8.2 (*)    All other components within normal limits  LIPASE, BLOOD  POC URINE PREG, ED  TROPONIN I (HIGH SENSITIVITY)  TROPONIN I (HIGH SENSITIVITY)   ____________________________________________  EKG  ED ECG REPORT I, Rudene Re, the attending physician, personally viewed and interpreted this ECG.  Sinus rhythm, rate of 80, normal intervals, right axis deviation, no ST elevations or depressions. ____________________________________________  RADIOLOGY  I have personally reviewed the images performed during this visit and I agree with the Radiologist's read.   Interpretation by Radiologist:  DG Chest 2 View  Result Date: 09/08/2020 CLINICAL DATA:  Sudden onset of chest pain. EXAM:  CHEST - 2 VIEW COMPARISON:  None FINDINGS: The heart size and mediastinal contours are within normal limits. No pleural effusion or interstitial edema. 1.2 cm nodular opacity is noted in the left upper lobe. The visualized skeletal structures are unremarkable. IMPRESSION: 1. No signs of pneumonia. 2. Left upper lobe nodular opacity. Consider further evaluation with nonemergent CT of the chest. Electronically Signed   By: Kerby Moors M.D.   On: 09/08/2020 20:33   CT Angio Chest/Abd/Pel for Dissection W and/or Wo Contrast  Result Date: 09/09/2020 CLINICAL DATA:  Sudden onset of chest pain radiating to the back. EXAM: CT ANGIOGRAPHY CHEST, ABDOMEN AND PELVIS TECHNIQUE:  Non-contrast CT of the chest was initially obtained. Multidetector CT imaging through the chest, abdomen and pelvis was performed using the standard protocol during bolus administration of intravenous contrast. Multiplanar reconstructed images and MIPs were obtained and reviewed to evaluate the vascular anatomy. CONTRAST:  172mL OMNIPAQUE IOHEXOL 350 MG/ML SOLN COMPARISON:  Radiograph earlier today. FINDINGS: CTA CHEST FINDINGS Cardiovascular: Normal aortic caliber without aneurysm. Noncontrast exam without aortic hematoma. There is no evidence of dissection, acute aortic syndrome or vasculitis. No significant atherosclerosis. Conventional branching pattern from the aortic arch. There are no filling defects in the pulmonary arteries to the segmental level to suggest pulmonary embolus. Heart is normal in size. No pericardial effusion Mediastinum/Nodes: No enlarged mediastinal or hilar lymph nodes. No thyroid nodule. Decompressed esophagus. Lungs/Pleura: No consolidation. No pulmonary edema. No pleural fluid. 3 mm pleural based right upper lobe nodule, series 6, image 27. trachea and central bronchi are patent Musculoskeletal: There are no acute or suspicious osseous abnormalities. Review of the MIP images confirms the above findings. CTA ABDOMEN AND PELVIS FINDINGS VASCULAR Aorta: Normal caliber aorta without aneurysm, dissection, vasculitis or significant stenosis. Celiac: Patent without evidence of aneurysm, dissection, vasculitis or significant stenosis. SMA: Patent without evidence of aneurysm, dissection, vasculitis or significant stenosis. Renals: Both renal arteries are patent without evidence of aneurysm, dissection, vasculitis, fibromuscular dysplasia or significant stenosis. IMA: Patent without evidence of aneurysm, dissection, vasculitis or significant stenosis. Inflow: Patent without evidence of aneurysm, dissection, vasculitis or significant stenosis. Veins: No obvious venous abnormality within the  limitations of this arterial phase study. Incidental retroaortic left renal vein. Review of the MIP images confirms the above findings. NON-VASCULAR Hepatobiliary: No focal liver abnormality is seen. Small gallstone in the gallbladder neck. Probable gallbladder wall thickening at 5 mm. No definite pericholecystic fat stranding. There is no biliary dilatation. Pancreas: No ductal dilatation or inflammation. Spleen: Normal in size and arterial enhancement. Adrenals/Urinary Tract: Normal adrenal glands. No hydronephrosis or perinephric edema. Homogeneous renal enhancement. Urinary bladder is physiologically distended without wall thickening. Stomach/Bowel: Unremarkable stomach. No bowel obstruction or inflammation. Normal appendix. Small to moderate colonic stool burden. Lymphatic: No enlarged lymph nodes in the abdomen or pelvis. Reproductive: Uterus and bilateral adnexa are unremarkable. Other: No free air or free fluid. Musculoskeletal: Degenerative disc disease at L5-S1. There are no acute or suspicious osseous abnormalities. Review of the MIP images confirms the above findings. IMPRESSION: 1. Normal thoracoabdominal aorta without dissection or acute aortic abnormality. No pulmonary embolus. 2. Gallstone in the gallbladder neck with probable gallbladder wall thickening at 5 mm. No definite pericholecystic fat stranding. If there is clinical concern for acute cholecystitis, recommend right upper quadrant ultrasound. 3. Degenerative disc disease at L5-S1. 4. A 3 mm pleural based right upper lobe nodule. No follow-up needed if patient is low-risk. Non-contrast chest CT can be considered in 12 months if  patient is high-risk. This recommendation follows the consensus statement: Guidelines for Management of Incidental Pulmonary Nodules Detected on CT Images: From the Fleischner Society 2017; Radiology 2017; 284:228-243. Electronically Signed   By: Keith Rake M.D.   On: 09/09/2020 01:10   US ABDOMEN LIMITED RUQ  (LIVER/GB)  Result Date: 09/09/2020 CLINICAL DATA:  Epigastric pain EXAM: ULTRASOUND ABDOMEN LIMITED RIGHT UPPER QUADRANT COMPARISON:  CT abdomen pelvis 09/09/2020 FINDINGS: Gallbladder: Gallstones within the gallbladder lumen. No pericholecystic fluid or gallbladder wall thickening visualized. No sonographic Murphy sign noted by sonographer. Common bile duct: Diameter: 3 mm Liver: No focal lesion identified. Within normal limits in parenchymal echogenicity. Portal vein is patent on color Doppler imaging with normal direction of blood flow towards the liver. Other: None. IMPRESSION: Cholelithiasis with no findings of acute cholecystitis or choledocholithiasis. Electronically Signed   By: Iven Finn M.D.   On: 09/09/2020 04:07     ____________________________________________   PROCEDURES  Procedure(s) performed:yes .1-3 Lead EKG Interpretation Performed by: Rudene Re, MD Authorized by: Rudene Re, MD     Interpretation: normal     ECG rate assessment: normal     Rhythm: sinus rhythm     Ectopy: none     Critical Care performed:  None ____________________________________________   INITIAL IMPRESSION / ASSESSMENT AND PLAN / ED COURSE  45 y.o. female with a history of diet-controlled diabetes, polycystic ovaries, hyperlipidemia, elevated BMI who presents for evaluation of epigastric abdominal pain that radiated up into her chest and across her upper back associated with SOB. Pain has improved considerably but she is still reporting some pain.  There my evaluation vitals are within normal limits, abdomen is soft with no tenderness, pulses equal in all 4 extremities, heart regular rate and rhythm with no murmurs, lungs are clear to auscultation.  Initial EKG showing sinus tachycardia however as the pain subsided a repeat EKG did not show any signs of ischemia or tachycardia.  Patient had 2 high-sensitivity troponins that were negative.  LFTs and lipase within normal limits,  no significant electrolyte derangements, mildly elevated white count of 10.8, negative pregnancy test.  She underwent a CT angio of the chest to rule out dissection due to the description of the pain.  CT was visualized by me with no signs of vascular abnormalities.  Radiology concern for possible gallstone in the gallbladder neck and may be gallbladder wall thickening.  Ultrasound was recommended which is pending.  IV Toradol given for pain.  Patient placed on telemetry for close monitoring of cardiorespiratory status.  Old medical records reviewed  _________________________ 4:16 AM on 09/09/2020 -----------------------------------------  Right upper quadrant ultrasound showing cholelithiasis with no evidence of cholecystitis.  Serial abdominal exams remain benign with no tenderness to palpation.  Discussed dietary changes and follow-up with surgery for outpatient cholecystectomy.  Discussed my standard return precautions.     _____________________________________________ Please note:  Patient was evaluated in Emergency Department today for the symptoms described in the history of present illness. Patient was evaluated in the context of the global COVID-19 pandemic, which necessitated consideration that the patient might be at risk for infection with the SARS-CoV-2 virus that causes COVID-19. Institutional protocols and algorithms that pertain to the evaluation of patients at risk for COVID-19 are in a state of rapid change based on information released by regulatory bodies including the CDC and federal and state organizations. These policies and algorithms were followed during the patient's care in the ED.  Some ED evaluations and interventions may be delayed  as a result of limited staffing during the pandemic.   [place] Controlled Substance Database was reviewed by me. ____________________________________________   FINAL CLINICAL IMPRESSION(S) / ED DIAGNOSES   Final diagnoses:  Epigastric  abdominal pain  Calculus of gallbladder without cholecystitis without obstruction      NEW MEDICATIONS STARTED DURING THIS VISIT:  ED Discharge Orders         Ordered    ondansetron (ZOFRAN ODT) 4 MG disintegrating tablet  Every 8 hours PRN        09/09/20 0416           Note:  This document was prepared using Dragon voice recognition software and may include unintentional dictation errors.    Alfred Levins, [place], MD 09/09/20 [phone]

## 2020-09-09 NOTE — Telephone Encounter (Signed)
Can you help set her up and appointment for Monday?  If there is a way to do it, see if you can figure out who told her that my next appointment is not until March.  Thanks so much!

## 2020-09-12 ENCOUNTER — Other Ambulatory Visit: Payer: Self-pay

## 2020-09-12 ENCOUNTER — Ambulatory Visit (INDEPENDENT_AMBULATORY_CARE_PROVIDER_SITE_OTHER): Payer: BC Managed Care – PPO | Admitting: Family Medicine

## 2020-09-12 ENCOUNTER — Encounter: Payer: Self-pay | Admitting: Family Medicine

## 2020-09-12 ENCOUNTER — Other Ambulatory Visit (INDEPENDENT_AMBULATORY_CARE_PROVIDER_SITE_OTHER): Payer: BC Managed Care – PPO

## 2020-09-12 VITALS — BP 120/80 | HR 103 | Temp 98.1°F | Ht 66.0 in | Wt 239.8 lb

## 2020-09-12 DIAGNOSIS — Z79899 Other long term (current) drug therapy: Secondary | ICD-10-CM

## 2020-09-12 DIAGNOSIS — E785 Hyperlipidemia, unspecified: Secondary | ICD-10-CM | POA: Diagnosis not present

## 2020-09-12 DIAGNOSIS — E1165 Type 2 diabetes mellitus with hyperglycemia: Secondary | ICD-10-CM | POA: Diagnosis not present

## 2020-09-12 DIAGNOSIS — R1013 Epigastric pain: Secondary | ICD-10-CM | POA: Diagnosis not present

## 2020-09-12 DIAGNOSIS — Z791 Long term (current) use of non-steroidal anti-inflammatories (NSAID): Secondary | ICD-10-CM

## 2020-09-12 DIAGNOSIS — K8020 Calculus of gallbladder without cholecystitis without obstruction: Secondary | ICD-10-CM | POA: Diagnosis not present

## 2020-09-12 LAB — BASIC METABOLIC PANEL
BUN: 12 mg/dL (ref 6–23)
CO2: 22 mEq/L (ref 19–32)
Calcium: 9.8 mg/dL (ref 8.4–10.5)
Chloride: 100 mEq/L (ref 96–112)
Creatinine, Ser: 0.64 mg/dL (ref 0.40–1.20)
GFR: 107.36 mL/min (ref >60.00)
Glucose, Bld: 269 mg/dL — ABNORMAL HIGH (ref 70–99)
Potassium: 4.5 mEq/L (ref 3.5–5.1)
Sodium: 132 mEq/L — ABNORMAL LOW (ref 135–145)

## 2020-09-12 LAB — CBC WITH DIFFERENTIAL/PLATELET
Basophils Absolute: 0.1 10*3/uL (ref 0.0–0.1)
Basophils Relative: 1.3 % (ref 0.0–3.0)
Eosinophils Absolute: 0.1 10*3/uL (ref 0.0–0.7)
Eosinophils Relative: 1.9 % (ref 0.0–5.0)
HCT: 40.5 % (ref 36.0–46.0)
Hemoglobin: 13.1 g/dL (ref 12.0–15.0)
Lymphocytes Relative: 31.9 % (ref 12.0–46.0)
Lymphs Abs: 2.4 10*3/uL (ref 0.7–4.0)
MCHC: 32.3 g/dL (ref 30.0–36.0)
MCV: 80.1 fl (ref 78.0–100.0)
Monocytes Absolute: 0.5 10*3/uL (ref 0.1–1.0)
Monocytes Relative: 6.4 % (ref 3.0–12.0)
Neutro Abs: 4.3 10*3/uL (ref 1.4–7.7)
Neutrophils Relative %: 58.5 % (ref 43.0–77.0)
Platelets: 283 10*3/uL (ref 150.0–400.0)
RBC: 5.06 Mil/uL (ref 3.87–5.11)
RDW: 14.7 % (ref 11.5–15.5)
WBC: 7.4 10*3/uL (ref 4.0–10.5)

## 2020-09-12 LAB — LIPID PANEL
Cholesterol: 234 mg/dL — ABNORMAL HIGH (ref 0–200)
HDL: 59.7 mg/dL (ref >39.00)
LDL Cholesterol: 158 mg/dL — ABNORMAL HIGH (ref 0–99)
NonHDL: 174.23
Total CHOL/HDL Ratio: 4
Triglycerides: 82 mg/dL (ref 0.0–149.0)
VLDL: 16.4 mg/dL (ref 0.0–40.0)

## 2020-09-12 LAB — HEPATIC FUNCTION PANEL
ALT: 144 U/L — ABNORMAL HIGH (ref 0–35)
AST: 28 U/L (ref 0–37)
Albumin: 4.3 g/dL (ref 3.5–5.2)
Alkaline Phosphatase: 120 U/L — ABNORMAL HIGH (ref 39–117)
Bilirubin, Direct: 0.1 mg/dL (ref 0.0–0.3)
Total Bilirubin: 0.4 mg/dL (ref 0.2–1.2)
Total Protein: 8.1 g/dL (ref 6.0–8.3)

## 2020-09-12 LAB — HEMOGLOBIN A1C: Hgb A1c MFr Bld: 10 % — ABNORMAL HIGH (ref 4.6–6.5)

## 2020-09-12 NOTE — Patient Instructions (Signed)
Omeprazole 20 mg 1 tablet by mouth twice a day for 2 months

## 2020-09-12 NOTE — Progress Notes (Signed)
Maddux Vanscyoc T. Zakhai Meisinger, MD, [place] at [facility] [place], [address]  Phone: [phone]  FAX: [phone]  Priscilla King - 45 y.o. female  MRN [id number]  Date of Birth: 1976/05/16  Date: 09/12/2020  PCP: Owens Loffler, MD  Referral: Owens Loffler, MD  Acute ER follow-up:  This visit occurred during the SARS-CoV-2 public health emergency.  Safety protocols were in place, including screening questions prior to the visit, additional usage of staff PPE, and extensive cleaning of exam room while observing appropriate contact time as indicated for disinfecting solutions.   Subjective:   Priscilla King is a 45 y.o. very pleasant female patient with Body mass index is 38.7 kg/m. who presents with the following:  Acute ER follow-up from 09/09/2020:  Acute epigastric pain.  During the ER stay, multiple labs were checked, and the patient also had a number of radiological and exams including a right upper quadrant ultrasound, chest x-ray, as well as a CT angiogram of the chest abdomen and pelvis.  At that time, there was some concern about potential cholecystitis, the patient does have cholelithiasis on ultrasound.  She did have a normal lipase.  She also had a normal bilirubin and the entirety of her hepatic function panel is normal.  She had a white blood cell count of 10.8 only.  Neutrophil count was normal.  CT of the chest due to 1.2 cm nodule visualized on chest x-ray.- has been done, nothing to follow-up - results have been reviewed.  No additional follow-up is needed, as she is low-risk. Low risk  Gallbladder wall thickening - gallstone in the gallbladder neck -this was seen on the patient's CT, but this appears normal on the patient's right upper quadrant ultrasound. Cholelithiasis Lipase was normal  Review of Systems is noted in the HPI, as appropriate  Objective:    BP 120/80   Pulse (!) 103   Temp 98.1 F (36.7 C) (Temporal)   Ht 5\' 6"  (1.676 m)   Wt 239 lb 12 oz (108.7 kg)   LMP 08/19/2020   SpO2 97%   BMI 38.70 kg/m   GEN: No acute distress; alert,appropriate. PULM: Breathing comfortably in no respiratory distress PSYCH: Normally interactive.   On GI exam, the patient does have some epigastric tenderness.  She has no tenderness in the right upper quadrant.  No tenderness in the left upper quadrant, hypogastric region or right lower quadrant or left lower quadrant.  No distention.  Positive bowel sounds.  Laboratory and Imaging Data: All labs from the hospital encounter have been reviewed.  DG Chest 2 View  Result Date: 09/08/2020 CLINICAL DATA:  Sudden onset of chest pain. EXAM: CHEST - 2 VIEW COMPARISON:  None FINDINGS: The heart size and mediastinal contours are within normal limits. No pleural effusion or interstitial edema. 1.2 cm nodular opacity is noted in the left upper lobe. The visualized skeletal structures are unremarkable. IMPRESSION: 1. No signs of pneumonia. 2. Left upper lobe nodular opacity. Consider further evaluation with nonemergent CT of the chest. Electronically Signed   By: Kerby Moors M.D.   On: 09/08/2020 20:33   CT Angio Chest/Abd/Pel for Dissection W and/or Wo Contrast  Result Date: 09/09/2020 CLINICAL DATA:  Sudden onset of chest pain radiating to the back. EXAM: CT ANGIOGRAPHY CHEST, ABDOMEN AND PELVIS TECHNIQUE: Non-contrast CT of the chest was initially obtained. Multidetector CT imaging through the chest, abdomen and pelvis  was performed using the standard protocol during bolus administration of intravenous contrast. Multiplanar reconstructed images and MIPs were obtained and reviewed to evaluate the vascular anatomy. CONTRAST:  121mL OMNIPAQUE IOHEXOL 350 MG/ML SOLN COMPARISON:  Radiograph earlier today. FINDINGS: CTA CHEST FINDINGS Cardiovascular: Normal aortic caliber without aneurysm. Noncontrast exam  without aortic hematoma. There is no evidence of dissection, acute aortic syndrome or vasculitis. No significant atherosclerosis. Conventional branching pattern from the aortic arch. There are no filling defects in the pulmonary arteries to the segmental level to suggest pulmonary embolus. Heart is normal in size. No pericardial effusion Mediastinum/Nodes: No enlarged mediastinal or hilar lymph nodes. No thyroid nodule. Decompressed esophagus. Lungs/Pleura: No consolidation. No pulmonary edema. No pleural fluid. 3 mm pleural based right upper lobe nodule, series 6, image 27. trachea and central bronchi are patent Musculoskeletal: There are no acute or suspicious osseous abnormalities. Review of the MIP images confirms the above findings. CTA ABDOMEN AND PELVIS FINDINGS VASCULAR Aorta: Normal caliber aorta without aneurysm, dissection, vasculitis or significant stenosis. Celiac: Patent without evidence of aneurysm, dissection, vasculitis or significant stenosis. SMA: Patent without evidence of aneurysm, dissection, vasculitis or significant stenosis. Renals: Both renal arteries are patent without evidence of aneurysm, dissection, vasculitis, fibromuscular dysplasia or significant stenosis. IMA: Patent without evidence of aneurysm, dissection, vasculitis or significant stenosis. Inflow: Patent without evidence of aneurysm, dissection, vasculitis or significant stenosis. Veins: No obvious venous abnormality within the limitations of this arterial phase study. Incidental retroaortic left renal vein. Review of the MIP images confirms the above findings. NON-VASCULAR Hepatobiliary: No focal liver abnormality is seen. Small gallstone in the gallbladder neck. Probable gallbladder wall thickening at 5 mm. No definite pericholecystic fat stranding. There is no biliary dilatation. Pancreas: No ductal dilatation or inflammation. Spleen: Normal in size and arterial enhancement. Adrenals/Urinary Tract: Normal adrenal glands. No  hydronephrosis or perinephric edema. Homogeneous renal enhancement. Urinary bladder is physiologically distended without wall thickening. Stomach/Bowel: Unremarkable stomach. No bowel obstruction or inflammation. Normal appendix. Small to moderate colonic stool burden. Lymphatic: No enlarged lymph nodes in the abdomen or pelvis. Reproductive: Uterus and bilateral adnexa are unremarkable. Other: No free air or free fluid. Musculoskeletal: Degenerative disc disease at L5-S1. There are no acute or suspicious osseous abnormalities. Review of the MIP images confirms the above findings. IMPRESSION: 1. Normal thoracoabdominal aorta without dissection or acute aortic abnormality. No pulmonary embolus. 2. Gallstone in the gallbladder neck with probable gallbladder wall thickening at 5 mm. No definite pericholecystic fat stranding. If there is clinical concern for acute cholecystitis, recommend right upper quadrant ultrasound. 3. Degenerative disc disease at L5-S1. 4. A 3 mm pleural based right upper lobe nodule. No follow-up needed if patient is low-risk. Non-contrast chest CT can be considered in 12 months if patient is high-risk. This recommendation follows the consensus statement: Guidelines for Management of Incidental Pulmonary Nodules Detected on CT Images: From the Fleischner Society 2017; Radiology 2017; 284:228-243. Electronically Signed   By: Keith Rake M.D.   On: 09/09/2020 01:10   US ABDOMEN LIMITED RUQ (LIVER/GB)  Result Date: 09/09/2020 CLINICAL DATA:  Epigastric pain EXAM: ULTRASOUND ABDOMEN LIMITED RIGHT UPPER QUADRANT COMPARISON:  CT abdomen pelvis 09/09/2020 FINDINGS: Gallbladder: Gallstones within the gallbladder lumen. No pericholecystic fluid or gallbladder wall thickening visualized. No sonographic Murphy sign noted by sonographer. Common bile duct: Diameter: 3 mm Liver: No focal lesion identified. Within normal limits in parenchymal echogenicity. Portal vein is patent on color Doppler imaging  with normal direction of blood flow towards the liver.  Other: None. IMPRESSION: Cholelithiasis with no findings of acute cholecystitis or choledocholithiasis. Electronically Signed   By: Iven Finn M.D.   On: 09/09/2020 04:07     Assessment and Plan:     ICD-10-CM   1. Epigastric abdominal pain  R10.13   2. Uncontrolled type 2 diabetes mellitus with hyperglycemia (HCC)  E11.65 Microalbumin / creatinine urine ratio  3. Calculus of gallbladder without cholecystitis without obstruction  K80.20   4. NSAID long-term use  Z79.1    She has continued epigastric abdominal pain.  She also has cholelithiasis without an elevation of her bilirubin, normal lipase, normal white blood cell count, and a normal right upper quadrant ultrasound with the exception of gallstones.  I wonder if more of her symptoms could be from ulcer versus gastritis.  She does take NSAIDs daily.  She continues to have epigastric tenderness.  I am going to have the patient take Prilosec p.o. twice daily.  She has an upcoming physical with me.  We will see how she is doing at that point.  Certainly this could all be cholelithiasis, but there is no evidence of cholecystitis at all.  If she does have recurrence of symptoms then consideration of surgical consultation is reasonable and appropriate.  Her A1c came back at 10, and at this point she is off of all medication.  I am going to titrate up her Metformin dose and follow-up on her upcoming appointment.  No orders of the defined types were placed in this encounter.  Medications Discontinued During This Encounter  Medication Reason  . azithromycin (ZITHROMAX) 250 MG tablet Completed Course  . benzonatate (TESSALON) 100 MG capsule Completed Course  . clonazePAM (KLONOPIN) 0.5 MG tablet Completed Course  . sertraline (ZOLOFT) 50 MG tablet Completed Course   No orders of the defined types were placed in this encounter.   Follow-up: No follow-ups on file.  Signed,  Priscilla Deed. Eurika Sandy, MD   Outpatient Encounter Medications as of 09/12/2020  Medication Sig  . diclofenac sodium (VOLTAREN) 1 % GEL Apply 2 g topically 4 (four) times daily.  . ondansetron (ZOFRAN ODT) 4 MG disintegrating tablet Take 1 tablet (4 mg total) by mouth every 8 (eight) hours as needed. (Patient not taking: Reported on 09/12/2020)  . [DISCONTINUED] azithromycin (ZITHROMAX) 250 MG tablet Take 2 tabs today, then take 1 tab daily until gone.  . [DISCONTINUED] benzonatate (TESSALON) 100 MG capsule Take 1 capsule (100 mg total) by mouth 2 (two) times daily as needed for cough.  . [DISCONTINUED] clonazePAM (KLONOPIN) 0.5 MG tablet TAKE 1 TABLET BY MOUTH 2 TIMES DAILY.  . [DISCONTINUED] sertraline (ZOLOFT) 50 MG tablet TAKE 3 TABLETS BY MOUTH EVERY DAY   No facility-administered encounter medications on file as of 09/12/2020.

## 2020-09-13 DIAGNOSIS — K8020 Calculus of gallbladder without cholecystitis without obstruction: Secondary | ICD-10-CM | POA: Insufficient documentation

## 2020-09-13 LAB — MICROALBUMIN / CREATININE URINE RATIO
Creatinine,U: 132.4 mg/dL
Microalb Creat Ratio: 1.3 mg/g (ref 0.0–30.0)
Microalb, Ur: 1.7 mg/dL (ref 0.0–1.9)

## 2020-09-13 MED ORDER — METFORMIN HCL ER 500 MG PO TB24: 1500.0000 mg | ORAL_TABLET | Freq: Every day | ORAL | 2 refills | Status: DC

## 2020-09-21 ENCOUNTER — Encounter: Payer: BC Managed Care – PPO | Admitting: Family Medicine

## 2020-09-28 MED ORDER — GLYBURIDE 5 MG PO TABS: ORAL_TABLET | ORAL | 2 refills | Status: DC

## 2020-10-24 ENCOUNTER — Ambulatory Visit: Payer: BC Managed Care – PPO | Admitting: Family Medicine

## 2020-11-07 ENCOUNTER — Ambulatory Visit: Payer: BC Managed Care – PPO | Admitting: Family Medicine

## 2020-12-16 ENCOUNTER — Other Ambulatory Visit: Payer: Self-pay | Admitting: Family Medicine

## 2020-12-26 ENCOUNTER — Other Ambulatory Visit: Payer: BC Managed Care – PPO

## 2020-12-26 ENCOUNTER — Other Ambulatory Visit: Payer: Self-pay | Admitting: Family Medicine

## 2020-12-26 DIAGNOSIS — Z1159 Encounter for screening for other viral diseases: Secondary | ICD-10-CM

## 2020-12-26 DIAGNOSIS — E785 Hyperlipidemia, unspecified: Secondary | ICD-10-CM

## 2020-12-26 DIAGNOSIS — E119 Type 2 diabetes mellitus without complications: Secondary | ICD-10-CM

## 2020-12-26 DIAGNOSIS — Z79899 Other long term (current) drug therapy: Secondary | ICD-10-CM

## 2020-12-26 DIAGNOSIS — R5383 Other fatigue: Secondary | ICD-10-CM

## 2021-01-02 ENCOUNTER — Encounter: Payer: BC Managed Care – PPO | Admitting: Family Medicine

## 2021-01-31 ENCOUNTER — Encounter: Payer: BC Managed Care – PPO | Admitting: Obstetrics and Gynecology

## 2021-02-01 ENCOUNTER — Encounter: Payer: Self-pay | Admitting: Family Medicine

## 2021-02-01 NOTE — Progress Notes (Signed)
Garyn Arlotta T. Wyatt Galvan, MD, [place] at [facility] [place], [address]  Phone: [phone]  FAX: [phone]  Priscilla King - 46 y.o. female  MRN [id number]  Date of Birth: 1975-11-06  Date: 02/02/2021  PCP: Owens Loffler, MD  Referral: Owens Loffler, MD  Chief Complaint  Patient presents with  . Foot Pain    Right    This visit occurred during the SARS-CoV-2 public health emergency.  Safety protocols were in place, including screening questions prior to the visit, additional usage of staff PPE, and extensive cleaning of exam room while observing appropriate contact time as indicated for disinfecting solutions.   Subjective:   Priscilla King is a 45 y.o. very pleasant female patient with Body mass index is 40.03 kg/m. who presents with the following:  She presents with some ongoing foot pain she also has a number of other issues including multiple joint pains.  Pain with a ball on her foot.  Will have some PF on the R This is right-sided calcaneal pain on the plantar aspect.  First noticed it with going off her ibuprofen.  2 or 3 months. Maybe more.  Had been on Meloxicam She had some concern about potential cardiac risk with NSAIDs.  R side that is bad. Everything is worse since stopping her NSAIDs. She really has multiple joint involvement on the right.  She does have some chronic right-sided wrist pain secondary to con box, but she has additional pain in the knees as well as the feet and ankles. -I did have her see Dr. Jefm Bryant from rheumatology at [facility] clinic number years ago.  He felt at that time that it would be possible for have a seronegative arthritis.  Not sleeping. Has been depressed some.  It is fine.  Still a lot of exhaustion.  Stress no and not being able to  2-3 hours a night.  Occ nap during the day. She had previously been on an SSRI with very increased panic attacks, but at  this point this is been stopped.  Has tried some Melatonin  Doxylamine did not help  Review of Systems is noted in the HPI, as appropriate  Patient Active Problem List   Diagnosis Date Noted  . Major depression, recurrent ([place]) 09/16/2019  . Hyperlipidemia LDL goal <70   . Kienbock's avascular necrosis of lunate, adult   . Diabetes mellitus type 2, uncontrolled ([place]) 06/22/2014  . PCOS (polycystic ovarian syndrome) 04/15/2012  . OTHER SPECIFIED FORMS OF HEARING LOSS 03/10/2009    Past Medical History:  Diagnosis Date  . Diabetes mellitus type 2, uncontrolled, without complications 30/0762  . History of polycystic ovaries   . Hyperlipidemia LDL goal <70 06/2014  . Kienbock's avascular necrosis of lunate, adult   . Seronegative arthritis    possible, [place], [place]    Past Surgical History:  Procedure Laterality Date  . CESAREAN SECTION N/A 08/31/2012   Procedure: CESAREAN SECTION;  Surgeon: Mora Bellman, MD;  Location: [facility];  Service: Obstetrics;  Laterality: N/A;  . CESAREAN SECTION N/A 03/20/2017   Procedure: REPEAT CESAREAN SECTION;  Surgeon: Rubie Maid, MD;  Location: [facility];  Service: Obstetrics;  Laterality: N/A;  Female born @ 1500 Apgars: 8/9 Weight:10lbs 9ozs    Family History  Problem Relation Age of Onset  . Stroke Mother        CVA (TIA)  . Diabetes Mother   . Diabetes Maternal Grandmother   .  Hypertension Maternal Grandmother   . Diabetes Paternal Grandmother   . Hypertension Paternal Grandmother      Objective:   BP 102/78   Pulse 91   Temp 97.6 F (36.4 C) (Temporal)   Ht 5\' 6"  (1.676 m)   Wt 248 lb (112.5 kg)   LMP 01/29/2021   SpO2 98%   BMI 40.03 kg/m   GEN: No acute distress; alert,appropriate. PULM: Breathing comfortably in no respiratory distress PSYCH: Normally interactive.    Foot exam: Right Echymosis: no Edema: Modest ROM: Mild decreased motion compared to the contralateral side Gait: Notably antalgic compared to multiple  other previous examinations MT pain: no Callus pattern: none Lateral Mall: NT Medial Mall: NT Talus: Mildly tender to palpation Navicular: NT Calcaneous: NT Metatarsals: NT 5th MT: NT Phalanges: NT Achilles: NT Plantar Fascia: tender, medial along PF. Pain with forced dorsi Fat Pad: NT Peroneals: NT Post Tib: NT Great Toe: Nml motion Ant Drawer: neg Sensation: intact   Right knee: Minimal effusion.  She does have pain with loading the medial lateral patellar facets without apprehension.  She does have some mild joint line tenderness.  Stable to varus and valgus stress.  Lachman and posterior drawer testing are negative.  Does have pain with forced flexion and McMurray's.  No mechanical symptoms on exam.  Right wrist: She does have pain in the true wrist joint and this is notably swollen compared to the contralateral side.  She is grossly nontender at the DIP, PIP, and MCP joints without appreciable synovitis.  She does have significant loss of motion on the right wrist approximately 35%.  She does have full range of motion at the elbow without any limitation or pain that is provokable.  Laboratory and Imaging Data: Results for orders placed or performed in visit on 02/02/21  Sedimentation rate  Result Value Ref Range   Sed Rate 63 (H) 0 - 20 mm/hr  High sensitivity CRP  Result Value Ref Range   CRP, High Sensitivity 16.370 (H) 0.000 - 5.000 mg/L     Assessment and Plan:     ICD-10-CM   1. Polyarthralgia  M25.50 Sedimentation rate    High sensitivity CRP    ANA Screen,IFA,Reflex Titer/Pattern,Reflex Mplx 11 Ab Cascade with IdentRA    2. Myalgia  M79.10 Sedimentation rate    High sensitivity CRP    ANA Screen,IFA,Reflex Titer/Pattern,Reflex Mplx 11 Ab Cascade with IdentRA    3. Plantar fasciitis, right  T61.4      She certainly has plantar fasciitis.  Reviewed rehab to to the [employer] [facility] [employer] [facility].  Made a number of other suggestions.  I think  that her diffuse polyarthralgia is worse with exacerbation, and she is notably antalgic with movements, rising from a seated position and walking.  I do appreciate multijoint involvement.  Broader rheumatological work-up is needed here.  At the time of this dictation, her sed rate and CRP have returned and are quite high.  I am going to have her restart her NSAIDs with daily Celebrex.  We reviewed potential absolute risk elevations for cardiovascular disease which are quite small.  I tried to answer all questions.  Elavil at night, hopefully this will help with sleep as well as some pain.  Meds ordered this encounter  Medications  . celecoxib (CELEBREX) 200 MG capsule    Sig: Take 1 capsule (200 mg total) by mouth daily.    Dispense:  30 capsule    Refill:  2  .  amitriptyline (ELAVIL) 10 MG tablet    Sig: Take 1-2 tablets (10-20 mg total) by mouth at bedtime.    Dispense:  60 tablet    Refill:  3   Medications Discontinued During This Encounter  Medication Reason  . ondansetron (ZOFRAN ODT) 4 MG disintegrating tablet No longer needed (for PRN medications)   Orders Placed This Encounter  Procedures  . Sedimentation rate  . High sensitivity CRP  . ANA Screen,IFA,Reflex Titer/Pattern,Reflex Mplx 11 Ab Cascade with IdentRA    Follow-up: Return in about 5 weeks (around 03/09/2021).  Signed,  Maud Deed. Leilani Cespedes, MD   Outpatient Encounter Medications as of 02/02/2021  Medication Sig  . amitriptyline (ELAVIL) 10 MG tablet Take 1-2 tablets (10-20 mg total) by mouth at bedtime.  . celecoxib (CELEBREX) 200 MG capsule Take 1 capsule (200 mg total) by mouth daily.  . diclofenac sodium (VOLTAREN) 1 % GEL Apply 2 g topically 4 (four) times daily.  Marland Kitchen glyBURIDE (DIABETA) 5 MG tablet Take 1/2 tab po bid for 1 week, then increase to 1/2 tab po in AM and 1 tab in the PM.  Increase to 1 po bid 1 week later.  . [DISCONTINUED] ondansetron (ZOFRAN ODT) 4 MG disintegrating tablet Take 1 tablet (4 mg  total) by mouth every 8 (eight) hours as needed. (Patient not taking: Reported on 09/12/2020)   No facility-administered encounter medications on file as of 02/02/2021.

## 2021-02-02 ENCOUNTER — Other Ambulatory Visit: Payer: Self-pay

## 2021-02-02 ENCOUNTER — Ambulatory Visit: Payer: BC Managed Care – PPO | Admitting: Family Medicine

## 2021-02-02 ENCOUNTER — Telehealth: Payer: Self-pay

## 2021-02-02 ENCOUNTER — Encounter: Payer: Self-pay | Admitting: Family Medicine

## 2021-02-02 VITALS — BP 102/78 | HR 91 | Temp 97.6°F | Ht 66.0 in | Wt 248.0 lb

## 2021-02-02 DIAGNOSIS — M791 Myalgia, unspecified site: Secondary | ICD-10-CM

## 2021-02-02 DIAGNOSIS — M2550 Pain in unspecified joint: Secondary | ICD-10-CM | POA: Diagnosis not present

## 2021-02-02 DIAGNOSIS — M722 Plantar fascial fibromatosis: Secondary | ICD-10-CM | POA: Diagnosis not present

## 2021-02-02 LAB — SEDIMENTATION RATE: Sed Rate: 63 mm/hr — ABNORMAL HIGH (ref 0–20)

## 2021-02-02 LAB — HIGH SENSITIVITY CRP: CRP, High Sensitivity: 16.37 mg/L — ABNORMAL HIGH (ref 0.000–5.000)

## 2021-02-02 MED ORDER — CELECOXIB 200 MG PO CAPS: 200.0000 mg | ORAL_CAPSULE | Freq: Every day | ORAL | 2 refills | Status: DC

## 2021-02-02 MED ORDER — AMITRIPTYLINE HCL 10 MG PO TABS: 10.0000 mg | ORAL_TABLET | Freq: Every day | ORAL | 3 refills | Status: DC

## 2021-02-02 NOTE — Patient Instructions (Signed)

## 2021-02-02 NOTE — Telephone Encounter (Signed)
After patient's visit this am, her husband came back in the office to discuss care concerns for his wife that she experienced in the lab.   Husband was calm and professional throughout the discussion.  He asked if we found it important for patients to be treated with dignity and respect and to be listened to.  I stated yes that is important and our utmost desire.  He went on to describe that his wife attempted to communicate to the person drawing her blood that she needed a butterfly.  He states that the lab person cut her off and responded, "that is up to me to decide" and continued to draw her blood (unsuccessfully) using her way and not his wife's.  He stated this resulted in his wife having to be stuck more than once and causing more discomfort than needed.  Lab person did end up using a butterfly device as his wife asked in the beginning and was successful.  Husband states that this has happened multiple times and now he feels that he has to come with her to all appts to protect her now as a result because we aren't listening to her.    Husband wanted me to know that the lab person did apologize to his wife at the end and that was appreciated but did feel like it was a little late.  He is wanting to share this experience so that it can be addressed and not happen again.    I apologized for the occurrence and re-stated the fact that we never want patients to feel this way and that I would address this further.  He thanked me and left the office.   Upon investigating, I did identify the lab person who drew the patient's blood today, Selinda Orion, phlebotomist/Xray Tech.  Terri and I discussed the communication break down.  Terri explained to me that given what was being drawn and the patient's anatomy, it is sometimes not the best choice to use a butterfly unless absolutely necessary.  But owns the fact that she did not appropriately communicate this to the patient to help them understand the why.  She did  apologize to the patient and has marked her chart for future draws to always use her hand/butterfly when appropriate for future draws to help them remember this request moving forward.

## 2021-02-02 NOTE — Telephone Encounter (Signed)
All reviewed.

## 2021-02-03 ENCOUNTER — Encounter: Payer: Self-pay | Admitting: Obstetrics and Gynecology

## 2021-02-05 ENCOUNTER — Encounter: Payer: Self-pay | Admitting: Family Medicine

## 2021-02-09 LAB — ANA SCREEN,IFA,REFLEX TITER/PATTERN,REFLEX MPLX 11 AB CASCADE
14-3-3 eta Protein: <0.2 ng/mL (ref <0.2)
Anti Nuclear Antibody (ANA): NEGATIVE
Cyclic Citrullin Peptide Ab: 16 UNITS
Rheumatoid fact SerPl-aCnc: <14 IU/mL (ref <14)

## 2021-02-13 DIAGNOSIS — R700 Elevated erythrocyte sedimentation rate: Secondary | ICD-10-CM

## 2021-02-13 DIAGNOSIS — R7982 Elevated C-reactive protein (CRP): Secondary | ICD-10-CM

## 2021-02-13 DIAGNOSIS — M1380 Other specified arthritis, unspecified site: Secondary | ICD-10-CM

## 2021-02-13 DIAGNOSIS — M2550 Pain in unspecified joint: Secondary | ICD-10-CM

## 2021-02-20 ENCOUNTER — Telehealth: Payer: Self-pay | Admitting: Family Medicine

## 2021-02-20 NOTE — Telephone Encounter (Signed)
Noted.   Will contact patient via Mychart message once referral is sent

## 2021-02-20 NOTE — Telephone Encounter (Signed)
New Rheum consultation

## 2021-02-20 NOTE — Telephone Encounter (Signed)
I put a consult in for the [facility].  Thank-you for your help.

## 2021-02-20 NOTE — Telephone Encounter (Signed)
Priscilla King called in checking in on a referral for rheumatologist.  Informed her that the referral coordinator will call with an update.

## 2021-02-20 NOTE — Addendum Note (Signed)
Addended by: Owens Loffler on: 02/20/2021 05:14 PM   Modules accepted: Orders

## 2021-02-24 ENCOUNTER — Other Ambulatory Visit: Payer: Self-pay | Admitting: Family Medicine

## 2021-03-01 ENCOUNTER — Encounter: Payer: Self-pay | Admitting: Family Medicine

## 2021-03-01 ENCOUNTER — Telehealth: Payer: BC Managed Care – PPO | Admitting: Family Medicine

## 2021-03-01 ENCOUNTER — Other Ambulatory Visit: Payer: Self-pay

## 2021-03-01 VITALS — Ht 66.0 in

## 2021-03-01 DIAGNOSIS — M2550 Pain in unspecified joint: Secondary | ICD-10-CM

## 2021-03-01 DIAGNOSIS — R7982 Elevated C-reactive protein (CRP): Secondary | ICD-10-CM | POA: Diagnosis not present

## 2021-03-01 DIAGNOSIS — R700 Elevated erythrocyte sedimentation rate: Secondary | ICD-10-CM | POA: Diagnosis not present

## 2021-03-01 DIAGNOSIS — M791 Myalgia, unspecified site: Secondary | ICD-10-CM

## 2021-03-01 DIAGNOSIS — E1165 Type 2 diabetes mellitus with hyperglycemia: Secondary | ICD-10-CM

## 2021-03-01 NOTE — Progress Notes (Signed)
Priscilla Housey T. Tracia Lacomb, MD Primary Care and [place] at [facility] [place], [address] Phone: [phone]  FAX: [phone]  Priscilla King - 45 y.o. female  MRN [id number]  Date of Birth: 05/12/1976  Visit Date: 03/01/2021  PCP: Owens Loffler, MD  Referred by: Owens Loffler, MD  Virtual Visit via Video Note:  I connected with  Priscilla King on 03/01/2021  8:40 AM EDT by a video enabled telemedicine application and verified that I am speaking with the correct person using two identifiers.   Location patient: home computer, tablet, or smartphone Location provider: work or home office Consent: Verbal consent directly obtained from [name]. Persons participating in the virtual visit: patient, provider  I discussed the limitations of evaluation and management by telemedicine and the availability of in person appointments. The patient expressed understanding and agreed to proceed.  Chief Complaint  Patient presents with   Follow-up    Right Foot    History of Present Illness:  Feeling better diffusely everywhere.  She has however, still having ongoing notable intractable pain and pain with ambulation in multiple joints.  She has pain in her wrist, knees, hips, as well as the ankle, predominantly on the right side.  There is no injury.  Last time I saw her I did do a large-scale ANA reflex panel as well as a sed rate and CRP.  Sed rate and CRP were quite high. Lab Results  Component Value Date   ESRSEDRATE 63 (H) 02/02/2021    CRP = 16  Getting up from pain and most of the night even on Elavil. Drowsy in the morning.  Not sleeping.   Walking a little bit better.   Still having some PF pain.   Last a1c - 10 Has tried to change diet pretty hard.  She was not eating well during her significant depression, and at her last office visit concerning diabetes, we were going to have her to  aggressively alter her diet.  Check BS bid for 5-7 days Pred 15 mg daily until 03/2021 Rheum appointment  Disability - needs a temp handic  Review of Systems as above: See pertinent positives and pertinent negatives per HPI No acute distress verbally   Observations/Objective/Exam:  An attempt was made to discern vital signs over the phone and per patient if applicable and possible.   General:    Alert, Oriented, appears well and in no acute distress  Pulmonary:     On inspection no signs of respiratory distress.  Psych / Neurological:     Pleasant and cooperative.  Assessment and Plan:    ICD-10-CM   1. Polyarthralgia  M25.50     2. Elevated C-reactive protein (CRP)  R79.82     3. Elevated sed rate  R70.0     4. Myalgia  M79.10     5. Uncontrolled type 2 diabetes mellitus with hyperglycemia (HCC)  E11.65      Suspect seronegative arthritis versus possible polymyalgia rheumatica.  I would like to put the patient on 15 mg of prednisone daily to check response.  She does have a rheumatology appointment in the middle of September.  Challenging in that her diabetes has been uncontrolled.  I am good to have her check her blood sugars twice daily and she is going to get back to me via MyChart in about 7 days.    If reasonably stable, I think that the potential  benefits might outweigh the potential risks to see response and alter plan of care prior to rheumatological consult.  I discussed the assessment and treatment plan with the patient. The patient was provided an opportunity to ask questions and all were answered. The patient agreed with the plan and demonstrated an understanding of the instructions.   The patient was advised to call back or seek an in-person evaluation if the symptoms worsen or if the condition fails to improve as anticipated.  Follow-up: prn unless noted otherwise below No follow-ups on file.  No orders of the defined types were placed in this  encounter.  No orders of the defined types were placed in this encounter.   Signed,  Maud Deed. Mozella Rexrode, MD

## 2021-03-08 ENCOUNTER — Ambulatory Visit: Payer: BC Managed Care – PPO | Admitting: Family Medicine

## 2021-03-14 MED ORDER — GLYBURIDE 5 MG PO TABS: 10.0000 mg | ORAL_TABLET | Freq: Two times a day (BID) | ORAL | 0 refills | Status: DC

## 2021-03-20 ENCOUNTER — Other Ambulatory Visit: Payer: Self-pay | Admitting: Family Medicine

## 2021-03-20 DIAGNOSIS — Z1231 Encounter for screening mammogram for malignant neoplasm of breast: Secondary | ICD-10-CM

## 2021-03-29 ENCOUNTER — Ambulatory Visit: Payer: Self-pay

## 2021-03-29 ENCOUNTER — Other Ambulatory Visit: Payer: Self-pay

## 2021-03-29 ENCOUNTER — Encounter: Payer: Self-pay | Admitting: Internal Medicine

## 2021-03-29 ENCOUNTER — Ambulatory Visit: Payer: BC Managed Care – PPO | Admitting: Internal Medicine

## 2021-03-29 VITALS — BP 134/94 | HR 84 | Ht 68.0 in | Wt 258.8 lb

## 2021-03-29 DIAGNOSIS — M25551 Pain in right hip: Secondary | ICD-10-CM

## 2021-03-29 DIAGNOSIS — M25561 Pain in right knee: Secondary | ICD-10-CM

## 2021-03-29 DIAGNOSIS — R700 Elevated erythrocyte sedimentation rate: Secondary | ICD-10-CM | POA: Insufficient documentation

## 2021-03-29 DIAGNOSIS — M931 Kienbock's disease of adults: Secondary | ICD-10-CM

## 2021-03-29 DIAGNOSIS — G8929 Other chronic pain: Secondary | ICD-10-CM | POA: Diagnosis not present

## 2021-03-29 DIAGNOSIS — M25571 Pain in right ankle and joints of right foot: Secondary | ICD-10-CM | POA: Insufficient documentation

## 2021-03-29 DIAGNOSIS — M79641 Pain in right hand: Secondary | ICD-10-CM | POA: Diagnosis not present

## 2021-03-29 DIAGNOSIS — M222X1 Patellofemoral disorders, right knee: Secondary | ICD-10-CM | POA: Insufficient documentation

## 2021-03-29 DIAGNOSIS — M25851 Other specified joint disorders, right hip: Secondary | ICD-10-CM | POA: Insufficient documentation

## 2021-03-29 NOTE — Progress Notes (Signed)
Office Visit Note  Patient: Priscilla King             Date of Birth: [birth year]           MRN: [id number]             PCP: Owens Loffler, MD Referring: Owens Loffler, MD Visit Date: 03/29/2021 Occupation: Priscilla King professor of statistics  Subjective:   History of Present Illness: Priscilla King is a 45 y.o. female here for possible seronegative inflammatory arthritis with pain at multiple sites. She previously saw Dr. Precious Reel years ago. She takes oral NSAIDs for this problem never on any DMARD treatment.  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  In the past she took Vioxx for up to a few months at a time but has been off this for many years.  More recently she is having increased trouble after the birth of her children now age 63 and 35 years old.  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  She notices some instability and trouble at her ankles this has improved somewhat since starting Celebrex and stretching.  She does not notice a large amount of lower extremity swelling.  She does feel she describes as a hot sensation throughout her whole right arm periodically with no visible change.  Nonsteroidal anti-inflammatory medicines do provide some benefit.  She has never had any previous surgery or local injection treatment.   Labs reviewed 01/2021 ANA neg RF neg CRP neg 14-3-3 neg ESR 63 hsCRP 16.370  Activities of Daily Living:  Patient reports morning stiffness for all day. Patient Reports nocturnal pain.  Difficulty dressing/grooming: Denies Difficulty climbing stairs: Reports Difficulty getting out of chair: Reports Difficulty using hands for taps, buttons, cutlery, and/or writing: Reports  Review of Systems   Constitutional:  Negative for fatigue.  HENT:  Negative for mouth sores, mouth dryness and nose dryness.   Eyes:  Negative for pain, itching and dryness.  Respiratory:  Negative for shortness of breath and difficulty breathing.   Cardiovascular:  Negative for chest pain and palpitations.  Gastrointestinal:  Negative for blood in stool, constipation and diarrhea.  Endocrine: Negative for increased urination.  Genitourinary:  Negative for difficulty urinating.  Musculoskeletal:  Positive for joint pain, joint pain, joint swelling and morning stiffness. Negative for myalgias, muscle tenderness and myalgias.  Skin:  Negative for color change, rash and redness.  Allergic/Immunologic: Negative for susceptible to infections.  Neurological:  Positive for numbness. Negative for dizziness, headaches, memory loss and weakness.  Hematological:  Negative for bruising/bleeding tendency.  Psychiatric/Behavioral:  Negative for confusion.    PMFS History:  Patient Active Problem List   Diagnosis Date Noted   Pain in right ankle and joints of right foot 03/29/2021   Patellofemoral syndrome of right knee 03/29/2021   Sedimentation rate elevation 03/29/2021   Pain in right hip 03/29/2021   Major depression, recurrent ([place]) 09/16/2019   Hyperlipidemia LDL goal <70    Kienboeck disease of adults    Diabetes mellitus type 2, uncontrolled ([place]) 06/22/2014   PCOS (polycystic ovarian syndrome) 04/15/2012   OTHER SPECIFIED FORMS OF HEARING LOSS 03/10/2009    Past Medical History:  Diagnosis Date   Diabetes mellitus type 2, uncontrolled, without complications [id number]/6606  History of polycystic ovaries    Hyperlipidemia LDL goal <70 06/2014   Kienbock's avascular necrosis of lunate, adult    Seronegative arthritis    possible, [facility], [place]    Family History  Problem Relation Age of Onset   Stroke Mother        CVA (TIA)   Diabetes Mother    Healthy Sister    Healthy Daughter    Diabetes Maternal  Grandmother    Hypertension Maternal Grandmother    Diabetes Paternal Grandmother    Hypertension Paternal Grandmother    Healthy Brother    Healthy Brother    Healthy Son    Breast cancer Neg Hx    Past Surgical History:  Procedure Laterality Date   CESAREAN SECTION N/A 08/31/2012   Procedure: CESAREAN SECTION;  Surgeon: Mora Bellman, MD;  Location: [facility];  Service: Obstetrics;  Laterality: N/A;   CESAREAN SECTION N/A 03/20/2017   Procedure: REPEAT CESAREAN SECTION;  Surgeon: Rubie Maid, MD;  Location: [facility];  Service: Obstetrics;  Laterality: N/A;  Female born @ 10 Apgars: 8/9 Weight:10lbs 9ozs   Social History   Social History Narrative   Married, Ritson   [place]   No exercise- knee   Professor of Math at [employer]  Administered Date(s) Administered   Influenza Split 06/05/2012   Influenza,inj,Quad PF,6+ Mos 04/26/2017   Influenza-Unspecified 05/10/2020   Moderna Sars-Covid-2 Vaccination 10/02/2019, 10/30/2019   PFIZER(Purple Top)SARS-COV-2 Vaccination 07/28/2020   Pneumococcal Conjugate-13 07/19/2014   Pneumococcal Polysaccharide-23 10/14/2017   Tdap 06/05/2012, 01/17/2017     Objective: Vital Signs: BP (!) 134/94 (BP Location: Left Arm, Patient Position: Sitting, Cuff Size: Large)   Pulse 84   Ht 5' 8" (1.727 m)   Wt 258 lb 12.8 oz (117.4 kg)   LMP 03/21/2021   BMI 39.35 kg/m    Physical Exam Constitutional:      Appearance: She is obese.  HENT:     Right Ear: External ear normal.     Left Ear: External ear normal.     Mouth/Throat:     Mouth: Mucous membranes are moist.     Pharynx: Oropharynx is clear.  Eyes:     Conjunctiva/sclera: Conjunctivae normal.  Cardiovascular:     Rate and Rhythm: Normal rate and regular rhythm.  Pulmonary:     Effort: Pulmonary effort is normal.     Breath sounds: Normal breath sounds.  Musculoskeletal:     Right lower leg: No edema.     Left lower leg: No edema.  Skin:    General: Skin  is warm and dry.     Findings: No rash.  Neurological:     General: No focal deficit present.     Mental Status: She is alert.     Deep Tendon Reflexes: Reflexes normal.  Psychiatric:        Mood and Affect: Mood normal.     Musculoskeletal Exam:  Neck full ROM no tenderness Shoulders full ROM no tenderness or swelling Elbows full ROM no tenderness or swelling Right wrist decreased extension ROM, slightly decreased flexion Mild lumbar paraspinal muscle tenderness to palpation b/l Right hip pain towards groin with internal rotation and FABER maneuver Posterior hip pain with Pace maneuver Right knee patellofemoral crepitus with full ROM, no tenderness or swelling, left knee normal Right greater than left bilateral achilles insertion bony nodules Right ankle eversion ROM slightly decreased compared to left  Investigation: No additional findings.  Imaging: MM 3D SCREEN BREAST  BILATERAL  Result Date: 04/03/2021 CLINICAL DATA:  Screening. EXAM: DIGITAL SCREENING BILATERAL MAMMOGRAM WITH TOMOSYNTHESIS AND CAD TECHNIQUE: Bilateral screening digital craniocaudal and mediolateral oblique mammograms were obtained. Bilateral screening digital breast tomosynthesis was performed. The images were evaluated with computer-aided detection. COMPARISON:  Previous exam(s). ACR Breast Density Category b: There are scattered areas of fibroglandular density. FINDINGS: There are no findings suspicious for malignancy. IMPRESSION: No mammographic evidence of malignancy. A result letter of this screening mammogram will be mailed directly to the patient. RECOMMENDATION: Screening mammogram in one year. (Code:SM-B-01Y) BI-RADS CATEGORY  1: Negative. Electronically Signed   By: Nolon Nations M.D.   On: 04/03/2021 09:24  XR HIP UNILAT W OR W/O PELVIS 2-3 VIEWS RIGHT  Result Date: 03/29/2021 Xray hip b/l and right 3 views SI joints are widely patent bilaterally. Minimal femoroacetabular joint space change but  change with spurring at margin of right acetabular surface. No changes of hip necrosis or demineralization. Impression No inflammatory changes seen, right hip degenerative change could be consistent with femoroacetabular impingement.  XR Ankle 2 Views Right  Result Date: 03/29/2021 Xray right ankle 2 views Normal tibiotalar joint space and alignment. Extensive posterior calcaneal enthesophyte with minimal plantar side. Midfoot joints appear preserved. Impression Large achilles insertion enthesophyte otherwise no significant abnormalities  XR Hand 2 View Right  Result Date: 03/29/2021 Xray right hand 2 views Lunate narrowing consistent with known kienbock's disease and ulnar shortened position there are cystic changes present in carpal bones otherwise normal. MCP, PIP, and DIP joint spaces are preserved. No erosions or abnormal bone mineralization seen. Impression Known right wrist kienbock's disease changes no evidence of erosive arthritis change or demineralization seen  XR KNEE 3 VIEW RIGHT  Result Date: 03/29/2021 Xray right knee 3 views Medial and lateral compartment space preserved with mild osteophyte formation worse on lateral margin. Significant patellofemoral joint space loss with lateral deviation and osteophytosis. No abnormal calcifications seen or obvious significant effusion. Impression Substantial patellofemoral arthritis otherwise preserved knee joint compartments, no obvious inflammatory changes seen   Recent Labs: Lab Results  Component Value Date   WBC 7.4 09/12/2020   HGB 13.1 09/12/2020   PLT 283.0 09/12/2020   NA 132 (L) 09/12/2020   K 4.5 09/12/2020   CL 100 09/12/2020   CO2 22 09/12/2020   GLUCOSE 269 (H) 09/12/2020   BUN 12 09/12/2020   CREATININE 0.64 09/12/2020   BILITOT 0.4 09/12/2020   ALKPHOS 120 (H) 09/12/2020   AST 28 09/12/2020   ALT 144 (H) 09/12/2020   PROT 8.1 09/12/2020   ALBUMIN 4.3 09/12/2020   CALCIUM 9.8 09/12/2020   GFRAA 136 03/28/2017     Speciality Comments: No specialty comments available.  Procedures:  No procedures performed Allergies: Vicodin [hydrocodone-acetaminophen]   Assessment / Plan:     Visit Diagnoses: Sedimentation rate elevation   Elevated inflammatory markers although no specific clinical findings on exam today.  Could be between episodes or could have had significant response to starting NSAID treatment.  She had x-ray of multiple joints including pelvis screening for sacroiliitis also bilateral hands and ankles for any evidence of erosive disease.  Kienboeck disease of adults   Decreased right wrist ROM and pain no recent imaging will obtain updated xray there is risk for secondary OA development from joint dysfunction.  Pain in right ankle and joints of right foot - Plan: XR Hand 2 View Right, XR Ankle 2 Views Right, CANCELED: XR Ankle 2 Views Right  Large right achilles insertion  nodule, checking xray of ankle for any evidence of erosive disease changes.  Chronic pain of right knee Patellofemoral syndrome of right knee  Right knee pain seems most characteristic for patellofemoral pain syndrome at this time.  Pain in right hip   Pain most provoked with internal rotation in a flexed position question for OA vs labrum injury vs impingement, does seem to be a structural issue, checking xrays today.  Orders: Orders Placed This Encounter  Procedures   XR Hand 2 View Right   XR Ankle 2 Views Right   XR HIP UNILAT W OR W/O PELVIS 2-3 VIEWS RIGHT   XR KNEE 3 VIEW RIGHT    No orders of the defined types were placed in this encounter.    Follow-Up Instructions: No follow-ups on file.   Collier Salina, MD  Note - This record has been created using [employer].  Chart creation errors have been sought, but may not always  have been located. Such creation errors do not reflect on  the standard of medical care.   Review of xrays shows large posterior tibial tendon insertion  enthesophyte but no other evidence of inflammation in heel. Wrist demonstrates known lunate disease but otherwise fairly normal. Hip is very suspicious for pincer type femoroacetabular impingement as a cause of symptoms due to spurring at lateral border of acetabulum.

## 2021-03-31 ENCOUNTER — Ambulatory Visit
Admission: RE | Admit: 2021-03-31 | Discharge: 2021-03-31 | Disposition: A | Discharge status: Home / Self Care | Payer: BC Managed Care – PPO | Source: Ambulatory Visit | Attending: Family Medicine | Admitting: Family Medicine

## 2021-03-31 ENCOUNTER — Other Ambulatory Visit: Payer: Self-pay

## 2021-03-31 DIAGNOSIS — Z1231 Encounter for screening mammogram for malignant neoplasm of breast: Secondary | ICD-10-CM

## 2021-03-31 NOTE — Progress Notes (Signed)
I spoke with Priscilla King about her findings. Please contact her to schedule a follow up whenever is convenient for her.Marland Kitchen

## 2021-04-03 ENCOUNTER — Telehealth: Payer: Self-pay | Admitting: Internal Medicine

## 2021-04-03 NOTE — Telephone Encounter (Signed)
Patient has been scheduled for a rov on 04/19/21. Patient would like to know if she could do a virtual visit instead of in person visit? Also, if okay to do virtual, would you like appointment to be moved to a 1:00 pm or 3:00 pm appointment time? Please advise.

## 2021-04-03 NOTE — Telephone Encounter (Signed)
Virtual visit should be okay since we are discussing treatment options based on initial visit findings. Yes I would like it moved to the beginning or end of the schedule if converting to virtual.

## 2021-04-03 NOTE — Telephone Encounter (Signed)
Spoke with patient, visit has been changed to virtual per Dr. Marveen Reeks approval. Patient is scheduled 04/19/2021 at 11:20 am.

## 2021-04-06 NOTE — Progress Notes (Deleted)
GYNECOLOGY ANNUAL PHYSICAL EXAM PROGRESS NOTE  Subjective:   Priscilla King is a 45 y.o. G16P2012 female who presents for an annual exam. The patient has no complaints today. The patient is sexually active. The patient wears seatbelts: yes. The patient participates in regular exercise: yes (walking). Has the patient ever been transfused or tattooed?: no.     Gynecologic History:  Menarche age: 62 Patient's last menstrual period was 03/21/2021. Contraception: vasectomy History of STI's: Denies Last Pap: 08/2016. Results were: normal.  Denies h/o abnormal pap smears. Last mammogram: 03/31/2021. Results were: normal       OB History  Gravida Para Term Preterm AB Living  '3 2 2 '$ 0 1 2  SAB IAB Ectopic Multiple Live Births  0 1 0 0 2    # Outcome Date GA Lbr Len/2nd Weight Sex Delivery Anes PTL Lv  3 Term 03/20/17 [redacted]w[redacted]d 10 lb 9 oz (4.79 kg) F CS-Vac Spinal  LIV     Name: Knodel,[name]     Apgar1: 8  Apgar5: 9  2 Term 08/31/12 358w1d8 lb 12.4 oz (3.98 kg) M CS-LVertical EPI  LIV     Name: Cloyd,BOY Kyiesha     Apgar1: 8  Apgar5: 9  1 IAB 1998    U    DEC     Birth Comments: Elective AB    Past Medical History:  Diagnosis Date   Diabetes mellitus type 2, uncontrolled, without complications [id number] History of polycystic ovaries    Hyperlipidemia LDL goal <70 06/2014   Kienbock's avascular necrosis of lunate, adult    Seronegative arthritis    [place], [place]  Past Surgical History:  Procedure Laterality Date   CESAREAN SECTION N/A 08/31/2012   Procedure: CESAREAN SECTION;  Surgeon: PeMora BellmanMD;  [facility];  Service: Obstetrics;  Laterality: N/A;   CESAREAN SECTION N/A 03/20/2017   Procedure: REPEAT CESAREAN SECTION;  Surgeon: ChRubie MaidMD;  Location: [facility];  Service: Obstetrics;  Laterality: N/A;  Female born @ 1523pgars: 8/9 Weight:10lbs 9ozs    Family History  Problem Relation Age of Onset   Stroke Mother        CVA (TIA)    Diabetes Mother    Healthy Sister    Healthy Daughter    Diabetes Maternal Grandmother    Hypertension Maternal Grandmother    Diabetes Paternal Grandmother    Hypertension Paternal Grandmother    Healthy Brother    Healthy Brother    Healthy Son    Breast cancer Neg Hx     Social History   Socioeconomic History   Marital status: Married    Spouse name: Not on file   Number of children: 1   Years of education: Not on file   Highest education level: Not on file  Occupational History   Occupation: Professor    Employer: [employer]  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    Employer: [employer]  Employer: [employer]  Tobacco Use   Smoking status: Never   Smokeless tobacco: Never  Vaping Use   Vaping Use: Never used  Substance and Sexual Activity   Alcohol use: No   Drug use: No   Sexual activity: Yes    Birth control/protection: None    Comment: IUD removed in Oct 2017  Other Topics Concern   Not on file  Social History Narrative   Married, R[place]on   [place] No  exercise- knee   Professor of Math at [employer] Strain: Not on [employer] Insecurity: Not on file  Transportation Needs: Not on file  Physical Activity: Not on file  Stress: Not on file  Social Connections: Not on file  Intimate Partner Violence: Not on file    Current Outpatient Medications on File Prior to Visit  Medication Sig Dispense Refill   amitriptyline (ELAVIL) 10 MG tablet TAKE 1-2 TABLETS (10-20 MG TOTAL) BY MOUTH AT BEDTIME. 180 tablet 0   celecoxib (CELEBREX) 200 MG capsule Take 1 capsule (200 mg total) by mouth daily. 30 capsule 2   diclofenac sodium (VOLTAREN) 1 % GEL Apply 2 g topically 4 (four) times daily. 3 Tube 5   glyBURIDE (DIABETA) 5 MG tablet Take 2 tablets (10 mg total) by mouth 2 (two) times daily with a meal. 360 tablet 0   No current facility-administered medications on file prior to visit.    Allergies  Allergen Reactions    Vicodin [Hydrocodone-Acetaminophen] Swelling    Tongue swelling and headache     Review of Systems Constitutional: negative for chills, fatigue, fevers and sweats Eyes: negative for irritation, redness and visual disturbance Ears, nose, mouth, throat, and face: negative for hearing loss, nasal congestion, snoring and tinnitus Respiratory: negative for asthma, cough, sputum Cardiovascular: negative for chest pain, dyspnea, exertional chest pressure/discomfort, irregular heart beat, palpitations and syncope Gastrointestinal: negative for abdominal pain, change in bowel habits, nausea and vomiting Genitourinary: negative for abnormal menstrual periods, genital lesions, sexual problems and vaginal discharge, dysuria and urinary incontinence Integument/breast: negative for breast lump, breast tenderness and nipple discharge Hematologic/lymphatic: negative for bleeding and easy bruising Musculoskeletal:negative for back pain and muscle weakness Neurological: negative for dizziness, headaches, vertigo and weakness Endocrine: negative for diabetic symptoms including polydipsia, polyuria and skin dryness Allergic/Immunologic: negative for hay fever and urticaria      Objective:  Last menstrual period 03/21/2021. There is no height or weight on file to calculate BMI.    General Appearance:    Alert, cooperative, no distress, appears stated age  Head:    Normocephalic, without obvious abnormality, atraumatic  Eyes:    PERRL, conjunctiva/corneas clear, EOM's intact, both eyes  Ears:    Normal external ear canals, both ears  Nose:   Nares normal, septum midline, mucosa normal, no drainage or sinus tenderness  Throat:   Lips, mucosa, and tongue normal; teeth and gums normal  Neck:   Supple, symmetrical, trachea midline, no adenopathy; thyroid: no enlargement/tenderness/nodules; no carotid bruit or JVD  Back:     Symmetric, no curvature, ROM normal, no CVA tenderness  Lungs:     Clear to  auscultation bilaterally, respirations unlabored  Chest Wall:    No tenderness or deformity   Heart:    Regular rate and rhythm, S1 and S2 normal, no murmur, rub or gallop  Breast Exam:    No tenderness, masses, or nipple abnormality  Abdomen:     Soft, non-tender, bowel sounds active all four quadrants, no masses, no organomegaly.    Genitalia:    Pelvic:external genitalia normal, vagina without lesions, discharge, or tenderness, rectovaginal septum  normal. Cervix normal in appearance, no cervical motion tenderness, no adnexal masses or tenderness.  Uterus normal size, shape, mobile, regular contours, nontender.  Rectal:    Normal external sphincter.  No hemorrhoids appreciated. Internal exam not done.   Extremities:   Extremities normal, atraumatic, no cyanosis or edema  Pulses:   2+ and symmetric all extremities  Skin:   Skin color, texture, turgor normal, no rashes or lesions  Lymph nodes:   Cervical, supraclavicular, and axillary nodes normal  Neurologic:   CNII-XII intact, normal strength, sensation and reflexes throughout   .  Labs:  Lab Results  Component Value Date   WBC 7.4 09/12/2020   HGB 13.1 09/12/2020   HCT 40.5 09/12/2020   MCV 80.1 09/12/2020   PLT 283.0 09/12/2020    Lab Results  Component Value Date   CREATININE 0.64 09/12/2020   BUN 12 09/12/2020   NA 132 (L) 09/12/2020   K 4.5 09/12/2020   CL 100 09/12/2020   CO2 22 09/12/2020    Lab Results  Component Value Date   ALT 144 (H) 09/12/2020   AST 28 09/12/2020   ALKPHOS 120 (H) 09/12/2020   BILITOT 0.4 09/12/2020    Lab Results  Component Value Date   TSH 1.89 08/12/2019     Assessment:   No diagnosis found.   Plan:  Blood tests: {blood tests:13147}. Breast self exam technique reviewed and patient encouraged to perform self-exam monthly. Contraception: vasectomy. Discussed healthy lifestyle modifications. Mammogram  UTD Pap smear  UTD . COVID vaccination status: Follow up in 1 year for  annual exam  Rubie Maid, MD [facility]

## 2021-04-07 ENCOUNTER — Encounter: Payer: BC Managed Care – PPO | Admitting: Obstetrics and Gynecology

## 2021-04-18 NOTE — Progress Notes (Addendum)
Telemedicine Visit Note  Patient: Priscilla King             Date of Birth: 07/18/1976           MRN: [id number]             PCP: Owens Loffler, MD Referring: Owens Loffler, MD Visit Date: 04/19/2021   Subjective:   History of Present Illness: Priscilla King is a 45 y.o. female here for follow up with joint pain at multiple sites initial visit workup negative for any specific inflammatory changes on examination. Imaging was consistent with known kienbock's disease of the right wrist, extensive right heel enthesophytes, and probably femoroacetabular impingement of the right hip. So far celebrex has been partially helpful. She is curious about treatment options since she has concern about taking increased antiinflammatory medication doses.  [name] is located at her home office in [place], [place] during this visit. I was present from office at [place], [place]  Previous HPI 03/29/21 Priscilla King is a 45 y.o. female here for possible seronegative inflammatory arthritis with pain at multiple sites. She previously saw Dr. Precious Reel years ago. She takes oral NSAIDs for this problem never on any DMARD treatment.  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  In the past she took Vioxx for up to a few months at a time but has been off this for many years.  More recently she is having increased trouble after the birth of her children now age 63 and 28 years old.  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  She notices some instability and trouble at her ankles this has improved somewhat since starting Celebrex and stretching.  She does not notice a large amount of lower extremity swelling.  She does feel she describes as a hot sensation  throughout her whole right arm periodically with no visible change.  Nonsteroidal anti-inflammatory medicines do provide some benefit.  She has never had any previous surgery or local injection treatment.     Labs reviewed 01/2021 ANA neg RF neg CRP neg 14-3-3 neg ESR 63 hsCRP 16.370  Review of Systems  Constitutional:  Positive for fatigue.  HENT:  Negative for mouth dryness.   Eyes:  Negative for dryness.  Respiratory:  Negative for shortness of breath.   Cardiovascular:  Positive for swelling in legs/feet.  Gastrointestinal:  Negative for constipation.  Endocrine: Negative for excessive thirst.  Genitourinary:  Negative for difficulty urinating.  Musculoskeletal:  Positive for joint pain, joint pain, joint swelling, muscle weakness, morning stiffness and muscle tenderness.  Skin:  Negative for rash.  Allergic/Immunologic: Negative for susceptible to infections.  Neurological:  Positive for numbness and weakness.  Hematological:  Negative for bruising/bleeding tendency.  Psychiatric/Behavioral:  Negative for sleep disturbance.    PMFS History:  Patient Active Problem List   Diagnosis Date Noted   Femoroacetabular impingement of right hip 03/29/2021   Major depression, recurrent ([place]) 09/16/2019   Hyperlipidemia LDL goal <70    Kienboeck disease of adults    Uncontrolled type 2 diabetes mellitus with hyperglycemia, without long-term current use of insulin ([place]) 06/22/2014   PCOS (polycystic ovarian syndrome) 04/15/2012   OTHER SPECIFIED FORMS OF HEARING LOSS 03/10/2009    Past  Medical History:  Diagnosis Date   Diabetes mellitus type 2, uncontrolled, without complications 55/7322   History of polycystic ovaries    Hyperlipidemia LDL goal <70 06/2014   Kienbock's avascular necrosis of lunate, adult    Seronegative arthritis    possible, [place], [place]    Family History  Problem Relation Age of Onset   Stroke Mother        CVA (TIA)   Diabetes Mother    Healthy Sister     Healthy Daughter    Diabetes Maternal Grandmother    Hypertension Maternal Grandmother    Diabetes Paternal Grandmother    Hypertension Paternal Grandmother    Healthy Brother    Healthy Brother    Healthy Son    Breast cancer Neg Hx    Past Surgical History:  Procedure Laterality Date   CESAREAN SECTION N/A 08/31/2012   Procedure: CESAREAN SECTION;  Surgeon: Mora Bellman, MD;  Location: [facility];  Service: Obstetrics;  Laterality: N/A;   CESAREAN SECTION N/A 03/20/2017   Procedure: REPEAT CESAREAN SECTION;  Surgeon: Rubie Maid, MD;  Location: [facility];  Service: Obstetrics;  Laterality: N/A;  Female born @ 70 Apgars: 8/9 Weight:10lbs 9ozs   Social History   Social History Narrative   Married, Ritson   [place]   No exercise- knee   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  Neurological:     Mental Status: She is alert.  Psychiatric:        Mood and Affect: Mood normal.      Investigation: No additional findings.  Imaging: No results found.  Recent Labs: Lab Results  Component Value Date   WBC 7.4 09/12/2020   HGB 13.1 09/12/2020   PLT 283.0 09/12/2020   NA 132 (L) 09/12/2020   K 4.5 09/12/2020   CL 100 09/12/2020   CO2 22 09/12/2020   GLUCOSE 269 (H) 09/12/2020   BUN 12 09/12/2020   CREATININE 0.64 09/12/2020   BILITOT 0.4 09/12/2020   ALKPHOS 120 (H) 09/12/2020   AST 28 09/12/2020   ALT 144 (H) 09/12/2020   PROT 8.1 09/12/2020    ALBUMIN 4.3 09/12/2020   CALCIUM 9.8 09/12/2020   GFRAA 136 03/28/2017    Speciality Comments: No specialty comments available.  Procedures:  No procedures performed Allergies: Vicodin [hydrocodone-acetaminophen]   Assessment / Plan:     Visit Diagnoses: Femoroacetabular impingement of right hip - Plan: Ambulatory referral to Physical Therapy  Symptoms and findings most consistent for femoroacetabular impingement of the right hip. Xray images were individually reviewed using screen sharing. We discussed treatment options including NSAIDs, PT, local steroid injection, or surgical revision. She is interested in trying to see PT about this issue as a more conservative approach for now. Also continuing low dose celebrex 200 mg daily as needed for now. If symptoms fail to improve within a few weeks would consider local steroid injection to the hip, probably in ortho clinic setting for next step.  Kienboeck disease of adults  Right  wrist abnormality looks typical for this process, no significant erosion or displacement of other carpal bones so I do not think any kind of hand surgery currently indicated.  A total of 20 minutes were spent during this encounter reviewing the imaging findings individually and diagnosis, as well as discussion of treatment options and shared decision making.  Orders: Orders Placed This Encounter  Procedures   Ambulatory referral to Physical Therapy    No orders of the defined types were placed in this encounter.    Follow-Up Instructions: No follow-ups on file.   Collier Salina, MD  Note - This record has been created using [employer].  Chart creation errors have been sought, but may not always  have been located. Such creation errors do not reflect on  the standard of medical care.   Addendum 10/03/21 This visit was conducted entirely by video communication.

## 2021-04-19 ENCOUNTER — Encounter: Payer: Self-pay | Admitting: Internal Medicine

## 2021-04-19 ENCOUNTER — Other Ambulatory Visit: Payer: Self-pay

## 2021-04-19 ENCOUNTER — Ambulatory Visit: Payer: BC Managed Care – PPO | Admitting: Internal Medicine

## 2021-04-19 ENCOUNTER — Telehealth: Payer: BC Managed Care – PPO | Admitting: Internal Medicine

## 2021-04-19 VITALS — Ht 68.0 in

## 2021-04-19 DIAGNOSIS — M25851 Other specified joint disorders, right hip: Secondary | ICD-10-CM

## 2021-04-19 DIAGNOSIS — R700 Elevated erythrocyte sedimentation rate: Secondary | ICD-10-CM

## 2021-04-19 DIAGNOSIS — M931 Kienbock's disease of adults: Secondary | ICD-10-CM

## 2021-04-26 ENCOUNTER — Encounter: Payer: Self-pay | Admitting: Physical Therapy

## 2021-04-26 ENCOUNTER — Ambulatory Visit: Payer: BC Managed Care – PPO | Attending: Internal Medicine | Admitting: Physical Therapy

## 2021-04-26 ENCOUNTER — Encounter: Payer: Self-pay | Admitting: Internal Medicine

## 2021-04-26 ENCOUNTER — Encounter: Payer: BC Managed Care – PPO | Admitting: Obstetrics and Gynecology

## 2021-04-26 ENCOUNTER — Other Ambulatory Visit: Payer: Self-pay

## 2021-04-26 VITALS — BP 130/86 | HR 93

## 2021-04-26 DIAGNOSIS — M25551 Pain in right hip: Secondary | ICD-10-CM | POA: Diagnosis not present

## 2021-04-26 NOTE — Therapy (Signed)
[facility] [address] [place], [address] Phone: [phone]   Fax:  [phone]  Physical Therapy Evaluation  Patient Details  Name: Priscilla King MRN: [id number] Date of Birth: [birth year] Referring Provider (PT): Vernelle Emerald   Encounter Date: 04/26/2021    Past Medical History:  Diagnosis Date   Diabetes mellitus type 2, uncontrolled, without complications 16/9678   History of polycystic ovaries    Hyperlipidemia LDL goal <70 06/2014   Kienbock's avascular necrosis of lunate, adult    Seronegative arthritis    possible, [place], [place]    Past Surgical History:  Procedure Laterality Date   CESAREAN SECTION N/A 08/31/2012   Procedure: CESAREAN SECTION;  Surgeon: Mora Bellman, MD;  Location: [facility];  Service: Obstetrics;  Laterality: N/A;   CESAREAN SECTION N/A 03/20/2017   Procedure: REPEAT CESAREAN SECTION;  Surgeon: Rubie Maid, MD;  Location: [facility];  Service: Obstetrics;  Laterality: N/A;  Female born @ [birth year] Apgars: 8/9 Weight:10lbs 9ozs    Vitals:   04/26/21 0811  BP: 130/86  Pulse: 93  SpO2: 91%      Subjective Assessment - 04/26/21 0811     Subjective Pt reports that she started right sided hip pain a couple of months back, and that she noticed that the pain typically occurs on the right side of her body. She has bone spurring in her right ankle. Celebrex was helping some, but right hip pain is still noticeable. Her right hip pain worsens over the day to the point where she has to lay down after work. Pt especially feels it when going up stairs.    Pertinent History Previous HPI  03/29/21  Priscilla King is a 45 y.o. female here for possible seronegative inflammatory arthritis with pain at multiple sites. She previously saw Dr. Precious Reel years ago. She takes oral NSAIDs for this problem never on any DMARD treatment.  Symptoms are longstanding since many years ago previous work-up  negative for any seropositivity or erosive disease she does have Kienbock disease of the right wrist without severe joint dysfunction.  In the past she took Vioxx for up to a few months at a time but has been off this for many years.  More recently she is having increased trouble after the birth of her children now age 60 and 6 years old.  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  She notices some instability and trouble at her ankles this has improved somewhat since starting Celebrex and stretching.  She does not notice a large amount of lower extremity swelling.  She does feel she describes as a hot sensation throughout her whole right arm periodically with no visible change.  Nonsteroidal anti-inflammatory medicines do provide some benefit.  She has never had any previous surgery or local injection treatment.    Currently in Pain? Yes    Pain Score 8     Pain Location Hip    Pain Orientation Right    Pain Descriptors / Indicators Sharp;Burning    Pain Type Chronic pain    Pain Onset More than a month ago    Pain Frequency Intermittent    Aggravating Factors  Standing going up stairs    Pain Relieving Factors Celebrex    Effect of Pain on Daily Activities Able to do all activities, but feels pain while doing them.  [facility] PT Assessment - 04/26/21 0001       Assessment   Medical Diagnosis right hip pain    Referring Provider (PT) Vernelle Emerald    Onset Date/Surgical Date 03/29/21    Next MD Visit 05/27/21    Prior Therapy Yes      [place] residence    Living Arrangements Spouse/significant other;Children    Type of Orrum to enter    Entrance Stairs-Number of Steps 2            Hip Evaluation    - Cauda Equina Syndrome: Negative to All  Bladder/bowel dysfunction Saddle anesthesia  Sexual  dysfunction Possible neurological deficits in the lower limb (motor or sensory loss, reflex change)   OBJECTIVE:   Observation: Gross symmetry: Posture: Gait: Symmetrical step length  Stairs: NT  Palpation    Hip AROM R/L:            NORMS: Flexion: 120 120            120 deg Extension: 20 20           20  deg Abd:   NT               40 deg ER:              45  45         45 deg IR:             45   45          45 deg   Hip Strength:        R         L Hip flexion:           5/5       5/5 Hip abduction:      5/5       5/5 Hip adduction:      4/5       4/5 Hip extension:      4/5       4/5 Hip ER:             5/5       5/5 Hip IR:             5/5       5/5  Knee Flexion Strength 4+/5    4+/5   Special Tests:  - Rule out SIJ  Thigh thrust              (-)  - Rule out Lumbar Spine Slump Test                (+/-) SLR                           (+/-)    - Muscle Length Testing: Marcello Moores test             (+ B) Ober's test               (-) Ely's Test                 (-)  - Intra-articular pathology: FABER test                (-)  - Impingement/Labral tear: FADDIR test               (+ R, -L) Flexion-IR  test           (+ R, -L)  Functional Tests: Squat   45 deg with no pain  Single Leg Stance Sit to stand- performed with no pain   THEREX:  Modified Thomas Stretch 2 x 60 sec  Single Leg Bridge 2 x 10  Stand HS curls with BUE support 2 x 10    PT Education - 04/26/21 1039     Education Details form/technique with exercise    Person(s) Educated Patient    Methods Explanation;Demonstration;Handout;Verbal cues    Comprehension Verbalized understanding;Returned demonstration;Verbal cues required              PT Short Term Goals - 04/26/21 0907       PT SHORT TERM GOAL #1   Title Patient will demonstrate understanding of HEP to treat deficits independently.    Baseline 04/26/21    Time 2    Period Weeks    Status Achieved    Target Date 04/26/21                PT Long Term Goals - 04/26/21 1036       PT LONG TERM GOAL #1   Title Patient will have improved function and activity level as evidenced by an increase in FOTO score by 10 points or more.    Baseline 10/5: 26/55    Status Deferred                    Plan - 04/26/21 1040     Clinical Impression Statement Pt is a 45 yo AA female that presents for right sided hip pain 2/2 seronegative RA. She exhibits little to no strength, ROM, and flexibility deficits, and she will likely not benefit from PT given that pain is likely result of RA exacerbation and will not respond to PT intervention. She has been given HEP to address minor deficit with hip flexor flexibility and hip extension and hamstring strength.    Personal Factors and Comorbidities Past/Current Experience;Time since onset of injury/illness/exacerbation;Comorbidity 2    Comorbidities T2DM, Seronegative arthritis    Examination-Activity Limitations Stairs;Stand    Examination-Participation Restrictions Occupation    Stability/Clinical Decision Making Stable/Uncomplicated    Clinical Decision Making Low    Rehab Potential Poor    PT Next Visit Plan N/a    PT Home Exercise Plan [email or web address]    Consulted and Agree with Plan of Care Patient            Access Code: [email or web address] URL: [email or web address] Date: 04/26/2021 Prepared by: Bradly Chris  Exercises Supine Bridge with Knee Extension and Pelvic Floor Contraction - 1 x daily - 3 x weekly - 3 sets - 10 reps Modified Thomas Stretch - 1 x daily - 7 x weekly - 1 sets - 5 reps - 30 hold Standing Alternating Knee Flexion - 1 x daily - 3 x weekly - 3 sets - 10 reps   Patient will benefit from skilled therapeutic intervention in order to improve the following deficits and impairments:  Decreased strength, Pain  Visit Diagnosis: Pain in right hip  Problem List Patient Active Problem List   Diagnosis Date Noted   Pain in right ankle and  joints of right foot 03/29/2021   Patellofemoral syndrome of right knee 03/29/2021   Sedimentation rate elevation 03/29/2021   Femoroacetabular impingement of right hip 03/29/2021   Major depression, recurrent ([place]) 09/16/2019   Hyperlipidemia LDL goal <70    Kienboeck disease of  adults    Diabetes mellitus type 2, uncontrolled 06/22/2014   PCOS (polycystic ovarian syndrome) 04/15/2012   OTHER SPECIFIED FORMS OF HEARING LOSS 03/10/2009   Bradly Chris PT, DPT  04/26/2021, 10:45 AM  [facility] [address] [place], [address] Phone: [phone]   Fax:  [phone]  Name: Priscilla King MRN: [id number] Date of Birth: 1975/09/22

## 2021-05-03 ENCOUNTER — Encounter: Payer: BC Managed Care – PPO | Admitting: Physical Therapy

## 2021-05-08 ENCOUNTER — Encounter: Payer: BC Managed Care – PPO | Admitting: Physical Therapy

## 2021-05-09 ENCOUNTER — Other Ambulatory Visit: Payer: Self-pay | Admitting: Family Medicine

## 2021-05-10 ENCOUNTER — Encounter: Payer: Self-pay | Admitting: Family Medicine

## 2021-05-10 ENCOUNTER — Other Ambulatory Visit: Payer: Self-pay

## 2021-05-10 ENCOUNTER — Encounter: Payer: BC Managed Care – PPO | Admitting: Physical Therapy

## 2021-05-10 ENCOUNTER — Telehealth (INDEPENDENT_AMBULATORY_CARE_PROVIDER_SITE_OTHER): Payer: BC Managed Care – PPO | Admitting: Family Medicine

## 2021-05-10 VITALS — Ht 66.0 in | Wt 246.0 lb

## 2021-05-10 DIAGNOSIS — E669 Obesity, unspecified: Secondary | ICD-10-CM

## 2021-05-10 DIAGNOSIS — E1165 Type 2 diabetes mellitus with hyperglycemia: Secondary | ICD-10-CM

## 2021-05-10 DIAGNOSIS — M25551 Pain in right hip: Secondary | ICD-10-CM | POA: Diagnosis not present

## 2021-05-10 DIAGNOSIS — G8929 Other chronic pain: Secondary | ICD-10-CM

## 2021-05-10 DIAGNOSIS — M2550 Pain in unspecified joint: Secondary | ICD-10-CM

## 2021-05-10 MED ORDER — TRULICITY 4.5 MG/0.5ML ~~LOC~~ SOAJ: 4.5000 mg | SUBCUTANEOUS | 3 refills | Status: DC

## 2021-05-10 MED ORDER — CELECOXIB 200 MG PO CAPS: 200.0000 mg | ORAL_CAPSULE | Freq: Two times a day (BID) | ORAL | 3 refills | Status: DC

## 2021-05-10 MED ORDER — TRULICITY 0.75 MG/0.5ML ~~LOC~~ SOAJ: SUBCUTANEOUS | 0 refills | Status: DC

## 2021-05-10 NOTE — Progress Notes (Signed)
Priscilla Roma T. Kaeo Jacome, MD Primary Care and [place] at [facility] [place], [address] Phone: [phone]  FAX: [phone]  Priscilla King - 45 y.o. female  MRN [id number]  Date of Birth: [birth year]  Visit Date: 05/10/2021  PCP: Owens Loffler, MD  Referred by: Owens Loffler, MD  Virtual Visit via Video Note:  I connected with  [name] on 05/10/2021  2:00 PM EDT by a video enabled telemedicine application and verified that I am speaking with the correct person using two identifiers.   Location patient: home computer, tablet, or smartphone Location provider: work or home office Consent: Verbal consent directly obtained from Priscilla King. Persons participating in the virtual visit: patient, provider  I discussed the limitations of evaluation and management by telemedicine and the availability of in person appointments. The patient expressed understanding and agreed to proceed.  Chief Complaint  Patient presents with   Follow-up    Hip Pain    History of Present Illness:  Diabetes.  She has been unable to tolerate metformin, so we maxed out 1 agent.  It is still not stable at all.  She has been doing a great job monitoring her blood sugars, and she has a lot of data points. Increased her to glyburide, now has  Fastings 287-349, 311 median 252 at lunch Dinned 252, median of 380  No breakfast.  Lab Results  Component Value Date   HGBA1C 10.0 (H) 09/12/2020    Glyburide 10 mg bid  She does have her continued polyarthralgia.  Rheumatology did not feel like she had a systemic rheumatological condition. ? If pain and high blood sugar correlates.   Has had a lot of weight gain and then in the past -lifelong, she had had essentially baseline weight of around 199, and since childbirth this is been elevated approaching 250.  On Elavil 10-20 mg, this does help somewhat at nighttime, but she still  does wake up with pain.  Primary point of pain is her right hip. She wonders how much of her weight and being out of shape is playing a role here.  Review of Systems as above: See pertinent positives and pertinent negatives per HPI No acute distress verbally   Observations/Objective/Exam:  An attempt was made to discern vital signs over the phone and per patient if applicable and possible.   General:    Alert, Oriented, appears well and in no acute distress  Pulmonary:     On inspection no signs of respiratory distress.  Psych / Neurological:     Pleasant and cooperative.  Assessment and Plan:    ICD-10-CM   1. Uncontrolled type 2 diabetes mellitus with hyperglycemia, without long-term current use of insulin (HCC)  E11.65     2. Obesity (BMI 30-39.9)  E66.9     3. Polyarthralgia  M25.50     4. Chronic hip pain, right  M25.551    G89.29      Total encounter time: 30 minutes. This includes total time spent on the day of encounter.  Additional time spent in chart review, discussion with the patient, and trying to help coordinate obtaining Dexcom.  She is maxed out on sulfonylurea, unable to tolerate metformin, and I am going to titrate up her Trulicity dose as much as possible.  She is very Sales promotion account executive, and she will be able to watch her blood sugars while we are doing this closely.  I do  think that there is likely direct correlation between her weight as well as her diffuse joint pain.  I think that it is entirely appropriate to work on weight loss and good blood sugar control prior to doing further interventional work.  Certainly hip injection is easy to do, but this could be done at any time.  I discussed the assessment and treatment plan with the patient. The patient was provided an opportunity to ask questions and all were answered. The patient agreed with the plan and demonstrated an understanding of the instructions.   The patient was advised to call back or seek  an in-person evaluation if the symptoms worsen or if the condition fails to improve as anticipated.  Follow-up: prn unless noted otherwise below No follow-ups on file.  Meds ordered this encounter  Medications   Dulaglutide (TRULICITY) 8.82 CM/0.3KJ SOPN    Sig: 1 injection for first week, then 2 injections for second week, then 3 injections for third week, then 4 injections for fourth week.    Dispense:  5 mL    Refill:  0    10 total pens   Dulaglutide (TRULICITY) 4.5 ZP/9.1TA SOPN    Sig: Inject 4.5 mg as directed once a week.    Dispense:  2 mL    Refill:  3    Refills on higher dose after one month   celecoxib (CELEBREX) 200 MG capsule    Sig: Take 1 capsule (200 mg total) by mouth 2 (two) times daily.    Dispense:  60 capsule    Refill:  3   No orders of the defined types were placed in this encounter.   Signed,  Maud Deed. Muscab Brenneman, MD

## 2021-05-10 NOTE — Telephone Encounter (Signed)
Last office visit 03/01/2021 for polyarthralgia.  Last refilled 02/02/2021 for #30 with 2 refills.  Virtual visit scheduled today to follow up on hip pain at 2:00 pm.

## 2021-05-15 ENCOUNTER — Telehealth: Payer: Self-pay

## 2021-05-15 ENCOUNTER — Encounter: Payer: BC Managed Care – PPO | Admitting: Physical Therapy

## 2021-05-15 DIAGNOSIS — E1165 Type 2 diabetes mellitus with hyperglycemia: Secondary | ICD-10-CM

## 2021-05-15 MED ORDER — FREESTYLE LIBRE 2 SENSOR MISC: 1.0000 "application " | 2 refills | Status: DC

## 2021-05-15 NOTE — Telephone Encounter (Signed)
Called patient to discuss CGM cost options below per Dr. Lillie Fragmin request. Patient would like [place] to send to pharmacy to price out. She will update [place] by MyChart if unaffordable. [facility] [address]   All cash, commercial and Medicaid go through regular pharmacy. Most commercial insurance plans do not have any insulin injection requirements (but sometimes an employer specific plan may require them to have injection requirements). There is no coupon per se but [employer] has automatic discounts set up at [employer] that guarantee the patient never pays more than $75 (this applies to cash and commercial). The only cavate to that is [employer]. They do not allow the automatic discount so [employer] patients need to call [place] at [phone] and they will be given a code to bring the cost down to $75/month (2 sensors) and it will override the prior authorization.    Debbora Dus, PharmD Clinical Pharmacist No Name Primary Care at [facility] [phone]

## 2021-05-17 ENCOUNTER — Encounter: Payer: BC Managed Care – PPO | Admitting: Obstetrics and Gynecology

## 2021-05-17 ENCOUNTER — Encounter: Payer: BC Managed Care – PPO | Admitting: Physical Therapy

## 2021-05-22 ENCOUNTER — Encounter: Payer: BC Managed Care – PPO | Admitting: Physical Therapy

## 2021-05-22 ENCOUNTER — Other Ambulatory Visit: Payer: Self-pay | Admitting: Family Medicine

## 2021-05-22 DIAGNOSIS — F411 Generalized anxiety disorder: Secondary | ICD-10-CM | POA: Diagnosis not present

## 2021-05-22 DIAGNOSIS — F331 Major depressive disorder, recurrent, moderate: Secondary | ICD-10-CM | POA: Diagnosis not present

## 2021-05-24 ENCOUNTER — Encounter: Payer: BC Managed Care – PPO | Admitting: Physical Therapy

## 2021-05-29 ENCOUNTER — Encounter: Payer: BC Managed Care – PPO | Admitting: Physical Therapy

## 2021-05-29 DIAGNOSIS — F331 Major depressive disorder, recurrent, moderate: Secondary | ICD-10-CM | POA: Diagnosis not present

## 2021-05-29 DIAGNOSIS — F411 Generalized anxiety disorder: Secondary | ICD-10-CM | POA: Diagnosis not present

## 2021-05-31 ENCOUNTER — Encounter: Payer: BC Managed Care – PPO | Admitting: Physical Therapy

## 2021-06-05 ENCOUNTER — Telehealth: Payer: BC Managed Care – PPO | Admitting: Family Medicine

## 2021-06-07 ENCOUNTER — Other Ambulatory Visit: Payer: Self-pay

## 2021-06-07 ENCOUNTER — Encounter: Payer: Self-pay | Admitting: Family Medicine

## 2021-06-07 ENCOUNTER — Telehealth (INDEPENDENT_AMBULATORY_CARE_PROVIDER_SITE_OTHER): Payer: BC Managed Care – PPO | Admitting: Family Medicine

## 2021-06-07 VITALS — Ht 66.0 in | Wt 249.0 lb

## 2021-06-07 DIAGNOSIS — E1165 Type 2 diabetes mellitus with hyperglycemia: Secondary | ICD-10-CM | POA: Diagnosis not present

## 2021-06-07 MED ORDER — TRULICITY 1.5 MG/0.5ML ~~LOC~~ SOAJ: 1.5000 mg | SUBCUTANEOUS | 3 refills | Status: DC

## 2021-06-07 NOTE — Progress Notes (Signed)
Priscilla King T. Priscilla Burrough, MD Primary Care and [place] at [facility] [place], [address] Phone: [phone]  FAX: [phone]  Priscilla King - 45 y.o. female  MRN [id number]  Date of Birth: [birth year]  Visit Date: 06/07/2021  PCP: Owens Loffler, MD  Referred by: Owens Loffler, MD  Virtual Visit via Video Note:  I connected with  Priscilla King on 06/07/2021  8:20 AM EST by a video enabled telemedicine application and verified that I am speaking with the correct person using two identifiers.   Location patient: home computer, tablet, or smartphone Location provider: work or home office Consent: Verbal consent directly obtained from [name]. Persons participating in the virtual visit: patient, provider  I discussed the limitations of evaluation and management by telemedicine and the availability of in person appointments. The patient expressed understanding and agreed to proceed.  Chief Complaint  Patient presents with   Follow-up    Trulicity    History of Present Illness:  Diabetes follow-up: I had wanted to titrate her up on Trulicity, and her blood sugars have been severely high for a long time.  At the same time she was taking glyburide, and ultimately her blood sugars were dropping too much.  She did contact me by MyChart and additionally she is very self-aware and discontinued her glyburide.  3 injections of 0.75 injections. BS was going to the  Glyburide is d/c now.   Now on Trulicity at 3 injections -this has been for the last week, and her blood sugars have still been low, and she has been having to chase this with carb intake.  Highest that she had 103. Got as low as 53.    2 injections, everyting was totally fine. Would go up to 141 at the highest point.   1.5 mg now  - 3 injections along with off of glyburide.   Now  overeating.  No metformin.   Glyburide and 3 mg of  Trulicity.   Review of Systems as above: See pertinent positives and pertinent negatives per HPI No acute distress verbally   Observations/Objective/Exam:  An attempt was made to discern vital signs over the phone and per patient if applicable and possible.   General:    Alert, Oriented, appears well and in no acute distress  Pulmonary:     On inspection no signs of respiratory distress.  Psych / Neurological:     Pleasant and cooperative.  Assessment and Plan:    ICD-10-CM   1. Uncontrolled type 2 diabetes mellitus with hyperglycemia, without long-term current use of insulin (HCC)  E11.65      Thankfully, her blood sugars have normalized.  I think that she certainly has been overmedicated some.  I did try to titrate her up fairly aggressively given how poorly her blood sugars were controlled.  At this point I think that continuing with Trulicity only is appropriate.  Convert dosing to 1.5 mg.  I expect that she will also get some weight loss as well.  Follow-up with me in 6 months in the office.  I discussed the assessment and treatment plan with the patient. The patient was provided an opportunity to ask questions and all were answered. The patient agreed with the plan and demonstrated an understanding of the instructions.   The patient was advised to call back or seek an in-person evaluation if the symptoms worsen or if the condition fails to improve as  anticipated.  Meds ordered this encounter  Medications   Dulaglutide (TRULICITY) 1.5 IV/1.2JW SOPN    Sig: Inject 1.5 mg into the skin once a week.    Dispense:  45 mL    Refill:  3    90 day supples of pens   No orders of the defined types were placed in this encounter.   Signed,  Maud Deed. Kamdyn Colborn, MD

## 2021-06-07 NOTE — Addendum Note (Signed)
Addended by: Owens Loffler on: 06/07/2021 10:07 AM   Modules accepted: Orders

## 2021-06-12 ENCOUNTER — Telehealth: Payer: BC Managed Care – PPO | Admitting: Family Medicine

## 2021-06-13 DIAGNOSIS — F331 Major depressive disorder, recurrent, moderate: Secondary | ICD-10-CM | POA: Diagnosis not present

## 2021-06-13 DIAGNOSIS — F411 Generalized anxiety disorder: Secondary | ICD-10-CM | POA: Diagnosis not present

## 2021-06-19 ENCOUNTER — Encounter: Payer: Self-pay | Admitting: Family Medicine

## 2021-06-21 ENCOUNTER — Telehealth: Payer: BC Managed Care – PPO | Admitting: Family Medicine

## 2021-06-21 DIAGNOSIS — F411 Generalized anxiety disorder: Secondary | ICD-10-CM | POA: Diagnosis not present

## 2021-06-21 DIAGNOSIS — F331 Major depressive disorder, recurrent, moderate: Secondary | ICD-10-CM | POA: Diagnosis not present

## 2021-06-22 ENCOUNTER — Encounter: Payer: Self-pay | Admitting: Family Medicine

## 2021-06-22 DIAGNOSIS — M159 Polyosteoarthritis, unspecified: Secondary | ICD-10-CM | POA: Insufficient documentation

## 2021-06-22 NOTE — Telephone Encounter (Signed)
letter

## 2021-06-22 NOTE — Telephone Encounter (Signed)
All done

## 2021-06-26 DIAGNOSIS — F331 Major depressive disorder, recurrent, moderate: Secondary | ICD-10-CM | POA: Diagnosis not present

## 2021-06-26 DIAGNOSIS — F411 Generalized anxiety disorder: Secondary | ICD-10-CM | POA: Diagnosis not present

## 2021-06-28 DIAGNOSIS — M1711 Unilateral primary osteoarthritis, right knee: Secondary | ICD-10-CM | POA: Diagnosis not present

## 2021-06-28 DIAGNOSIS — E1169 Type 2 diabetes mellitus with other specified complication: Secondary | ICD-10-CM | POA: Diagnosis not present

## 2021-06-28 DIAGNOSIS — R7401 Elevation of levels of liver transaminase levels: Secondary | ICD-10-CM | POA: Diagnosis not present

## 2021-07-03 ENCOUNTER — Other Ambulatory Visit: Payer: Self-pay | Admitting: General Surgery

## 2021-07-03 ENCOUNTER — Other Ambulatory Visit (HOSPITAL_COMMUNITY): Payer: Self-pay | Admitting: General Surgery

## 2021-07-04 ENCOUNTER — Other Ambulatory Visit: Payer: Self-pay | Admitting: Family Medicine

## 2021-07-10 ENCOUNTER — Other Ambulatory Visit: Payer: Self-pay | Admitting: Family Medicine

## 2021-07-24 DIAGNOSIS — F331 Major depressive disorder, recurrent, moderate: Secondary | ICD-10-CM | POA: Diagnosis not present

## 2021-07-24 DIAGNOSIS — F411 Generalized anxiety disorder: Secondary | ICD-10-CM | POA: Diagnosis not present

## 2021-07-25 DIAGNOSIS — M1711 Unilateral primary osteoarthritis, right knee: Secondary | ICD-10-CM | POA: Diagnosis not present

## 2021-07-25 DIAGNOSIS — E1169 Type 2 diabetes mellitus with other specified complication: Secondary | ICD-10-CM | POA: Diagnosis not present

## 2021-07-25 DIAGNOSIS — R7401 Elevation of levels of liver transaminase levels: Secondary | ICD-10-CM | POA: Diagnosis not present

## 2021-07-25 DIAGNOSIS — E669 Obesity, unspecified: Secondary | ICD-10-CM | POA: Diagnosis not present

## 2021-07-25 LAB — HEMOGLOBIN A1C: Hemoglobin A1C: 7.5

## 2021-07-26 DIAGNOSIS — F5089 Other specified eating disorder: Secondary | ICD-10-CM | POA: Diagnosis not present

## 2021-07-27 DIAGNOSIS — F5089 Other specified eating disorder: Secondary | ICD-10-CM | POA: Diagnosis not present

## 2021-07-31 ENCOUNTER — Ambulatory Visit
Admission: RE | Admit: 2021-07-31 | Discharge: 2021-07-31 | Disposition: A | Discharge status: Home / Self Care | Payer: BC Managed Care – PPO | Source: Ambulatory Visit | Attending: General Surgery | Admitting: General Surgery

## 2021-07-31 ENCOUNTER — Encounter: Payer: BC Managed Care – PPO | Attending: General Surgery | Admitting: Dietician

## 2021-07-31 ENCOUNTER — Encounter: Payer: Self-pay | Admitting: Dietician

## 2021-07-31 ENCOUNTER — Other Ambulatory Visit: Payer: Self-pay

## 2021-07-31 VITALS — Ht 68.0 in | Wt 256.7 lb

## 2021-07-31 DIAGNOSIS — M159 Polyosteoarthritis, unspecified: Secondary | ICD-10-CM | POA: Diagnosis not present

## 2021-07-31 DIAGNOSIS — E669 Obesity, unspecified: Secondary | ICD-10-CM

## 2021-07-31 DIAGNOSIS — E1165 Type 2 diabetes mellitus with hyperglycemia: Secondary | ICD-10-CM

## 2021-07-31 DIAGNOSIS — K449 Diaphragmatic hernia without obstruction or gangrene: Secondary | ICD-10-CM | POA: Diagnosis not present

## 2021-07-31 DIAGNOSIS — Z6839 Body mass index (BMI) 39.0-39.9, adult: Secondary | ICD-10-CM

## 2021-07-31 DIAGNOSIS — E66812 Obesity, class 2: Secondary | ICD-10-CM

## 2021-07-31 DIAGNOSIS — E785 Hyperlipidemia, unspecified: Secondary | ICD-10-CM

## 2021-07-31 DIAGNOSIS — Z01818 Encounter for other preprocedural examination: Secondary | ICD-10-CM | POA: Diagnosis not present

## 2021-07-31 DIAGNOSIS — R7401 Elevation of levels of liver transaminase levels: Secondary | ICD-10-CM

## 2021-07-31 NOTE — Progress Notes (Signed)
Nutrition Assessment for Bariatric Surgery   Appointment start time: 3267   end time: 1600  Planned Surgery: RnY gastric bypass  Anthropometrics: Weight: 256.7lbs  Height: 5'8" BMI: 39.03   Patient's Goal Weight: 190lbs   Clinical: Medical History: osteoarthritis, Type 2 Diabetes Medications and Supplements: amitriptyline, celecoxib, Dulaglutide, multivitmin Relevant labs: HbA1C 10%, total cholesterol 234, LDL 158, HDL 59.7, triglycerides 82 (09/12/20) Notable symptoms: joint pain which limits physical activity Drug allergies: vicodin Food allergies: dairy products  Lifestyle and Dietary History:  Dieting/ weight history:  Has tried formal programs including weight watchers, Priscilla King, and others, with limited and short term success only. She also tried her own personal point system/ calorie tracking with similar results.  Joint issues worsening over past several years have limited phyiscal activity which has led to difficutly losing despite diet changes.   Diabetes medication changes over the past several years have led to some times of hypoglycemia and extra eating.    Disordered or emotional eating history:  No eating disorder history. No regular emotional eating other than food included in times of celebrating, holidays, mourning.    Physical activity: unable at this time due to joint pain.  Dietary Recall:  Daily pattern: 2 meals and 2-3 snacks. Dining out: 0 meals per week.  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 Has been a habit for the past 9 years.   Nutrition Intervention: Instructed patient on pre-op diet goals and importance of close adherence to bariatric diet after surgery to avoid side effects and complications.  Discussed stages of the bariatric diet after surgery as well as the importance of adequate protein and fluid intake.  Provided overview of 2-week pre-op diet.  Discussed importance of managing celebrations and holidays with non-food activities and traditions.    Nutrition Diagnosis: Herricks-3.3 Overweight/ obesity related to history of excess calories and inadequate physical activity due to osteoarthritis pain, as evidenced by patient with current BMI of 39, making dietary changes for weight loss prior to bariatric surgery.  Teaching method(s) utilized: Acupuncturist provided: Pre-op Goals Diet Stage Template   Learning Readiness: Change in progress  Barriers to learning/ implementing lifestyle change: none  Demonstrated degree of understanding via: Teach Back  Summary: Patient has begun making diet and lifestyle changes in effort to lose weight and prepare for bariatric surgery.  Patient's goal for weight loss is to reach about 190lbs, and to improve her health including capacity for physical activity with her young children. She has solid support from family and friends. She has researched bariatric surgery and consulted others who have undergone bariatric surgery. She agrees to work on continued balanced meals and gradual reduction of portions of carbohydrate foods prior to surgery.  She voices understanding of, and is motivated to follow the bariatric diet after surgery.  From a nutrition standpoint, she is ready to proceed with the bariatric surgery program and will be a good candidate for surgery.    Plan: Patient commits to returning for pre-op class prior  to surgery.  She will plan to return for post-op MNT  visits beginning 2 weeks after surgery.

## 2021-07-31 NOTE — Patient Instructions (Signed)
Continue with current healthy food choices. Consider gradual reduction in portions of starchy foods.

## 2021-08-03 DIAGNOSIS — F411 Generalized anxiety disorder: Secondary | ICD-10-CM | POA: Diagnosis not present

## 2021-08-03 DIAGNOSIS — F331 Major depressive disorder, recurrent, moderate: Secondary | ICD-10-CM | POA: Diagnosis not present

## 2021-08-07 ENCOUNTER — Ambulatory Visit: Payer: BC Managed Care – PPO | Admitting: Skilled Nursing Facility1

## 2021-08-08 ENCOUNTER — Encounter: Payer: Self-pay | Admitting: Family Medicine

## 2021-08-09 DIAGNOSIS — F331 Major depressive disorder, recurrent, moderate: Secondary | ICD-10-CM | POA: Diagnosis not present

## 2021-08-09 DIAGNOSIS — F411 Generalized anxiety disorder: Secondary | ICD-10-CM | POA: Diagnosis not present

## 2021-08-11 ENCOUNTER — Ambulatory Visit: Payer: BC Managed Care – PPO

## 2021-08-11 NOTE — Telephone Encounter (Signed)
Handicap Placard Application completed and placed in Dr. Lillie Fragmin office in box for signature.

## 2021-08-11 NOTE — Telephone Encounter (Signed)
Priscilla King,   Can you help send her a handicap placard - we should be able to mail it after I sign it.

## 2021-08-14 ENCOUNTER — Encounter: Payer: Self-pay | Admitting: Family Medicine

## 2021-08-14 ENCOUNTER — Telehealth (INDEPENDENT_AMBULATORY_CARE_PROVIDER_SITE_OTHER): Payer: BC Managed Care – PPO | Admitting: Family Medicine

## 2021-08-14 ENCOUNTER — Telehealth: Payer: Self-pay

## 2021-08-14 ENCOUNTER — Other Ambulatory Visit: Payer: Self-pay

## 2021-08-14 VITALS — Ht 66.0 in | Wt 259.0 lb

## 2021-08-14 DIAGNOSIS — Z6841 Body Mass Index (BMI) 40.0 and over, adult: Secondary | ICD-10-CM

## 2021-08-14 DIAGNOSIS — F331 Major depressive disorder, recurrent, moderate: Secondary | ICD-10-CM | POA: Diagnosis not present

## 2021-08-14 DIAGNOSIS — F411 Generalized anxiety disorder: Secondary | ICD-10-CM | POA: Diagnosis not present

## 2021-08-14 DIAGNOSIS — E1165 Type 2 diabetes mellitus with hyperglycemia: Secondary | ICD-10-CM

## 2021-08-14 HISTORY — DX: Morbid (severe) obesity due to excess calories: E66.01

## 2021-08-14 MED ORDER — TIRZEPATIDE 2.5 MG/0.5ML ~~LOC~~ SOAJ: 2.5000 mg | SUBCUTANEOUS | 1 refills | Status: DC

## 2021-08-14 NOTE — Telephone Encounter (Signed)
PA submitted for Mounjaro through covermymeds. Key: [id number]  Your information has been submitted to [place]. [employer] [place] will review the request and notify you of the determination decision directly, typically within 72 hours of receiving all information.  You will also receive your request decision electronically. To check for an update later, open this request again from your dashboard.  If [employer] [place] has not responded within the specified timeframe or if you have any questions about your PA submission, contact [employer] directly at [phone].

## 2021-08-14 NOTE — Progress Notes (Signed)
Priscilla King T. Priscilla Cirilo, MD Primary Care and [place] at [facility] [place], [address] Phone: [phone]   FAX: [phone]  Priscilla King - 46 y.o. female   MRN [id number]   Date of Birth: February 09, 1976  Visit Date: 08/14/2021   PCP: Owens Loffler, MD   Referred by: Owens Loffler, MD  Virtual Visit via Video Note:  I connected with  Priscilla King on 08/14/2021 10:20 AM EST by a video enabled telemedicine application and verified that I am speaking with the correct person using two identifiers.   Location patient: home computer, tablet, or smartphone Location provider: work or home office Consent: Verbal consent directly obtained from [name]. Persons participating in the virtual visit: patient, provider  I discussed the limitations of evaluation and management by telemedicine and the availability of in person appointments. The patient expressed understanding and agreed to proceed.  Chief Complaint  Patient presents with   Medication Management    Discuss Alternative to Trulicity    History of Present Illness:  Change to [facility].  Bariatric surgeons did not think that she was a good bariatric surgical candidate. BMI is 42.  She is currently on Trulicity 1.5 mg, and she has not lost any weight.  She is also been working with nutrition.  Most recent A1c is 7.5.  Has been having a lot of hip pain.  Has not been able to take Celebrex. December - woke up with a severe migraine.  Went to the dentitist. - did not think that was it at all.     Did not take any oral beds for a few days.  Then the migraine went away.  Celebrex - thinks that it came from that.   Will have a welt from [place].  Started to show up around the same time - ? Can feel like it is swollen.    Mounjaro  Review of Systems as above: See pertinent positives and pertinent negatives per HPI No acute distress verbally    Observations/Objective/Exam:  An attempt was made to discern vital signs over the phone and per patient if applicable and possible.   General:    Alert, Oriented, appears well and in no acute distress  Pulmonary:     On inspection no signs of respiratory distress.  Psych / Neurological:     Pleasant and cooperative.  Assessment and Plan:    ICD-10-CM   1. Uncontrolled type 2 diabetes mellitus with hyperglycemia, without long-term current use of insulin (HCC)  E11.65     2. Morbid obesity with BMI of 40.0-44.9, adult (HCC)  E66.01    Z68.41      Total encounter time: 32 minutes. This includes total time spent on the day of encounter.  Extensive chart review, and additional literature review on the day of video examination.  Discontinue Trulicity on Thursday.  Start [place], with an additional dose of 2.5 mg.  She is going to monitor her blood sugars with her continuous monitor.  Follow-up in the office in 4 weeks.  I am optimistic that she will lose significantly more weight with this. -Ideally, we can only keep her on this 1 diabetic medication.  While initially she was approved, prior to surgery she was not approved by her insurance for bariatric surgery.  She is going to work through [employer] process with this.  I discussed the assessment and treatment plan with the patient. The patient was  provided an opportunity to ask questions and all were answered. The patient agreed with the plan and demonstrated an understanding of the instructions.   The patient was advised to call back or seek an in-person evaluation if the symptoms worsen or if the condition fails to improve as anticipated.  Follow-up: prn unless noted otherwise below Return in about 1 month (around 09/14/2021) for diabetes follow-up in the office.  Meds ordered this encounter  Medications   tirzepatide (MOUNJARO) 2.5 MG/0.5ML Pen    Sig: Inject 2.5 mg into the skin once a week.    Dispense:  2 mL    Refill:   1   No orders of the defined types were placed in this encounter.   Signed,  Maud Deed. Kellis Topete, MD

## 2021-08-16 MED ORDER — OZEMPIC (0.25 OR 0.5 MG/DOSE) 2 MG/1.5ML ~~LOC~~ SOPN: 0.2500 mg | PEN_INJECTOR | SUBCUTANEOUS | 1 refills | Status: DC

## 2021-08-16 NOTE — Addendum Note (Signed)
Addended by: Owens Loffler on: 08/16/2021 01:17 PM   Modules accepted: Orders

## 2021-08-16 NOTE — Telephone Encounter (Signed)
PA denied.  Priscilla King is approved when two alternative medications on the member's formulary have been tried and did not work or the member is unable to take all of the alternatives due to interactions, side effects, etc.  In this case only one of the alternative medications has been tried: Musician.  Alternative medications include: Ozempic, Rybelsus and Victoza.

## 2021-08-16 NOTE — Telephone Encounter (Signed)
Can you check with her about denial?  Ozempic also helps a lot with weight loss, too, and I would try this medication that is on her formulary.

## 2021-08-16 NOTE — Telephone Encounter (Signed)
° °  Meds ordered this encounter  Medications   Semaglutide,0.25 or 0.5MG /DOS, (OZEMPIC, 0.25 OR 0.5 MG/DOSE,) 2 MG/1.5ML SOPN    Sig: Inject 0.25 mg into the skin once a week.    Dispense:  1.5 mL    Refill:  1   Medications Discontinued During This Encounter  Medication Reason   tirzepatide [facility]) 2.5 MG/0.5ML Pen

## 2021-08-16 NOTE — Addendum Note (Signed)
Addended by: Owens Loffler on: 08/16/2021 01:35 PM   Modules accepted: Orders

## 2021-08-16 NOTE — Telephone Encounter (Signed)
Left message for Priscilla King that her insurance denied the PA for the [facility] because she must try 2 medications on her formulary first.  Dr. Lorelei Pont recommends Ozempic to try next.  I ask that she call me back or MyChart me if she would like Dr. Lorelei Pont to send in Rx for the [place].  I as lo let her know that I mailed her handicap placard application out today.

## 2021-08-16 NOTE — Telephone Encounter (Signed)
Spoke with [employer].  She is agreeable to trying the Ozempic.  [facility].

## 2021-08-17 ENCOUNTER — Encounter: Payer: Self-pay | Admitting: Family Medicine

## 2021-08-17 MED ORDER — OZEMPIC (0.25 OR 0.5 MG/DOSE) 2 MG/1.5ML ~~LOC~~ SOPN: 0.2500 mg | PEN_INJECTOR | SUBCUTANEOUS | 1 refills | Status: DC

## 2021-08-17 NOTE — Telephone Encounter (Signed)
Priscilla King,   Can you see if [facility] or [facility] have ozempic?

## 2021-08-21 DIAGNOSIS — F411 Generalized anxiety disorder: Secondary | ICD-10-CM | POA: Diagnosis not present

## 2021-08-21 DIAGNOSIS — F331 Major depressive disorder, recurrent, moderate: Secondary | ICD-10-CM | POA: Diagnosis not present

## 2021-09-04 DIAGNOSIS — F411 Generalized anxiety disorder: Secondary | ICD-10-CM | POA: Diagnosis not present

## 2021-09-04 DIAGNOSIS — F331 Major depressive disorder, recurrent, moderate: Secondary | ICD-10-CM | POA: Diagnosis not present

## 2021-09-11 DIAGNOSIS — F411 Generalized anxiety disorder: Secondary | ICD-10-CM | POA: Diagnosis not present

## 2021-09-11 DIAGNOSIS — E119 Type 2 diabetes mellitus without complications: Secondary | ICD-10-CM | POA: Diagnosis not present

## 2021-09-11 DIAGNOSIS — F331 Major depressive disorder, recurrent, moderate: Secondary | ICD-10-CM | POA: Diagnosis not present

## 2021-09-11 LAB — HM DIABETES EYE EXAM

## 2021-09-13 ENCOUNTER — Telehealth: Payer: BC Managed Care – PPO | Admitting: Family Medicine

## 2021-09-13 ENCOUNTER — Encounter: Payer: Self-pay | Admitting: Family Medicine

## 2021-09-13 VITALS — BP 177/97 | HR 100 | Ht 66.0 in | Wt 259.1 lb

## 2021-09-13 DIAGNOSIS — E1165 Type 2 diabetes mellitus with hyperglycemia: Secondary | ICD-10-CM

## 2021-09-13 DIAGNOSIS — Z6841 Body Mass Index (BMI) 40.0 and over, adult: Secondary | ICD-10-CM | POA: Diagnosis not present

## 2021-09-13 MED ORDER — OZEMPIC (0.25 OR 0.5 MG/DOSE) 2 MG/1.5ML ~~LOC~~ SOPN: 0.5000 mg | PEN_INJECTOR | SUBCUTANEOUS | 5 refills | Status: DC

## 2021-09-13 NOTE — Progress Notes (Signed)
Jerico Grisso T. Jasmene Goswami, MD Primary [facility] and [facility] [place], [address] Phone: [phone]   FAX: [phone]  COLETA GROSSHANS - 46 y.o. female   MRN [id number]   Date of Birth: [birth year]  Visit Date: 09/13/2021   PCP: Owens Loffler, MD   Referred by: Owens Loffler, MD  Virtual Visit via Video Note:  I connected with  [name] on 09/13/2021 11:20 AM EST by a video enabled telemedicine application and verified that I am speaking with the correct person using two identifiers.   Location patient: home computer, tablet, or smartphone Location provider: work or home office Consent: Verbal consent directly obtained from [name]. Persons participating in the virtual visit: patient, provider  I discussed the limitations of evaluation and management by telemedicine and the availability of in person appointments. The patient expressed understanding and agreed to proceed.  Chief Complaint  Patient presents with   Follow-up    Ozempic    History of Present Illness:  Follow-up poorly controlled diabetes and morbid obesity.  I did recently place her on Ozempic.  She brings in her blood sugar log.  Diabetes Mellitus: Tolerating Medications: yes Compliance with diet: fair, There is no height or weight on file to calculate BMI. Exercise: minimal / intermittent Avg blood sugars at home: Yes, she does have the [place] system  foot problems: none Hypoglycemia: none No nausea, vomitting, blurred vision, polyuria.  Also has a cold.    Did her 4th dose of Ozempic - tomorrow will be a month. Feels different for the Trulicity. - with Trulicity felt full. BS:  FBS 175-247 Post-pandrial 117 - 173 121 - 201 Up to 311 at night.  Go to 0.5 mg of Ozempic  Wt Readings from Last 3 Encounters:  09/13/21 259 lb 2 oz (117.5 kg)  08/14/21 259 lb (117.5 kg)  07/31/21 256 lb 11.2 oz (116.4 kg)    Incr  to 0.5 mg  Review of Systems as above: See pertinent positives and pertinent negatives per HPI No acute distress verbally   Observations/Objective/Exam:  An attempt was made to discern vital signs over the phone and per patient if applicable and possible.   General:    Alert, Oriented, appears well and in no acute distress  Pulmonary:     On inspection no signs of respiratory distress.  Psych / Neurological:     Pleasant and cooperative.  Assessment and Plan:    ICD-10-CM   1. Uncontrolled type 2 diabetes mellitus with hyperglycemia, without long-term current use of insulin (HCC)  E11.65     2. Morbid obesity with BMI of 40.0-44.9, adult ([place])  E66.01    Z68.41      Blood sugars have been higher and less well controlled with transition to Ozempic from [place].  She also has not any weight loss.  Increase Ozempic dosing.  Hopefully, she can get in the pool to move more.  Hopefully, higher doses of Ozempic will instigate more weight loss.  She does have some runny nose and sore throat symptoms as well as some other rhinosinusitis symptoms.  Recommended that she recheck a COVID test on Friday, then repeat on Sunday.  If positive, she will contact me directly.  I discussed the assessment and treatment plan with the patient. The patient was provided an opportunity to ask questions and all were answered. The patient agreed with the plan and demonstrated an understanding  of the instructions.   The patient was advised to call back or seek an in-person evaluation if the symptoms worsen or if the condition fails to improve as anticipated.  Follow-up: prn unless noted otherwise below No follow-ups on file.  Meds ordered this encounter  Medications   Semaglutide,0.25 or 0.5MG /DOS, (OZEMPIC, 0.25 OR 0.5 MG/DOSE,) 2 MG/1.5ML SOPN    Sig: Inject 0.5 mg into the skin once a week.    Dispense:  3 mL    Refill:  5   No orders of the defined types were placed in this  encounter.   Signed,  Maud Deed. Missy Baksh, MD

## 2021-09-25 DIAGNOSIS — F411 Generalized anxiety disorder: Secondary | ICD-10-CM | POA: Diagnosis not present

## 2021-09-25 DIAGNOSIS — F331 Major depressive disorder, recurrent, moderate: Secondary | ICD-10-CM | POA: Diagnosis not present

## 2021-10-09 DIAGNOSIS — F411 Generalized anxiety disorder: Secondary | ICD-10-CM | POA: Diagnosis not present

## 2021-10-09 DIAGNOSIS — F331 Major depressive disorder, recurrent, moderate: Secondary | ICD-10-CM | POA: Diagnosis not present

## 2021-10-18 ENCOUNTER — Encounter: Payer: Self-pay | Admitting: Family Medicine

## 2021-10-18 DIAGNOSIS — Z1211 Encounter for screening for malignant neoplasm of colon: Secondary | ICD-10-CM

## 2021-10-18 DIAGNOSIS — Z800 Family history of malignant neoplasm of digestive organs: Secondary | ICD-10-CM

## 2021-10-19 MED ORDER — SEMAGLUTIDE (1 MG/DOSE) 4 MG/3ML ~~LOC~~ SOPN
1.0000 mg | PEN_INJECTOR | SUBCUTANEOUS | 5 refills | Status: DC
Start: 2021-10-19 — End: 2022-01-02

## 2021-10-24 ENCOUNTER — Other Ambulatory Visit: Payer: Self-pay

## 2021-10-24 DIAGNOSIS — Z1211 Encounter for screening for malignant neoplasm of colon: Secondary | ICD-10-CM

## 2021-10-24 MED ORDER — NA SULFATE-K SULFATE-MG SULF 17.5-3.13-1.6 GM/177ML PO SOLN
1.0000 | Freq: Once | ORAL | 0 refills | Status: AC
Start: 2021-10-24 — End: 2021-10-24

## 2021-10-24 NOTE — Progress Notes (Signed)
Gastroenterology Pre-Procedure Review ? ?Request Date: 11/01/2021 ?Requesting Physician: Dr. Vicente Males ? ? ?PATIENT REVIEW QUESTIONS: The patient responded to the following health history questions as indicated:   ? ?1. Are you having any GI issues? no ?2. Do you have a personal history of Polyps? no ?3. Do you have a family history of Colon Cancer or Polyps? yes (colon cancer) ?4. Diabetes Mellitus? yes (type 2) ?5. Joint replacements in the past 12 months?no ?6. Major health problems in the past 3 months?no ?7. Any artificial heart valves, MVP, or defibrillator?no ?   ?MEDICATIONS & ALLERGIES:    ?Patient reports the following regarding taking any anticoagulation/antiplatelet therapy:   ?Plavix, Coumadin, Eliquis, Xarelto, Lovenox, Pradaxa, Brilinta, or Effient? no ?Aspirin? no ? ?Patient confirms/reports the following medications:  ?Current Outpatient Medications  ?Medication Sig Dispense Refill  ? amitriptyline (ELAVIL) 10 MG tablet TAKE 1 TO 2 TABLETS (10-20 MG TOTAL) BY MOUTH AT BEDTIME 180 tablet 0  ? Continuous Blood Gluc Sensor (FREESTYLE LIBRE 2 SENSOR) MISC Place 1 application onto the skin every 14 (fourteen) days. 2 each 2  ? diclofenac sodium (VOLTAREN) 1 % GEL Apply 2 g topically 4 (four) times daily. 3 Tube 5  ? Multiple Vitamin (MULTIVITAMIN ADULT PO) Take by mouth.    ? Semaglutide, 1 MG/DOSE, 4 MG/3ML SOPN Inject 1 mg as directed once a week. 3 mL 5  ? ?No current facility-administered medications for this visit.  ? ? ?Patient confirms/reports the following allergies:  ?Allergies  ?Allergen Reactions  ? Vicodin [Hydrocodone-Acetaminophen] Swelling  ?  Tongue swelling and headache  ? ? ?No orders of the defined types were placed in this encounter. ? ? ?AUTHORIZATION INFORMATION ?Primary Insurance: ?1D#: ?Group #: ? ?Secondary Insurance: ?1D#: ?Group #: ? ?SCHEDULE INFORMATION: ?Date: 11/01/2021 ?Time: ?Location:[facility] ? ?

## 2021-10-25 DIAGNOSIS — F331 Major depressive disorder, recurrent, moderate: Secondary | ICD-10-CM | POA: Diagnosis not present

## 2021-10-25 DIAGNOSIS — F411 Generalized anxiety disorder: Secondary | ICD-10-CM | POA: Diagnosis not present

## 2021-10-31 ENCOUNTER — Encounter: Payer: Self-pay | Admitting: Gastroenterology

## 2021-11-01 ENCOUNTER — Encounter: Payer: Self-pay | Admitting: Gastroenterology

## 2021-11-01 ENCOUNTER — Ambulatory Visit: Payer: BC Managed Care – PPO | Admitting: Anesthesiology

## 2021-11-01 ENCOUNTER — Encounter
Admission: RE | Disposition: A | Discharge status: Home / Self Care | Payer: Self-pay | Source: Home / Self Care | Attending: Gastroenterology

## 2021-11-01 ENCOUNTER — Ambulatory Visit
Admission: RE | Admit: 2021-11-01 | Discharge: 2021-11-01 | Disposition: A | Discharge status: Home / Self Care | Payer: BC Managed Care – PPO | Attending: Gastroenterology | Admitting: Gastroenterology

## 2021-11-01 DIAGNOSIS — K635 Polyp of colon: Secondary | ICD-10-CM

## 2021-11-01 DIAGNOSIS — D120 Benign neoplasm of cecum: Secondary | ICD-10-CM | POA: Diagnosis not present

## 2021-11-01 DIAGNOSIS — F32A Depression, unspecified: Secondary | ICD-10-CM | POA: Insufficient documentation

## 2021-11-01 DIAGNOSIS — Z79899 Other long term (current) drug therapy: Secondary | ICD-10-CM | POA: Insufficient documentation

## 2021-11-01 DIAGNOSIS — E119 Type 2 diabetes mellitus without complications: Secondary | ICD-10-CM | POA: Diagnosis not present

## 2021-11-01 DIAGNOSIS — Z1211 Encounter for screening for malignant neoplasm of colon: Secondary | ICD-10-CM | POA: Diagnosis not present

## 2021-11-01 DIAGNOSIS — Z6837 Body mass index (BMI) 37.0-37.9, adult: Secondary | ICD-10-CM | POA: Diagnosis not present

## 2021-11-01 HISTORY — PX: COLONOSCOPY WITH PROPOFOL: SHX5780

## 2021-11-01 LAB — POCT PREGNANCY, URINE: Preg Test, Ur: NEGATIVE

## 2021-11-01 SURGERY — COLONOSCOPY WITH PROPOFOL
Anesthesia: General

## 2021-11-01 MED ORDER — LIDOCAINE HCL (PF) 2 % IJ SOLN
INTRAMUSCULAR | Status: AC
  Filled 2021-11-01: qty 5

## 2021-11-01 MED ORDER — PROPOFOL 10 MG/ML IV BOLUS
INTRAVENOUS | Status: DC | PRN
  Administered 2021-11-01: 90 ug/kg/min via INTRAVENOUS
  Administered 2021-11-01: 100 mg via INTRAVENOUS

## 2021-11-01 MED ORDER — PROPOFOL 10 MG/ML IV BOLUS
INTRAVENOUS | Status: AC
Start: 2021-11-01 — End: ?
  Filled 2021-11-01: qty 20

## 2021-11-01 MED ORDER — LIDOCAINE HCL (CARDIAC) PF 100 MG/5ML IV SOSY
PREFILLED_SYRINGE | INTRAVENOUS | Status: DC | PRN
  Administered 2021-11-01: 40 mg via INTRAVENOUS

## 2021-11-01 MED ORDER — PROPOFOL 10 MG/ML IV BOLUS
INTRAVENOUS | Status: AC
  Filled 2021-11-01: qty 20

## 2021-11-01 MED ORDER — STERILE WATER FOR IRRIGATION IR SOLN
Status: DC | PRN
  Administered 2021-11-01: 60 mL

## 2021-11-01 MED ORDER — SODIUM CHLORIDE 0.9 % IV SOLN
INTRAVENOUS | Status: DC
  Administered 2021-11-01: 1000 mL via INTRAVENOUS

## 2021-11-01 NOTE — H&P (Signed)
? ? ? ?Jonathon Bellows, MD ?[address], [place], [place], [place], [address] ?[address], [place], [place], [place], [address] ?Phone: [phone]  ?Fax: [phone] ? ?Primary Care Physician:  Owens Loffler, MD ? ? ?Pre-Procedure History & Physical: ?HPI:  Priscilla King is a 46 y.o. female is here for an colonoscopy. ?  ?Past Medical History:  ?Diagnosis Date  ? Diabetes mellitus type 2, uncontrolled, without complications 44/3154  ? History of polycystic ovaries   ? Hyperlipidemia LDL goal <70 06/2014  ? Kienbock's avascular necrosis of lunate, adult   ? Morbid obesity with BMI of 40.0-44.9, adult ([place]) 08/14/2021  ? ? ?Past Surgical History:  ?Procedure Laterality Date  ? CESAREAN SECTION N/A 08/31/2012  ? Procedure: CESAREAN SECTION;  Surgeon: Mora Bellman, MD;  Location: [facility];  Service: Obstetrics;  Laterality: N/A;  ? CESAREAN SECTION N/A 03/20/2017  ? Procedure: REPEAT CESAREAN SECTION;  Surgeon: Rubie Maid, MD;  Location: [facility];  Service: Obstetrics;  Laterality: N/A;  Female born @ 1500 ?Apgars: 8/9 ?Weight:10lbs 9ozs  ? ? ?Prior to Admission medications   ?Medication Sig Start Date End Date Taking? Authorizing Provider  ?amitriptyline (ELAVIL) 10 MG tablet TAKE 1 TO 2 TABLETS (10-20 MG TOTAL) BY MOUTH AT BEDTIME 07/05/21  Yes Copland, Spencer, MD  ?Continuous Blood Gluc Sensor (FREESTYLE LIBRE 2 SENSOR) MISC Place 1 application onto the skin every 14 (fourteen) days. 05/15/21  Yes Copland, Frederico Hamman, MD  ?diclofenac sodium (VOLTAREN) 1 % GEL Apply 2 g topically 4 (four) times daily. 10/14/17  Yes Copland, Frederico Hamman, MD  ?Multiple Vitamin (MULTIVITAMIN ADULT PO) Take by mouth.   Yes [provider]  ?Semaglutide, 1 MG/DOSE, 4 MG/3ML SOPN Inject 1 mg as directed once a week. 10/19/21  Yes Copland, Frederico Hamman, MD  ? ? ?Allergies as of 10/24/2021 - Review Complete 10/24/2021  ?Allergen Reaction Noted  ? Vicodin [hydrocodone-acetaminophen] Swelling 05/24/2011  ? ? ?Family History  ?Problem  Relation Age of Onset  ? Stroke Mother   ?     CVA (TIA)  ? Diabetes Mother   ? Healthy Sister   ? Healthy Daughter   ? Diabetes Maternal Grandmother   ? Hypertension Maternal Grandmother   ? Diabetes Paternal Grandmother   ? Hypertension Paternal Grandmother   ? Healthy Brother   ? Healthy Brother   ? Healthy Son   ? Breast cancer Neg Hx   ? ? ?Social History  ? ?Socioeconomic History  ? Marital status: Married  ?  Spouse name: Not on file  ? Number of children: 1  ? Years of education: Not on file  ? Highest education level: Not on file  ?Occupational History  ? Occupation: Professor  ?  Employer: [employer]  ?  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  Employer: [employer]  ?  Employer: [employer]  ?Tobacco Use  ? Smoking status: Never  ? Smokeless tobacco: Never  ?Vaping Use  ? Vaping Use: Never used  ?Substance and Sexual Activity  ? Alcohol use: No  ? Drug use: No  ? Sexual activity: Yes  ?  Birth control/protection: None  ?  Comment: IUD removed in Oct 2017  ?Other Topics Concern  ? Not on file  ?Social History Narrative  ? Married, [place]  ? [place]  ? No exercise- knee  ? Professor of Math at [employer]  ? ?Social Determinants of Health  ? ?Financial Resource Strain: Not on file  ?Food Insecurity: Not on file  ?Transportation Needs: Not  on file  ?Physical Activity: Not on file  ?Stress: Not on file  ?Social Connections: Not on file  ?Intimate Partner Violence: Not on file  ? ? ?Review of Systems: ?See HPI, otherwise negative ROS ? ?Physical Exam: ?BP 123/77   Pulse 85   Temp (!) 97.4 ?F (36.3 ?C) (Temporal)   Resp 18   Ht '5\' 8"'$  (1.727 m)   Wt 111.4 kg   LMP 10/25/2021 Comment: pregnancy test negative  SpO2 100%   BMI 37.33 kg/m?  ?General:   Alert,  pleasant and cooperative in NAD ?Head:  Normocephalic and atraumatic. ?Neck:  Supple; no masses or thyromegaly. ?Lungs:  Clear throughout to auscultation, normal respiratory effort.    ?Heart:  +S1, +S2, Regular rate and rhythm, No edema. ?Abdomen:  Soft, nontender and  nondistended. Normal bowel sounds, without guarding, and without rebound.   ?Neurologic:  Alert and  oriented x4;  grossly normal neurologically. ? ?Impression/Plan: ?Priscilla King is here for an colonoscopy to be performed for Screening colonoscopy average risk   ?Risks, benefits, limitations, and alternatives regarding  colonoscopy have been reviewed with the patient.  Questions have been answered.  All parties agreeable. ? ? ?Jonathon Bellows, MD  11/01/2021, 8:48 AM ? ?

## 2021-11-01 NOTE — Transfer of Care (Signed)
Immediate Anesthesia Transfer of Care Note ? ?Patient: Priscilla King ? ?Procedure(s) Performed: COLONOSCOPY WITH PROPOFOL ? ?Patient Location: PACU ? ?Anesthesia Type:General ? ?Level of Consciousness: awake ? ?Airway & Oxygen Therapy: Patient Spontanous Breathing ? ?Post-op Assessment: Report given to RN and Post -op Vital signs reviewed and stable ? ?Post vital signs: Reviewed and stable ? ?Last Vitals:  ?Vitals Value Taken Time  ?BP 111/70 11/01/21 0918  ?Temp 36.1 ?C 11/01/21 0917  ?Pulse 93 11/01/21 0919  ?Resp 16 11/01/21 0919  ?SpO2 100 % 11/01/21 0919  ?Vitals shown include unvalidated device data. ? ?Last Pain:  ?Vitals:  ? 11/01/21 0917  ?TempSrc: Temporal  ?PainSc: 0-No pain  ?   ? ?  ? ?Complications: No notable events documented. ?

## 2021-11-01 NOTE — Anesthesia Postprocedure Evaluation (Signed)
Anesthesia Post Note ? ?Patient: Priscilla King ? ?Procedure(s) Performed: COLONOSCOPY WITH PROPOFOL ? ?Patient location during evaluation: Endoscopy ?Anesthesia Type: General ?Level of consciousness: awake and alert ?Pain management: pain level controlled ?Vital Signs Assessment: post-procedure vital signs reviewed and stable ?Respiratory status: spontaneous breathing, nonlabored ventilation, respiratory function stable and patient connected to nasal cannula oxygen ?Cardiovascular status: blood pressure returned to baseline and stable ?Postop Assessment: no apparent nausea or vomiting ?Anesthetic complications: no ? ? ?No notable events documented. ? ? ?Last Vitals:  ?Vitals:  ? 11/01/21 0927 11/01/21 0937  ?BP: 114/76 120/83  ?Pulse:    ?Resp:    ?Temp:    ?SpO2:    ?  ?Last Pain:  ?Vitals:  ? 11/01/21 0937  ?TempSrc:   ?PainSc: 0-No pain  ? ? ?  ?  ?  ?  ?  ?  ? ?Martha Clan ? ? ? ? ?

## 2021-11-01 NOTE — Op Note (Signed)
[facility] ?Gastroenterology ?Patient Name: Priscilla King ?Procedure Date: 11/01/2021 8:47 AM ?MRN: [id number] ?Account #: [id number] ?Date of Birth: 1975-10-19 ?Admit Type: Outpatient ?Age: 46 ?Room: [facility] ENDO ROOM 3 ?Gender: Female ?Note Status: Finalized ?Instrument Name: Colonscope [id number] ?Procedure:             Colonoscopy ?Indications:           Screening for colorectal malignant neoplasm ?Providers:             Jonathon Bellows MD, MD ?Referring MD:          Maud Deed. Copland MD, MD (Referring MD) ?Medicines:             Monitored Anesthesia Care ?Complications:         No immediate complications. ?Procedure:             Pre-Anesthesia Assessment: ?                       - Prior to the procedure, a History and Physical was  ?                       performed, and patient medications, allergies and  ?                       sensitivities were reviewed. The patient's tolerance  ?                       of previous anesthesia was reviewed. ?                       - The risks and benefits of the procedure and the  ?                       sedation options and risks were discussed with the  ?                       patient. All questions were answered and informed  ?                       consent was obtained. ?                       - ASA Grade Assessment: II - A patient with mild  ?                       systemic disease. ?                       After obtaining informed consent, the colonoscope was  ?                       passed under direct vision. Throughout the procedure,  ?                       the patient's blood pressure, pulse, and oxygen  ?                       saturations were monitored continuously. The  ?                       Colonoscope  was introduced through the anus and  ?                       advanced to the the cecum, identified by the  ?                       appendiceal orifice. The colonoscopy was performed  ?                       with ease. The patient tolerated the procedure  well.  ?                       The quality of the bowel preparation was excellent. ?Findings: ?     The perianal and digital rectal examinations were normal. ?     A 6 mm polyp was found in the cecum. The polyp was sessile. The polyp  ?     was removed with a cold snare. Resection and retrieval were complete. ?     The exam was otherwise without abnormality. ?     The exam was otherwise without abnormality on direct and retroflexion  ?     views. ?Impression:            - One 6 mm polyp in the cecum, removed with a cold  ?                       snare. Resected and retrieved. ?                       - The examination was otherwise normal. ?                       - The examination was otherwise normal on direct and  ?                       retroflexion views. ?Recommendation:        - Discharge patient to home (with escort). ?                       - Resume previous diet. ?                       - Continue present medications. ?                       - Await pathology results. ?                       - Repeat colonoscopy for surveillance based on  ?                       pathology results. ?Procedure Code(s):     --- Professional --- ?                       [id number], Colonoscopy, flexible; with removal of  ?                       tumor(s), polyp(s), or other lesion(s) by snare  ?  technique ?Diagnosis Code(s):     --- Professional --- ?                       Z12.11, Encounter for screening for malignant neoplasm  ?                       of colon ?                       K63.5, Polyp of colon ?CPT copyright 2019 American Medical Association. All rights reserved. ?The codes documented in this report are preliminary and upon coder review may  ?be revised to meet current compliance requirements. ?Jonathon Bellows, MD ?Jonathon Bellows MD, MD ?11/01/2021 9:15:15 AM ?This report has been signed electronically. ?Number of Addenda: 0 ?Note Initiated On: 11/01/2021 8:47 AM ?Scope Withdrawal Time: 0 hours 9 minutes 16  seconds  ?Total Procedure Duration: 0 hours 11 minutes 14 seconds  ?Estimated Blood Loss:  Estimated blood loss: none. ?     [facility] ?

## 2021-11-01 NOTE — Anesthesia Preprocedure Evaluation (Signed)
Anesthesia Evaluation  ?Patient identified by MRN, date of birth, ID band ?Patient awake ? ? ? ?Reviewed: ?Allergy & Precautions, H&P , NPO status , Patient's Chart, lab work & pertinent test results ? ?History of Anesthesia Complications ?Negative for: history of anesthetic complications ? ?Airway ?Mallampati: III ? ?TM Distance: >3 FB ?Neck ROM: full ? ? ? Dental ? ?(+) Chipped, Dental Advidsory Given ?  ?Pulmonary ?neg pulmonary ROS, neg shortness of breath,  ?  ? ? ? ? ? ? ? Cardiovascular ?Exercise Tolerance: Good ?(-) hypertension(-) angina(-) Past MI negative cardio ROS ? ? ? ? ?  ?Neuro/Psych ?PSYCHIATRIC DISORDERS Depression   ? GI/Hepatic ?negative GI ROS, neg GERD  ,  ?Endo/Other  ?diabetes, Type 2 ? Renal/GU ?  ?negative genitourinary ?  ?Musculoskeletal ? ?(+) Arthritis ,  ? Abdominal ?  ?Peds ? Hematology ?negative hematology ROS ?(+)   ?Anesthesia Other Findings ?Patient reports no problems with percocet  ? ?Past Medical History: ?06/2014: Diabetes mellitus type 2, uncontrolled, without  ?complications ([place]) ?No date: History of polycystic ovaries ?06/2014: Hyperlipidemia LDL goal <70 ?No date: Kienbock's avascular necrosis of lunate, adult ?No date: Obesity ?No date: Seronegative arthritis ?    Comment:  possible, [place], [place] ? ?Past Surgical History: ?08/31/2012: CESAREAN SECTION; N/A ?    Comment:  Procedure: CESAREAN SECTION;  Surgeon: Mora Bellman,  ?             MD;  Location: [facility];  Service: Obstetrics;  Laterality: ?             N/A; ? ? ? ? Reproductive/Obstetrics ?negative OB ROS ? ?  ? ? ? ? ? ? ? ? ? ? ? ? ? ?  ?  ? ? ? ? ? ? ? ? ?Anesthesia Physical ? ?Anesthesia Plan ? ?ASA: 2 ? ?Anesthesia Plan: General  ? ?Post-op Pain Management:   ? ?Induction: Intravenous ? ?PONV Risk Score and Plan: 3 and Propofol infusion and TIVA ? ?Airway Management Planned: Natural Airway and Nasal Cannula ? ?Additional Equipment:  ? ?Intra-op Plan:  ? ?Post-operative Plan:   ? ?Informed Consent: I have reviewed the patients History and Physical, chart, labs and discussed the procedure including the risks, benefits and alternatives for the proposed anesthesia with the patient or authorized representative who has indicated his/her understanding and acceptance.  ? ? ? ?Dental Advisory Given ? ?Plan Discussed with: Anesthesiologist, CRNA and Surgeon ? ?Anesthesia Plan Comments: (Patient consented for risks of anesthesia including but not limited to:  ?- adverse reactions to medications ?- risk of bleeding, infection, nerve damage and headache ?- risk of failed spinal ?- damage to teeth, lips or other oral mucosa ?- sore throat or hoarseness ?- Damage to heart, brain, lungs or loss of life ? ?Patient voiced understanding.)  ? ? ? ? ? ? ?Anesthesia Quick Evaluation ? ?

## 2021-11-02 ENCOUNTER — Encounter: Payer: Self-pay | Admitting: Gastroenterology

## 2021-11-02 LAB — SURGICAL PATHOLOGY

## 2021-11-06 DIAGNOSIS — F331 Major depressive disorder, recurrent, moderate: Secondary | ICD-10-CM | POA: Diagnosis not present

## 2021-11-06 DIAGNOSIS — F411 Generalized anxiety disorder: Secondary | ICD-10-CM | POA: Diagnosis not present

## 2021-11-07 ENCOUNTER — Encounter: Payer: Self-pay | Admitting: Gastroenterology

## 2021-11-20 DIAGNOSIS — F411 Generalized anxiety disorder: Secondary | ICD-10-CM | POA: Diagnosis not present

## 2021-11-20 DIAGNOSIS — F331 Major depressive disorder, recurrent, moderate: Secondary | ICD-10-CM | POA: Diagnosis not present

## 2021-11-27 DIAGNOSIS — F411 Generalized anxiety disorder: Secondary | ICD-10-CM | POA: Diagnosis not present

## 2021-11-27 DIAGNOSIS — F331 Major depressive disorder, recurrent, moderate: Secondary | ICD-10-CM | POA: Diagnosis not present

## 2021-12-04 NOTE — Progress Notes (Signed)
? ? ?GYNECOLOGY ANNUAL PHYSICAL EXAM PROGRESS NOTE ? ?Subjective:  ? ? Priscilla King is a 46 y.o. G69P2012 female who presents for an annual exam.  The patient is sexually active. The patient participates in regular exercise: yes. Has the patient ever been transfused or tattooed?: no. The patient reports that there is not domestic violence in her life.  ? ?The patient has the following complaints today: ? Has a h/o of right lower quadrant pain (ovarian pain) for the past several years, notes that this has increased in intensity over the past several months.  Also notes that her cycles have become heavier and longer (previously 3-5 days, now lasting 5-7 days).  Now going through 2 super plus pads in 1-2 hours.  ? ?Menstrual History: ?Menarche age: 6 ?LMP: 11/24/2021 ?Period Duration (Days): 5 days ?Period Pattern: Regular ?Menstrual Flow: Heavy ?Menstrual Control: Maxi pad ?Menstrual Control Change Freq (Hours): 1 hour ?Dysmenorrhea: (!) Severe ?Dysmenorrhea Symptoms: Cramping ? ? ?Gynecologic History:  ?Contraception: vasectomy ?History of STI's: Denies ?Last Pap: 01/29/2020 Results were: normal.  Denies h/o abnormal pap smears. ?Last mammogram: 03/31/2021. Results were: normal ? ? ?OB History  ?Gravida Para Term Preterm AB Living  ?'3 2 2 '$ 0 1 2  ?SAB IAB Ectopic Multiple Live Births  ?0 1 0 0 2  ?  ?# Outcome Date GA Lbr Len/2nd Weight Sex Delivery Anes PTL Lv  ?3 Term 03/20/17 [redacted]w[redacted]d 10 lb 9 oz (4.79 kg) F CS-Vac Spinal  LIV  ?   Name: PriscillaELISE King  ?   Apgar1: 8  Apgar5: 9  ?2 Term 08/31/12 345w1d8 lb 12.4 oz (3.98 kg) M CS-LVertical EPI  LIV  ?   Name: Priscilla King?   Apgar1: 8  Apgar5: 9  ?1 IAB 1998    U    DEC  ?   Birth Comments: Elective AB  ? ? ?Past Medical History:  ?Diagnosis Date  ? Diabetes mellitus type 2, uncontrolled, without complications 1295/2841? History of polycystic ovaries   ? Hyperlipidemia LDL goal <70 06/2014  ? Kienbock's avascular necrosis of lunate, adult   ? Morbid  obesity with BMI of 40.0-44.9, adult [place]  ? ? ?Past Surgical History:  ?Procedure Laterality Date  ? CESAREAN SECTION N/A 08/31/2012  ? Procedure: CESAREAN SECTION;  Surgeon: PeMora BellmanMD;  [place] [facility];  Service: Obstetrics;  Laterality: N/A;  ? CESAREAN SECTION N/A 03/20/2017  ? Procedure: REPEAT CESAREAN SECTION;  Surgeon: ChRubie MaidMD;  Location: [facility];  Service: Obstetrics;  Laterality: N/A;  Female born @ 1500 ?Apgars: 8/9 ?Weight:10lbs 9ozs  ? COLONOSCOPY WITH PROPOFOL N/A 11/01/2021  ? Procedure: COLONOSCOPY WITH PROPOFOL;  Surgeon: AnJonathon BellowsMD;  [facility];  Service: Gastroenterology;  Laterality: N/A;  ? ? ?Family History  ?Problem Relation Age of Onset  ? Stroke Mother   ?     CVA (TIA)  ? Diabetes Mother   ? Healthy Sister   ? Healthy Daughter   ? Diabetes Maternal Grandmother   ? Hypertension Maternal Grandmother   ? Diabetes Paternal Grandmother   ? Hypertension Paternal Grandmother   ? Healthy Brother   ? Healthy Brother   ? Healthy Son   ? Breast cancer Neg Hx   ? ? ?Social History  ? ?Socioeconomic History  ? Marital status: Married  ?  Spouse name: Not on file  ? Number of children: 1  ? Years of education: Not on  file  ? Highest education level: Not on file  ?Occupational History  ? Occupation: Professor  ?  Employer: [employer]  ?  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  Employer: [employer]  ?  Employer: [employer]  ?Tobacco Use  ? Smoking status: Never  ? Smokeless tobacco: Never  ?Vaping Use  ? Vaping Use: Never used  ?Substance and Sexual Activity  ? Alcohol use: No  ? Drug use: No  ? Sexual activity: Yes  ?  Birth control/protection: None  ?  Comment: IUD removed in Oct 2017  ?Other Topics Concern  ? Not on file  ?Social History Narrative  ? Married, [place]  ? [place]  ? No exercise- knee  ? Professor of Math at [employer]  ? ?Social Determinants of Health  ? ?Financial Resource Strain: Not on file  ?Food Insecurity: Not on file  ?Transportation Needs: Not on file   ?Physical Activity: Not on file  ?Stress: Not on file  ?Social Connections: Not on file  ?Intimate Partner Violence: Not on file  ? ? ?Current Outpatient Medications on File Prior to Visit  ?Medication Sig Dispense Refill  ? amitriptyline (ELAVIL) 10 MG tablet TAKE 1 TO 2 TABLETS (10-20 MG TOTAL) BY MOUTH AT BEDTIME 180 tablet 0  ? Continuous Blood Gluc Sensor (FREESTYLE LIBRE 2 SENSOR) MISC Place 1 application onto the skin every 14 (fourteen) days. 2 each 2  ? diclofenac sodium (VOLTAREN) 1 % GEL Apply 2 g topically 4 (four) times daily. 3 Tube 5  ? Multiple Vitamin (MULTIVITAMIN ADULT PO) Take by mouth.    ? Semaglutide, 1 MG/DOSE, 4 MG/3ML SOPN Inject 1 mg as directed once a week. 3 mL 5  ? ?No current facility-administered medications on file prior to visit.  ? ? ?Allergies  ?Allergen Reactions  ? Vicodin [Hydrocodone-Acetaminophen] Swelling  ?  Tongue swelling and headache  ? ? ? ?Review of Systems ?Constitutional: negative for chills, fatigue, fevers and sweats ?Eyes: negative for irritation, redness and visual disturbance ?Ears, nose, mouth, throat, and face: negative for hearing loss, nasal congestion, snoring and tinnitus ?Respiratory: negative for asthma, cough, sputum ?Cardiovascular: negative for chest pain, dyspnea, exertional chest pressure/discomfort, irregular heart beat, palpitations and syncope ?Gastrointestinal: negative for abdominal pain, change in bowel habits, nausea and vomiting ?Genitourinary: negative for abnormal menstrual periods, genital lesions, sexual problems and vaginal discharge, dysuria and urinary incontinence ?Integument/breast: negative for breast lump, breast tenderness and nipple discharge ?Hematologic/lymphatic: negative for bleeding and easy bruising ?Musculoskeletal:negative for back pain and muscle weakness ?Neurological: negative for dizziness, headaches, vertigo and weakness ?Endocrine: negative for diabetic symptoms including polydipsia, polyuria and skin  dryness ?Allergic/Immunologic: negative for hay fever and urticaria    ? ? ?Objective:  ?Blood pressure 124/74, pulse 83, height '5\' 8"'$  (1.727 m), weight 231 lb 8 oz (105 kg).  Body mass index is 35.2 kg/m?. ? ?General Appearance:    Alert, cooperative, no distress, appears stated age. Obesity  ?Head:    Normocephalic, without obvious abnormality, atraumatic  ?Eyes:    PERRL, conjunctiva/corneas clear, EOM's intact, both eyes  ?Ears:    Normal external ear canals, both ears  ?Nose:   Nares normal, septum midline, mucosa normal, no drainage or sinus tenderness  ?Throat:   Lips, mucosa, and tongue normal; teeth and gums normal  ?Neck:   Supple, symmetrical, trachea midline, no adenopathy; thyroid: no enlargement/tenderness/nodules; no carotid bruit or JVD  ?Back:     Symmetric, no curvature, ROM normal, no CVA tenderness  ?Lungs:  Clear to auscultation bilaterally, respirations unlabored  ?Chest Wall:    No tenderness or deformity  ? Heart:    Regular rate and rhythm, S1 and S2 normal, no murmur, rub or gallop  ?Breast Exam:    No tenderness, masses, or nipple abnormality  ?Abdomen:     Soft, non-tender, bowel sounds active all four quadrants, no masses, no organomegaly.    ?Genitalia:    Pelvic:external genitalia normal, vagina without lesions, discharge, or tenderness, rectovaginal septum  normal. Cervix normal in appearance, no cervical motion tenderness, no adnexal masses or tenderness.  Uterus normal size, shape, mobile, regular contours, nontender.  ?Rectal:    Normal external sphincter.  No hemorrhoids appreciated. Internal exam not done.   ?Extremities:   Extremities normal, atraumatic, no cyanosis or edema  ?Pulses:   2+ and symmetric all extremities  ?Skin:   Skin color, texture, turgor normal, no rashes or lesions  ?Lymph nodes:   Cervical, supraclavicular, and axillary nodes normal  ?Neurologic:   CNII-XII intact, normal strength, sensation and reflexes throughout  ? ?. ? ?Labs:  ?Lab Results  ?Component  Value Date  ? WBC 7.4 09/12/2020  ? HGB 13.1 09/12/2020  ? HCT 40.5 09/12/2020  ? MCV 80.1 09/12/2020  ? PLT 283.0 09/12/2020  ? ? ?Lab Results  ?Component Value Date  ? CREATININE 0.64 09/12/2020  ? BUN 12 02/2

## 2021-12-04 NOTE — Patient Instructions (Signed)
Breast Self-Awareness ?Breast self-awareness is knowing how your breasts look and feel. You need to: ?Check your breasts on a regular basis. ?Tell your doctor about any changes. ?Become familiar with the look and feel of your breasts. This can help you catch a breast problem while it is still small and can be treated. You should do breast self-exams even if you have breast implants. ?What you need: ?A mirror. ?A well-lit room. ?A pillow or other soft object. ?How to do a breast self-exam ?Follow these steps to do a breast self-exam: ?Look for changes ? ?Take off all the clothes above your waist. ?Stand in front of a mirror in a room with good lighting. ?Put your hands down at your sides. ?Compare your breasts in the mirror. Look for any difference between them, such as: ?A difference in shape. ?A difference in size. ?Wrinkles, dips, and bumps in one breast and not the other. ?Look at each breast for changes in the skin, such as: ?Redness. ?Scaly areas. ?Skin that has gotten thicker. ?Dimpling. ?Open sores (ulcers). ?Look for changes in your nipples, such as: ?Fluid coming out of a nipple. ?Fluid around a nipple. ?Bleeding. ?Dimpling. ?Redness. ?A nipple that looks pushed in (retracted), or that has changed position. ?Feel for changes ?Lie on your back. ?Feel each breast. To do this: ?Pick a breast to feel. ?Place a pillow under the shoulder closest to that breast. Put the arm closest to that breast behind your head. ?Feel the nipple area of that breast using the hand of your other arm. Feel the area with the pads of your three middle fingers by making small circles with your fingers. Use light, medium, and firm pressure. ?Continue the overlapping circles, moving downward over the breast. Keep making circles with your fingers. Stop when you feel your ribs. ?Start making circles with your fingers again, this time going upward until you reach your collarbone. ?Then, make circles outward across your breast and into your  armpit area. ?Squeeze your nipple. Check for discharge and lumps. ?Repeat these steps to check your other breast. ?Sit or stand in the tub or shower. ?With soapy water on your skin, feel each breast the same way you did when you were lying down. ?Write down what you find ?Writing down what you find can help you remember what to tell your doctor. Write down: ?What is normal for each breast. ?Any changes you find in each breast. These include: ?The kind of changes you find. ?A tender or painful breast. ?Any lump you find. Write down its size and where it is. ?When you last had your monthly period (menstrual cycle). ?General tips ?If you are breastfeeding, the best time to check your breasts is after you feed your baby or after you use a breast pump. ?If you get monthly bleeding, the best time to check your breasts is 5-7 days after your monthly cycle ends. ?With time, you will become comfortable with the self-exam. You will also start to know if there are changes in your breasts. ?Contact a doctor if: ?You see a change in the shape or size of your breasts or nipples. ?You see a change in the skin of your breast or nipples, such as red or scaly skin. ?You have fluid coming from your nipples that is not normal. ?You find a new lump or thick area. ?You have breast pain. ?You have any concerns about your breast health. ?Summary ?Breast self-awareness includes looking for changes in your breasts and feeling for changes   within your breasts. ?You should do breast self-awareness in front of a mirror in a well-lit room. ?If you get monthly periods (menstrual cycles), the best time to check your breasts is 5-7 days after your period ends. ?Tell your doctor about any changes you see in your breasts. Changes include changes in size, changes on the skin, painful or tender breasts, or fluid from your nipples that is not normal. ?This information is not intended to replace advice given to you by your health care provider. Make sure  you discuss any questions you have with your health care provider. ?Document Revised: 05/11/2021 Document Reviewed: 05/11/2021 ?Elsevier Patient Education ? [place]. ?Preventive Care 29-58 Years Old, Female ?Preventive care refers to lifestyle choices and visits with your health care provider that can promote health and wellness. Preventive care visits are also called wellness exams. ?What can I expect for my preventive care visit? ?Counseling ?Your health care provider may ask you questions about your: ?Medical history, including: ?Past medical problems. ?Family medical history. ?Pregnancy history. ?Current health, including: ?Menstrual cycle. ?Method of birth control. ?Emotional well-being. ?Home life and relationship well-being. ?Sexual activity and sexual health. ?Lifestyle, including: ?Alcohol, nicotine or tobacco, and drug use. ?Access to firearms. ?Diet, exercise, and sleep habits. ?Work and work Statistician. ?Sunscreen use. ?Safety issues such as seatbelt and bike helmet use. ?Physical exam ?Your health care provider will check your: ?Height and weight. These may be used to calculate your BMI (body mass index). BMI is a measurement that tells if you are at a healthy weight. ?Waist circumference. This measures the distance around your waistline. This measurement also tells if you are at a healthy weight and may help predict your risk of certain diseases, such as type 2 diabetes and high blood pressure. ?Heart rate and blood pressure. ?Body temperature. ?Skin for abnormal spots. ?What immunizations do I need? ? ?Vaccines are usually given at various ages, according to a schedule. Your health care provider will recommend vaccines for you based on your age, medical history, and lifestyle or other factors, such as travel or where you work. ?What tests do I need? ?Screening ?Your health care provider may recommend screening tests for certain conditions. This may include: ?Lipid and cholesterol  levels. ?Diabetes screening. This is done by checking your blood sugar (glucose) after you have not eaten for a while (fasting). ?Pelvic exam and Pap test. ?Hepatitis B test. ?Hepatitis C test. ?HIV (human immunodeficiency virus) test. ?STI (sexually transmitted infection) testing, if you are at risk. ?Lung cancer screening. ?Colorectal cancer screening. ?Mammogram. Talk with your health care provider about when you should start having regular mammograms. This may depend on whether you have a family history of breast cancer. ?BRCA-related cancer screening. This may be done if you have a family history of breast, ovarian, tubal, or peritoneal cancers. ?Bone density scan. This is done to screen for osteoporosis. ?Talk with your health care provider about your test results, treatment options, and if necessary, the need for more tests. ?Follow these instructions at home: ?Eating and drinking ? ?Eat a diet that includes fresh fruits and vegetables, whole grains, lean protein, and low-fat dairy products. ?Take vitamin and mineral supplements as recommended by your health care provider. ?Do not drink alcohol if: ?Your health care provider tells you not to drink. ?You are pregnant, may be pregnant, or are planning to become pregnant. ?If you drink alcohol: ?Limit how much you have to 0-1 drink a day. ?Know how much alcohol is in your  drink. In the [place] one drink equals one 12 oz bottle of beer (355 mL), one 5 oz glass of wine (148 mL), or one 1? oz glass of hard liquor (44 mL). ?Lifestyle ?Brush your teeth every morning and night with fluoride toothpaste. Floss one time each day. ?Exercise for at least 30 minutes 5 or more days each week. ?Do not use any products that contain nicotine or tobacco. These products include cigarettes, chewing tobacco, and vaping devices, such as e-cigarettes. If you need help quitting, ask your health care provider. ?Do not use drugs. ?If you are sexually active, practice safe sex. Use a  condom or other form of protection to prevent STIs. ?If you do not wish to become pregnant, use a form of birth control. If you plan to become pregnant, see your health care provider for a prepregnancy visit. ?Take

## 2021-12-05 ENCOUNTER — Ambulatory Visit (INDEPENDENT_AMBULATORY_CARE_PROVIDER_SITE_OTHER): Payer: BC Managed Care – PPO | Admitting: Obstetrics and Gynecology

## 2021-12-05 ENCOUNTER — Encounter: Payer: Self-pay | Admitting: Obstetrics and Gynecology

## 2021-12-05 VITALS — BP 124/74 | HR 83 | Ht 68.0 in | Wt 231.5 lb

## 2021-12-05 DIAGNOSIS — I10 Essential (primary) hypertension: Secondary | ICD-10-CM

## 2021-12-05 DIAGNOSIS — Z1231 Encounter for screening mammogram for malignant neoplasm of breast: Secondary | ICD-10-CM

## 2021-12-05 DIAGNOSIS — E119 Type 2 diabetes mellitus without complications: Secondary | ICD-10-CM

## 2021-12-05 DIAGNOSIS — N920 Excessive and frequent menstruation with regular cycle: Secondary | ICD-10-CM

## 2021-12-05 DIAGNOSIS — Z01419 Encounter for gynecological examination (general) (routine) without abnormal findings: Secondary | ICD-10-CM | POA: Diagnosis not present

## 2021-12-05 DIAGNOSIS — E669 Obesity, unspecified: Secondary | ICD-10-CM

## 2021-12-05 DIAGNOSIS — E282 Polycystic ovarian syndrome: Secondary | ICD-10-CM

## 2021-12-05 DIAGNOSIS — R102 Pelvic and perineal pain: Secondary | ICD-10-CM

## 2021-12-05 MED ORDER — BALCOLTRA 0.1-20 MG-MCG(21) PO TABS
1.0000 | ORAL_TABLET | Freq: Every day | ORAL | 3 refills | Status: DC
Start: 2021-12-05 — End: 2022-05-28

## 2021-12-08 ENCOUNTER — Encounter: Payer: Self-pay | Admitting: Internal Medicine

## 2021-12-11 ENCOUNTER — Other Ambulatory Visit: Payer: Self-pay | Admitting: *Deleted

## 2021-12-11 DIAGNOSIS — M25551 Pain in right hip: Secondary | ICD-10-CM

## 2021-12-11 NOTE — Telephone Encounter (Signed)
I called patient, patient referred to Dr. Ninfa Linden.

## 2021-12-11 NOTE — Telephone Encounter (Signed)
I think she would be best served to see a hip specialist for treatment and would know all her options. We can refer to Dr. Ninfa Linden if she agrees we don't need a separate visit since we already discussed this last time.

## 2021-12-14 DIAGNOSIS — F411 Generalized anxiety disorder: Secondary | ICD-10-CM | POA: Diagnosis not present

## 2021-12-14 DIAGNOSIS — F331 Major depressive disorder, recurrent, moderate: Secondary | ICD-10-CM | POA: Diagnosis not present

## 2021-12-27 ENCOUNTER — Encounter: Payer: Self-pay | Admitting: Family Medicine

## 2021-12-28 DIAGNOSIS — F411 Generalized anxiety disorder: Secondary | ICD-10-CM | POA: Diagnosis not present

## 2021-12-28 DIAGNOSIS — F331 Major depressive disorder, recurrent, moderate: Secondary | ICD-10-CM | POA: Diagnosis not present

## 2022-01-01 ENCOUNTER — Ambulatory Visit: Payer: BC Managed Care – PPO | Admitting: Orthopaedic Surgery

## 2022-01-01 ENCOUNTER — Ambulatory Visit: Payer: BC Managed Care – PPO | Admitting: Internal Medicine

## 2022-01-02 MED ORDER — TIRZEPATIDE 5 MG/0.5ML ~~LOC~~ SOAJ: 5.0000 mg | SUBCUTANEOUS | 1 refills | Status: DC

## 2022-01-02 MED ORDER — SEMAGLUTIDE (2 MG/DOSE) 8 MG/3ML ~~LOC~~ SOPN: 2.0000 mg | PEN_INJECTOR | SUBCUTANEOUS | 2 refills | Status: DC

## 2022-01-02 MED ORDER — TIRZEPATIDE 2.5 MG/0.5ML ~~LOC~~ SOAJ: 2.5000 mg | SUBCUTANEOUS | 0 refills | Status: DC

## 2022-01-03 ENCOUNTER — Telehealth: Payer: BC Managed Care – PPO | Admitting: Family Medicine

## 2022-01-08 ENCOUNTER — Ambulatory Visit: Payer: BC Managed Care – PPO | Admitting: Orthopaedic Surgery

## 2022-01-08 DIAGNOSIS — F411 Generalized anxiety disorder: Secondary | ICD-10-CM | POA: Diagnosis not present

## 2022-01-08 DIAGNOSIS — F331 Major depressive disorder, recurrent, moderate: Secondary | ICD-10-CM | POA: Diagnosis not present

## 2022-01-18 ENCOUNTER — Encounter: Payer: Self-pay | Admitting: Family Medicine

## 2022-01-19 MED ORDER — SERTRALINE HCL 50 MG PO TABS: 50.0000 mg | ORAL_TABLET | Freq: Every day | ORAL | 3 refills | Status: DC

## 2022-01-29 ENCOUNTER — Telehealth: Payer: BC Managed Care – PPO | Admitting: Family Medicine

## 2022-02-01 ENCOUNTER — Encounter: Payer: BC Managed Care – PPO | Admitting: Obstetrics and Gynecology

## 2022-02-05 DIAGNOSIS — F411 Generalized anxiety disorder: Secondary | ICD-10-CM | POA: Diagnosis not present

## 2022-02-05 DIAGNOSIS — F331 Major depressive disorder, recurrent, moderate: Secondary | ICD-10-CM | POA: Diagnosis not present

## 2022-02-13 ENCOUNTER — Other Ambulatory Visit: Payer: Self-pay | Admitting: Family Medicine

## 2022-02-15 ENCOUNTER — Ambulatory Visit: Payer: BC Managed Care – PPO | Admitting: Family Medicine

## 2022-02-15 ENCOUNTER — Encounter: Payer: Self-pay | Admitting: Family Medicine

## 2022-02-15 VITALS — BP 100/74 | HR 89 | Temp 98.8°F | Ht 68.0 in | Wt 211.2 lb

## 2022-02-15 DIAGNOSIS — M1610 Unilateral primary osteoarthritis, unspecified hip: Secondary | ICD-10-CM

## 2022-02-15 DIAGNOSIS — F331 Major depressive disorder, recurrent, moderate: Secondary | ICD-10-CM

## 2022-02-15 DIAGNOSIS — M25851 Other specified joint disorders, right hip: Secondary | ICD-10-CM | POA: Diagnosis not present

## 2022-02-15 NOTE — Progress Notes (Signed)
Donatella Walski T. Lana Flaim, MD, [place] at [facility] [place], [address]  Phone: [phone]  FAX: [phone]  Priscilla King - 46 y.o. female  MRN [id number]  Date of Birth: January 06, 1976  Date: 02/15/2022  PCP: Owens Loffler, MD  Referral: Owens Loffler, MD  Chief Complaint  Patient presents with   Follow-up   Depression   Subjective:   Priscilla King is a 46 y.o. very pleasant female patient with Body mass index is 32.12 kg/m. who presents with the following:  We had exchanged MyChart messages about 1 month ago, and she had been feeling more depressed than normal.  She does have a history of having depression in the past, significant anxiety, as well as some very significant postpartum depression.  Restarted her Zoloft, now at 50 mg.  In the past she has been on significantly higher doses, but she seems to be doing better now.  She is getting up better in the morning, she has increased motivation.  She had been feeling overwhelmed and more anxious in general.  She had become anxious about losing weight, and stopped weighing herself.  Today, she is 34 pounds down compared to early April.  Doing a little bit better.  Naturally some anxiety.    Feeling overwhelmed some.   Wt Readings from Last 3 Encounters:  02/15/22 211 lb 4 oz (95.8 kg)  12/05/21 231 lb 8 oz (105 kg)  11/01/21 245 lb 8.1 oz (111.4 kg)    Lost 34 pounds  She is also still plagued a lot with right-sided hip pain.  She does have some arthritic change as well as some FAI.  Not sleeping well from hip pain.  Wakes up at night.  Taking Celebrex bid - seems to help with bid dosing.     Review of Systems is noted in the HPI, as appropriate  Objective:   BP 100/74   Pulse 89   Temp 98.8 F (37.1 C) (Oral)   Ht '5\' 8"'$  (1.727 m)   Wt 211 lb 4 oz (95.8 kg)   LMP 02/12/2022   SpO2 97%   BMI 32.12 kg/m   GEN: No acute distress;  alert,appropriate. PULM: Breathing comfortably in no respiratory distress PSYCH: Normally interactive.   Laboratory and Imaging Data:  Assessment and Plan:     ICD-10-CM   1. Moderate episode of recurrent major depressive disorder (HCC)  F33.1     2. Primary localized osteoarthritis of hip  M16.10 DG FLUORO GUIDED NEEDLE PLC ASPIRATION/INJECTION LOC    3. Femoroacetabular impingement of right hip  M25.851 DG FLUORO GUIDED NEEDLE PLC ASPIRATION/INJECTION LOC     Depression and anxiety are doing better, if not 100%.  For now, we will keep her on Zoloft 50 mg.  I would anticipate over the next few weeks this will get up to maximal effect.  I am going to go ahead and send her myself for a fluoroscopy guided hip injection.  She does have OA as well as FAI, and while she does have an upcoming appointment with hip arthroscopist, I think that this would be a reasonable first step to help her get moving and exercise better.  Orders placed today for conditions managed today: Orders Placed This Encounter  Procedures   DG FLUORO GUIDED NEEDLE PLC ASPIRATION/INJECTION LOC    Follow-up if needed: Return in about 6 months (around 08/18/2022) for General physical, bloodwork 1 week before.  [employer] One  speech-to-text software was used for transcription in this dictation.  Possible transcriptional errors can occur using Editor, commissioning.   Signed,  Maud Deed. Barak Bialecki, MD   Outpatient Encounter Medications as of 02/15/2022  Medication Sig   amitriptyline (ELAVIL) 10 MG tablet TAKE 1 TO 2 TABLETS (10-20 MG TOTAL) BY MOUTH AT BEDTIME   Continuous Blood Gluc Sensor (FREESTYLE LIBRE 2 SENSOR) MISC Place 1 application onto the skin every 14 (fourteen) days.   diclofenac sodium (VOLTAREN) 1 % GEL Apply 2 g topically 4 (four) times daily.   Levonorgest-Eth Estrad-Fe Bisg (BALCOLTRA) 0.1-20 MG-MCG(21) TABS Take 1 tablet by mouth daily.   Multiple Vitamin (MULTIVITAMIN ADULT PO) Take by mouth.    Semaglutide, 2 MG/DOSE, 8 MG/3ML SOPN Inject 2 mg as directed once a week.   sertraline (ZOLOFT) 50 MG tablet TAKE 1 TABLET BY MOUTH EVERY DAY   No facility-administered encounter medications on file as of 02/15/2022.

## 2022-02-26 DIAGNOSIS — F331 Major depressive disorder, recurrent, moderate: Secondary | ICD-10-CM | POA: Diagnosis not present

## 2022-02-26 DIAGNOSIS — F411 Generalized anxiety disorder: Secondary | ICD-10-CM | POA: Diagnosis not present

## 2022-03-01 ENCOUNTER — Ambulatory Visit
Admission: RE | Admit: 2022-03-01 | Discharge: 2022-03-01 | Disposition: A | Discharge status: Home / Self Care | Payer: BC Managed Care – PPO | Source: Ambulatory Visit | Attending: Family Medicine | Admitting: Family Medicine

## 2022-03-01 DIAGNOSIS — M1610 Unilateral primary osteoarthritis, unspecified hip: Secondary | ICD-10-CM

## 2022-03-01 DIAGNOSIS — M25551 Pain in right hip: Secondary | ICD-10-CM | POA: Diagnosis not present

## 2022-03-01 DIAGNOSIS — M25851 Other specified joint disorders, right hip: Secondary | ICD-10-CM

## 2022-03-01 MED ORDER — METHYLPREDNISOLONE ACETATE 40 MG/ML INJ SUSP (RADIOLOG
80.0000 mg | Freq: Once | INTRAMUSCULAR | Status: AC
  Administered 2022-03-01: 80 mg via INTRA_ARTICULAR

## 2022-03-01 MED ORDER — IOPAMIDOL (ISOVUE-M 200) INJECTION 41%
1.0000 mL | Freq: Once | INTRAMUSCULAR | Status: AC
  Administered 2022-03-01: 1 mL via INTRA_ARTICULAR

## 2022-03-22 ENCOUNTER — Other Ambulatory Visit: Payer: Self-pay | Admitting: Family Medicine

## 2022-03-22 NOTE — Telephone Encounter (Signed)
Last office visit 02/15/22 for MDD.  Last refilled:  Celebrex is not on current medication list.  No future appointments with PCP.

## 2022-03-23 ENCOUNTER — Encounter: Payer: Self-pay | Admitting: Family Medicine

## 2022-03-23 ENCOUNTER — Other Ambulatory Visit: Payer: Self-pay | Admitting: Family Medicine

## 2022-03-23 NOTE — Telephone Encounter (Addendum)
Last office visit 02/15/22 for MDD and hip pain.  Last refilled 01/02/22 for 3 ml with 2 refills.  Last A1c is in Care Everywhere from [place] on 07/25/21 which was 7.5%.  No future appointments with PCP.  Refill?

## 2022-04-09 DIAGNOSIS — F331 Major depressive disorder, recurrent, moderate: Secondary | ICD-10-CM | POA: Diagnosis not present

## 2022-04-09 DIAGNOSIS — F411 Generalized anxiety disorder: Secondary | ICD-10-CM | POA: Diagnosis not present

## 2022-04-13 ENCOUNTER — Ambulatory Visit
Admission: RE | Admit: 2022-04-13 | Discharge: 2022-04-13 | Disposition: A | Discharge status: Home / Self Care | Payer: BC Managed Care – PPO | Source: Ambulatory Visit | Attending: Obstetrics and Gynecology | Admitting: Obstetrics and Gynecology

## 2022-04-13 DIAGNOSIS — Z1231 Encounter for screening mammogram for malignant neoplasm of breast: Secondary | ICD-10-CM | POA: Diagnosis not present

## 2022-04-13 DIAGNOSIS — Z01419 Encounter for gynecological examination (general) (routine) without abnormal findings: Secondary | ICD-10-CM

## 2022-04-23 DIAGNOSIS — F411 Generalized anxiety disorder: Secondary | ICD-10-CM | POA: Diagnosis not present

## 2022-04-23 DIAGNOSIS — F331 Major depressive disorder, recurrent, moderate: Secondary | ICD-10-CM | POA: Diagnosis not present

## 2022-04-30 ENCOUNTER — Encounter: Payer: Self-pay | Admitting: Obstetrics and Gynecology

## 2022-05-01 ENCOUNTER — Encounter: Payer: Self-pay | Admitting: Obstetrics and Gynecology

## 2022-05-04 ENCOUNTER — Encounter: Payer: Self-pay | Admitting: Obstetrics and Gynecology

## 2022-05-07 DIAGNOSIS — F411 Generalized anxiety disorder: Secondary | ICD-10-CM | POA: Diagnosis not present

## 2022-05-07 DIAGNOSIS — F331 Major depressive disorder, recurrent, moderate: Secondary | ICD-10-CM | POA: Diagnosis not present

## 2022-05-13 ENCOUNTER — Telehealth: Payer: BC Managed Care – PPO | Admitting: Nurse Practitioner

## 2022-05-13 ENCOUNTER — Other Ambulatory Visit: Payer: Self-pay | Admitting: Family Medicine

## 2022-05-13 DIAGNOSIS — R399 Unspecified symptoms and signs involving the genitourinary system: Secondary | ICD-10-CM | POA: Diagnosis not present

## 2022-05-13 MED ORDER — NITROFURANTOIN MONOHYD MACRO 100 MG PO CAPS: 100.0000 mg | ORAL_CAPSULE | Freq: Two times a day (BID) | ORAL | 0 refills | Status: DC

## 2022-05-13 NOTE — Patient Instructions (Signed)
Priscilla King, thank you for joining Tish Men, NP for today's virtual visit.  While this provider is not your primary care provider (PCP), if your PCP is located in our provider database this encounter information will be shared with them immediately following your visit.   [place] account gives you access to today's visit and all your visits, tests, and labs performed at [facility] " click here if you don't have a [facility] account or go to [email or web address]  Consent: (Patient) Priscilla King provided verbal consent for this virtual visit at the beginning of the encounter.  Current Medications:  Current Outpatient Medications:    celecoxib (CELEBREX) 200 MG capsule, TAKE 1 CAPSULE BY MOUTH TWICE A DAY, Disp: 180 capsule, Rfl: 1   nitrofurantoin, macrocrystal-monohydrate, (MACROBID) 100 MG capsule, Take 1 capsule (100 mg total) by mouth 2 (two) times daily., Disp: 10 capsule, Rfl: 0   amitriptyline (ELAVIL) 10 MG tablet, TAKE 1 TO 2 TABLETS (10-20 MG TOTAL) BY MOUTH AT BEDTIME, Disp: 180 tablet, Rfl: 0   Continuous Blood Gluc Sensor (FREESTYLE LIBRE 2 SENSOR) MISC, Place 1 application onto the skin every 14 (fourteen) days., Disp: 2 each, Rfl: 2   diclofenac sodium (VOLTAREN) 1 % GEL, Apply 2 g topically 4 (four) times daily., Disp: 3 Tube, Rfl: 5   Levonorgest-Eth Estrad-Fe Bisg (BALCOLTRA) 0.1-20 MG-MCG(21) TABS, Take 1 tablet by mouth daily., Disp: 84 tablet, Rfl: 3   Multiple Vitamin (MULTIVITAMIN ADULT PO), Take by mouth., Disp: , Rfl:    OZEMPIC, 2 MG/DOSE, 8 MG/3ML SOPN, INJECT 2 MG AS DIRECTED ONCE A WEEK., Disp: 9 mL, Rfl: 3   sertraline (ZOLOFT) 50 MG tablet, TAKE 1 TABLET BY MOUTH EVERY DAY, Disp: 90 tablet, Rfl: 0   Medications ordered in this encounter:  Meds ordered this encounter  Medications   nitrofurantoin, macrocrystal-monohydrate, (MACROBID) 100 MG capsule    Sig: Take 1 capsule (100 mg total) by mouth 2  (two) times daily.    Dispense:  10 capsule    Refill:  0     *If you need refills on other medications prior to your next appointment, please contact your pharmacy*  Follow-Up: Call back or seek an in-person evaluation if the symptoms worsen or if the condition fails to improve as anticipated.  [place] [phone]  Other Instructions -Take medications as prescribed. -Increase fluids. -Ibuprofen or Tylenol for pain, fever, or general discomfort. -Develop a toileting schedule that will allow you to toilet at least every 2 hours. -Avoid caffeine to include tea, soda, and coffee. -If sexually active, void at least 15 to 20 minutes after sexual intercourse. -Follow up at an urgent care or local ER if symptoms do not improve after completing this medication regimen.  As discussed, you most likely will need a urine culture at that time. -Follow-up in the emergency department if you develop fever, chills, worsening abdominal pain, or other concerns.    If you have been instructed to have an in-person evaluation today at a local Urgent Care facility, please use the link below. It will take you to a list of all of our available [place] [facility], including address, phone number and hours of operation. Please do not delay care.  [facility]  If you or a family member do not have a primary care provider, use the link below to schedule a visit and establish care. When you choose a [facility] primary care  physician or advanced practice provider, you gain a long-term partner in health. Find a Primary Care Provider  Learn more about [facility]'s in-office and virtual care options: [facility]

## 2022-05-13 NOTE — Progress Notes (Signed)
Virtual Visit Consent   [name], you are scheduled for a virtual visit with a [facility] provider today. Just as with appointments in the office, your consent must be obtained to participate. Your consent will be active for this visit and any virtual visit you may have with one of our providers in the next 365 days. If you have a MyChart account, a copy of this consent can be sent to you electronically.  As this is a virtual visit, video technology does not allow for your provider to perform a traditional examination. This may limit your provider's ability to fully assess your condition. If your provider identifies any concerns that need to be evaluated in person or the need to arrange testing (such as labs, EKG, etc.), we will make arrangements to do so. Although advances in technology are sophisticated, we cannot ensure that it will always work on either your end or our end. If the connection with a video visit is poor, the visit may have to be switched to a telephone visit. With either a video or telephone visit, we are not always able to ensure that we have a secure connection.  By engaging in this virtual visit, you consent to the provision of healthcare and authorize for your insurance to be billed (if applicable) for the services provided during this visit. Depending on your insurance coverage, you may receive a charge related to this service.  I need to obtain your verbal consent now. Are you willing to proceed with your visit today? Priscilla King has provided verbal consent on 05/13/2022 for a virtual visit (video or telephone). Tish Men, NP  Date: 05/13/2022 10:24 AM  Virtual Visit via Video Note   I, [place], connected with  Priscilla King  ([id number], [birth year]) on 05/13/22 at 10:15 AM EDT by a video-enabled telemedicine application and verified that I am speaking with the correct person using two identifiers.  Location: Patient: Virtual  Visit Location Patient: Home Provider: Virtual Visit Location Provider: Home   I discussed the limitations of evaluation and management by telemedicine and the availability of in person appointments. The patient expressed understanding and agreed to proceed.    History of Present Illness: Priscilla King is a 46 y.o. who identifies as a female who was assigned female at birth, and is being seen today for urinary tract infection symptoms. Patient reports dysuria and frequency/urgency. She reports that she took an AZO home test that indicated she had a uti. She denies fever, chills, abdominal pain, flank pain, hematuria, hematuria. Her LMP was 04/23/2022. She denies history of recurrent UTI or kidney stones.   HPI: HPI  Problems:  Patient Active Problem List   Diagnosis Date Noted   Morbid obesity with BMI of 40.0-44.9, adult ([place]) 08/14/2021   Osteoarthritis, multiple sites 06/22/2021   Femoroacetabular impingement of right hip 03/29/2021   Major depression, recurrent ([place]) 09/16/2019   Hyperlipidemia LDL goal <70    Kienboeck disease of adults    Uncontrolled type 2 diabetes mellitus with hyperglycemia, without long-term current use of insulin ([place]) 06/22/2014   PCOS (polycystic ovarian syndrome) 04/15/2012   OTHER SPECIFIED FORMS OF HEARING LOSS 03/10/2009    Allergies:  Allergies  Allergen Reactions   Vicodin [Hydrocodone-Acetaminophen] Swelling    Tongue swelling and headache   Medications:  Current Outpatient Medications:    celecoxib (CELEBREX) 200 MG capsule, TAKE 1 CAPSULE BY MOUTH TWICE A DAY, Disp: 180 capsule, Rfl: 1   nitrofurantoin,  macrocrystal-monohydrate, (MACROBID) 100 MG capsule, Take 1 capsule (100 mg total) by mouth 2 (two) times daily., Disp: 10 capsule, Rfl: 0   amitriptyline (ELAVIL) 10 MG tablet, TAKE 1 TO 2 TABLETS (10-20 MG TOTAL) BY MOUTH AT BEDTIME, Disp: 180 tablet, Rfl: 0   Continuous Blood Gluc Sensor (FREESTYLE LIBRE 2 SENSOR) MISC, Place 1 application  onto the skin every 14 (fourteen) days., Disp: 2 each, Rfl: 2   diclofenac sodium (VOLTAREN) 1 % GEL, Apply 2 g topically 4 (four) times daily., Disp: 3 Tube, Rfl: 5   Levonorgest-Eth Estrad-Fe Bisg (BALCOLTRA) 0.1-20 MG-MCG(21) TABS, Take 1 tablet by mouth daily., Disp: 84 tablet, Rfl: 3   Multiple Vitamin (MULTIVITAMIN ADULT PO), Take by mouth., Disp: , Rfl:    OZEMPIC, 2 MG/DOSE, 8 MG/3ML SOPN, INJECT 2 MG AS DIRECTED ONCE A WEEK., Disp: 9 mL, Rfl: 3   sertraline (ZOLOFT) 50 MG tablet, TAKE 1 TABLET BY MOUTH EVERY DAY, Disp: 90 tablet, Rfl: 0  Observations/Objective: Patient is well-developed, well-nourished in no acute distress.  Resting comfortably at home.  Head is normocephalic, atraumatic.  No labored breathing.  Speech is clear and coherent with logical content.  Patient is alert and oriented at baseline.    Assessment and Plan: 1. Urinary tract infection symptoms - nitrofurantoin, macrocrystal-monohydrate, (MACROBID) 100 MG capsule; Take 1 capsule (100 mg total) by mouth 2 (two) times daily.  Dispense: 10 capsule; Refill: 0  Urinary symptoms with UTI. Will start Macrobid '100mg'$  BID x 5 days. Supportive care recommendations were provided to the patient along with strict indications to follow-up with in-person visit. Patient verbalizes understanding, all questions were answered.   Follow Up Instructions: I discussed the assessment and treatment plan with the patient. The patient was provided an opportunity to ask questions and all were answered. The patient agreed with the plan and demonstrated an understanding of the instructions.  A copy of instructions were sent to the patient via MyChart unless otherwise noted below.     The patient was advised to call back or seek an in-person evaluation if the symptoms worsen or if the condition fails to improve as anticipated.  Time:  I spent 10 minutes with the patient via telehealth technology discussing the above problems/concerns.     Tish Men, NP

## 2022-05-21 DIAGNOSIS — F411 Generalized anxiety disorder: Secondary | ICD-10-CM | POA: Diagnosis not present

## 2022-05-21 DIAGNOSIS — F331 Major depressive disorder, recurrent, moderate: Secondary | ICD-10-CM | POA: Diagnosis not present

## 2022-05-28 ENCOUNTER — Encounter (INDEPENDENT_AMBULATORY_CARE_PROVIDER_SITE_OTHER): Payer: BC Managed Care – PPO

## 2022-05-28 NOTE — Progress Notes (Deleted)
SUBJECTIVE: Priscilla King is a 46 y.o. female who complains of urinary frequency and discomfort x 14 days, without flank pain, fever, chills, or abnormal vaginal discharge or bleeding. She had a televist about 2 weeks ago, and she was prescribed Macrobid. She completed the course of Macrobid, then her cycle started, after her cycle she noticed the urinary frequency and discomfort again.   OBJECTIVE: Appears well, in no apparent distress.  Vital signs are normal. The abdomen is soft without tenderness, guarding, mass, rebound or organomegaly. No CVA tenderness or inguinal adenopathy noted. Urine dipstick was not done, because patient had taken AZO before she got her and we were unable to send it out for culture as well.   Blood pressure (!) 127/90, pulse 89, temperature 99.3 F (37.4 C), temperature source Oral, resp. rate 16, height '5\' 8"'$  (1.727 m), weight 212 lb 4.8 oz (96.3 kg).   ASSESSMENT: UTI uncomplicated without evidence of pyelonephritis  PLAN: Treatment per orders -    Increase water intake and add cranberry juice. Take extra strength Tylenol per label instructions or Ibuprofen 800 mg every 8 hours if you are not pregnant. She will return to clinic in 24-48 hours without AZO for testing.

## 2022-05-30 ENCOUNTER — Ambulatory Visit (INDEPENDENT_AMBULATORY_CARE_PROVIDER_SITE_OTHER): Payer: BC Managed Care – PPO

## 2022-05-30 ENCOUNTER — Other Ambulatory Visit: Payer: Self-pay

## 2022-05-30 VITALS — BP 143/84 | HR 82 | Wt 212.0 lb

## 2022-05-30 DIAGNOSIS — R350 Frequency of micturition: Secondary | ICD-10-CM | POA: Diagnosis not present

## 2022-05-30 LAB — POCT URINALYSIS DIPSTICK
Bilirubin, UA: NEGATIVE
Blood, UA: NEGATIVE
Glucose, UA: NEGATIVE
Ketones, UA: NEGATIVE
Leukocytes, UA: NEGATIVE
Nitrite, UA: NEGATIVE
Protein, UA: NEGATIVE
Spec Grav, UA: 1.015 (ref 1.010–1.025)
Urobilinogen, UA: 1 E.U./dL
pH, UA: 6 (ref 5.0–8.0)

## 2022-05-30 MED ORDER — NITROFURANTOIN MONOHYD MACRO 100 MG PO CAPS: 100.0000 mg | ORAL_CAPSULE | Freq: Two times a day (BID) | ORAL | 1 refills | Status: DC

## 2022-05-30 NOTE — Progress Notes (Signed)
Patient returns today after being seen on Monday was told to come back 24/48 hours after not taking AZO. Patient stated she was on an antibiotic then started her period and her discomfort came back. Still having the frequency urination, burning feeling. Sending out a urine culture today and another round of Macrobid until we get the culture back.

## 2022-06-01 LAB — URINE CULTURE

## 2022-06-06 DIAGNOSIS — F331 Major depressive disorder, recurrent, moderate: Secondary | ICD-10-CM | POA: Diagnosis not present

## 2022-06-06 DIAGNOSIS — F411 Generalized anxiety disorder: Secondary | ICD-10-CM | POA: Diagnosis not present

## 2022-06-22 ENCOUNTER — Telehealth: Payer: Self-pay | Admitting: Family Medicine

## 2022-06-22 MED ORDER — OZEMPIC (2 MG/DOSE) 8 MG/3ML ~~LOC~~ SOPN: PEN_INJECTOR | SUBCUTANEOUS | 0 refills | Status: DC

## 2022-06-22 NOTE — Telephone Encounter (Signed)
Rx sent to [facility] as requested.  Left message for Priscilla King letting her know that this had been done.

## 2022-06-22 NOTE — Telephone Encounter (Signed)
Pt called stating she was prescribed a 90 day supply for OZEMPIC, 2 MG/DOSE, 8 MG/3ML SOPN & [facility] on [address] is out of stock. Pt stated [facility] on [address] has meds but not enough for a 90 day supply, only 30 day. Pt was told by [facility] that a new prescription would have to be sent in for 30 days. Call back # [phone]

## 2022-06-25 ENCOUNTER — Encounter: Payer: Self-pay | Admitting: Family Medicine

## 2022-06-25 DIAGNOSIS — M25851 Other specified joint disorders, right hip: Secondary | ICD-10-CM

## 2022-06-25 DIAGNOSIS — M1610 Unilateral primary osteoarthritis, unspecified hip: Secondary | ICD-10-CM

## 2022-07-02 DIAGNOSIS — F411 Generalized anxiety disorder: Secondary | ICD-10-CM | POA: Diagnosis not present

## 2022-07-02 DIAGNOSIS — F331 Major depressive disorder, recurrent, moderate: Secondary | ICD-10-CM | POA: Diagnosis not present

## 2022-07-12 ENCOUNTER — Ambulatory Visit
Admission: RE | Admit: 2022-07-12 | Discharge: 2022-07-12 | Disposition: A | Discharge status: Home / Self Care | Payer: BC Managed Care – PPO | Source: Ambulatory Visit | Attending: Family Medicine | Admitting: Family Medicine

## 2022-07-12 DIAGNOSIS — M1610 Unilateral primary osteoarthritis, unspecified hip: Secondary | ICD-10-CM

## 2022-07-12 DIAGNOSIS — M25551 Pain in right hip: Secondary | ICD-10-CM | POA: Diagnosis not present

## 2022-07-12 DIAGNOSIS — M25851 Other specified joint disorders, right hip: Secondary | ICD-10-CM

## 2022-07-12 MED ORDER — IOPAMIDOL (ISOVUE-M 200) INJECTION 41%
1.0000 mL | Freq: Once | INTRAMUSCULAR | Status: AC
  Administered 2022-07-12: 1 mL via INTRA_ARTICULAR

## 2022-07-12 MED ORDER — METHYLPREDNISOLONE ACETATE 40 MG/ML INJ SUSP (RADIOLOG
80.0000 mg | Freq: Once | INTRAMUSCULAR | Status: AC
  Administered 2022-07-12: 80 mg via INTRA_ARTICULAR

## 2022-07-26 DIAGNOSIS — F411 Generalized anxiety disorder: Secondary | ICD-10-CM | POA: Diagnosis not present

## 2022-07-26 DIAGNOSIS — F331 Major depressive disorder, recurrent, moderate: Secondary | ICD-10-CM | POA: Diagnosis not present

## 2022-07-30 ENCOUNTER — Encounter: Payer: Self-pay | Admitting: Podiatry

## 2022-07-30 ENCOUNTER — Ambulatory Visit: Payer: BC Managed Care – PPO | Admitting: Podiatry

## 2022-07-30 DIAGNOSIS — Q828 Other specified congenital malformations of skin: Secondary | ICD-10-CM | POA: Diagnosis not present

## 2022-07-30 DIAGNOSIS — E119 Type 2 diabetes mellitus without complications: Secondary | ICD-10-CM

## 2022-07-30 NOTE — Progress Notes (Signed)
  Subjective:  Patient ID: Priscilla King, female    DOB: 07-May-1976,  MRN: [id number]  Chief Complaint  Patient presents with   Diabetes    Diabetic foot care / exam   Callouses    Small callus right great toe    47 y.o. female presents with the above complaint. History confirmed with patient.  The calluses been there for some time is becoming increasingly painful.  She says her A1c is good and is around 7%.  Denies numbness tingling burning pain in the feet.  Objective:  Physical Exam: warm, good capillary refill, no trophic changes or ulcerative lesions, normal DP and PT pulses, normal sensory exam, and porokeratosis plantar lateral great toe.  Assessment:   1. Porokeratosis   2. Type 2 diabetes mellitus without complication, without long-term current use of insulin ([place])   3. Encounter for diabetic foot exam [facility])      Plan:  Patient was evaluated and treated and all questions answered.  Patient educated on diabetes. Discussed proper diabetic foot care and discussed risks and complications of disease. Educated patient in depth on reasons to return to the office immediately should he/she discover anything concerning or new on the feet. All questions answered. Discussed proper shoes as well.   All symptomatic hyperkeratoses were safely debrided with a sterile #15 blade to patient's level of comfort without incident. We discussed preventative and palliative care of these lesions including supportive and accommodative shoegear, padding, prefabricated and custom molded accommodative orthoses, use of a pumice stone and lotions/creams daily.  She has salicylic acid pads at home and will use this to get rid of the lesion   Return if symptoms worsen or fail to improve.

## 2022-08-02 NOTE — Progress Notes (Deleted)
This encounter was created in error - please disregard.

## 2022-08-07 DIAGNOSIS — F331 Major depressive disorder, recurrent, moderate: Secondary | ICD-10-CM | POA: Diagnosis not present

## 2022-08-07 DIAGNOSIS — F411 Generalized anxiety disorder: Secondary | ICD-10-CM | POA: Diagnosis not present

## 2022-08-09 ENCOUNTER — Other Ambulatory Visit: Payer: Self-pay | Admitting: Family Medicine

## 2022-08-20 DIAGNOSIS — F411 Generalized anxiety disorder: Secondary | ICD-10-CM | POA: Diagnosis not present

## 2022-08-20 DIAGNOSIS — F331 Major depressive disorder, recurrent, moderate: Secondary | ICD-10-CM | POA: Diagnosis not present

## 2022-08-26 NOTE — Progress Notes (Unsigned)
    Lander Eslick T. Priscilla Dawn, MD, [place] at [facility] [place], [address]  Phone: [phone]  FAX: [phone]  Priscilla King - 47 y.o. female  MRN [id number]  Date of Birth: 09-23-1975  Date: 08/27/2022  PCP: Owens Loffler, MD  Referral: Owens Loffler, MD  No chief complaint on file.  Subjective:   Priscilla King is a 47 y.o. very pleasant female patient with There is no height or weight on file to calculate BMI. who presents with the following:  Pleasant, very well-known patient with [facility] who presents with some chest discomfort.  She has also not had diabetic follow-up in 6 months.  Chest pain.  Diabetes Mellitus: Tolerating Medications: yes Compliance with diet: fair, There is no height or weight on file to calculate BMI. Exercise: minimal / intermittent Avg blood sugars at home: not checking Foot problems: none Hypoglycemia: none No nausea, vomitting, blurred vision, polyuria.  Lab Results  Component Value Date   HGBA1C 7.5 07/25/2021   HGBA1C 10.0 (H) 09/12/2020   HGBA1C 9.2 (H) 08/12/2019   Lab Results  Component Value Date   MICROALBUR 1.7 09/12/2020   LDLCALC 158 (H) 09/12/2020   CREATININE 0.64 09/12/2020    Wt Readings from Last 3 Encounters:  05/30/22 212 lb (96.2 kg)  05/28/22 212 lb 4.8 oz (96.3 kg)  02/15/22 211 lb 4 oz (95.8 kg)     Review of Systems is noted in the HPI, as appropriate  Objective:   There were no vitals taken for this visit.  GEN: No acute distress; alert,appropriate. PULM: Breathing comfortably in no respiratory distress PSYCH: Normally interactive.   Laboratory and Imaging Data:  Assessment and Plan:   ***

## 2022-08-27 ENCOUNTER — Encounter: Payer: Self-pay | Admitting: Family Medicine

## 2022-08-27 ENCOUNTER — Ambulatory Visit: Payer: BC Managed Care – PPO | Admitting: Family Medicine

## 2022-08-27 VITALS — BP 90/60 | HR 88 | Temp 97.6°F | Ht 68.0 in | Wt 201.2 lb

## 2022-08-27 DIAGNOSIS — E782 Mixed hyperlipidemia: Secondary | ICD-10-CM

## 2022-08-27 DIAGNOSIS — R079 Chest pain, unspecified: Secondary | ICD-10-CM

## 2022-08-27 DIAGNOSIS — E1165 Type 2 diabetes mellitus with hyperglycemia: Secondary | ICD-10-CM | POA: Diagnosis not present

## 2022-08-27 DIAGNOSIS — R5383 Other fatigue: Secondary | ICD-10-CM | POA: Diagnosis not present

## 2022-08-27 DIAGNOSIS — Z79899 Other long term (current) drug therapy: Secondary | ICD-10-CM

## 2022-08-27 LAB — HEPATIC FUNCTION PANEL
ALT: 11 U/L (ref 0–35)
AST: 15 U/L (ref 0–37)
Albumin: 4.2 g/dL (ref 3.5–5.2)
Alkaline Phosphatase: 67 U/L (ref 39–117)
Bilirubin, Direct: 0.1 mg/dL (ref 0.0–0.3)
Total Bilirubin: 0.4 mg/dL (ref 0.2–1.2)
Total Protein: 7.2 g/dL (ref 6.0–8.3)

## 2022-08-27 LAB — BASIC METABOLIC PANEL
BUN: 15 mg/dL (ref 6–23)
CO2: 21 mEq/L (ref 19–32)
Calcium: 9.1 mg/dL (ref 8.4–10.5)
Chloride: 106 mEq/L (ref 96–112)
Creatinine, Ser: 0.74 mg/dL (ref 0.40–1.20)
GFR: 96.95 mL/min (ref >60.00)
Glucose, Bld: 90 mg/dL (ref 70–99)
Potassium: 4 mEq/L (ref 3.5–5.1)
Sodium: 136 mEq/L (ref 135–145)

## 2022-08-27 LAB — MICROALBUMIN / CREATININE URINE RATIO
Creatinine,U: 429.1 mg/dL
Microalb Creat Ratio: 0.9 mg/g (ref 0.0–30.0)
Microalb, Ur: 3.7 mg/dL — ABNORMAL HIGH (ref 0.0–1.9)

## 2022-08-27 LAB — LIPID PANEL
Cholesterol: 215 mg/dL — ABNORMAL HIGH (ref 0–200)
HDL: 53 mg/dL (ref >39.00)
LDL Cholesterol: 150 mg/dL — ABNORMAL HIGH (ref 0–99)
NonHDL: 161.74
Total CHOL/HDL Ratio: 4
Triglycerides: 61 mg/dL (ref 0.0–149.0)
VLDL: 12.2 mg/dL (ref 0.0–40.0)

## 2022-08-27 LAB — CBC WITH DIFFERENTIAL/PLATELET
Basophils Absolute: 0.1 10*3/uL (ref 0.0–0.1)
Basophils Relative: 2 % (ref 0.0–3.0)
Eosinophils Absolute: 0.1 10*3/uL (ref 0.0–0.7)
Eosinophils Relative: 1.1 % (ref 0.0–5.0)
HCT: 27.2 % — ABNORMAL LOW (ref 36.0–46.0)
Hemoglobin: 8.4 g/dL — ABNORMAL LOW (ref 12.0–15.0)
Lymphocytes Relative: 30.6 % (ref 12.0–46.0)
Lymphs Abs: 1.8 10*3/uL (ref 0.7–4.0)
MCHC: 30.9 g/dL (ref 30.0–36.0)
MCV: 63 fl — ABNORMAL LOW (ref 78.0–100.0)
Monocytes Absolute: 0.5 10*3/uL (ref 0.1–1.0)
Monocytes Relative: 8.4 % (ref 3.0–12.0)
Neutro Abs: 3.4 10*3/uL (ref 1.4–7.7)
Neutrophils Relative %: 57.9 % (ref 43.0–77.0)
Platelets: 336 10*3/uL (ref 150.0–400.0)
RBC: 4.31 Mil/uL (ref 3.87–5.11)
RDW: 18.6 % — ABNORMAL HIGH (ref 11.5–15.5)
WBC: 5.9 10*3/uL (ref 4.0–10.5)

## 2022-08-27 LAB — TSH: TSH: 1.26 u[IU]/mL (ref 0.35–5.50)

## 2022-08-27 LAB — POCT GLYCOSYLATED HEMOGLOBIN (HGB A1C): Hemoglobin A1C: 5.8 % — AB (ref 4.0–5.6)

## 2022-08-28 ENCOUNTER — Encounter: Payer: Self-pay | Admitting: Family Medicine

## 2022-08-29 NOTE — Addendum Note (Signed)
Addended by: Owens Loffler on: 08/29/2022 03:06 PM   Modules accepted: Orders

## 2022-09-02 MED ORDER — ROSUVASTATIN CALCIUM 20 MG PO TABS: 20.0000 mg | ORAL_TABLET | Freq: Every day | ORAL | 3 refills | Status: DC

## 2022-09-17 DIAGNOSIS — F331 Major depressive disorder, recurrent, moderate: Secondary | ICD-10-CM | POA: Diagnosis not present

## 2022-09-17 DIAGNOSIS — F411 Generalized anxiety disorder: Secondary | ICD-10-CM | POA: Diagnosis not present

## 2022-09-21 ENCOUNTER — Other Ambulatory Visit: Payer: Self-pay

## 2022-09-21 ENCOUNTER — Ambulatory Visit: Payer: BC Managed Care – PPO | Attending: Cardiology | Admitting: Cardiology

## 2022-09-21 ENCOUNTER — Encounter: Payer: Self-pay | Admitting: Cardiology

## 2022-09-21 VITALS — BP 108/78 | HR 94 | Ht 66.0 in | Wt 202.0 lb

## 2022-09-21 DIAGNOSIS — R072 Precordial pain: Secondary | ICD-10-CM | POA: Diagnosis not present

## 2022-09-21 DIAGNOSIS — E7800 Pure hypercholesterolemia, unspecified: Secondary | ICD-10-CM | POA: Diagnosis not present

## 2022-09-21 DIAGNOSIS — R079 Chest pain, unspecified: Secondary | ICD-10-CM

## 2022-09-21 MED ORDER — METOPROLOL TARTRATE 50 MG PO TABS
50.0000 mg | ORAL_TABLET | Freq: Once | ORAL | 0 refills | Status: DC
  Filled 2022-09-21: qty 1, 1d supply, fill #0

## 2022-09-21 MED ORDER — IVABRADINE HCL 7.5 MG PO TABS
15.0000 mg | ORAL_TABLET | Freq: Once | ORAL | 0 refills | Status: AC
  Filled 2022-09-21: qty 2, 1d supply, fill #0

## 2022-09-21 NOTE — Patient Instructions (Signed)
Medication Instructions:  Your physician recommends that you continue on your current medications as directed. Please refer to the Current Medication list given to you today.  *If you need a refill on your cardiac medications before your next appointment, please call your pharmacy*   Lab Work: Your physician recommends that you return for lab work to be completed within in 30 days of coronary CT: [place] Entrance at [facility] 1st desk on the right to check in (REGISTRATION)  Lab hours: Monday- Friday (7:30 am- 5:30 pm)  If you have labs (blood work) drawn today and your tests are completely normal, you will receive your results only by: MyChart Message (if you have MyChart) OR A paper copy in the mail If you have any lab test that is abnormal or we need to change your treatment, we will call you to review the results.   Testing/Procedures: Your physician has requested that you have an echocardiogram. Echocardiography is a painless test that uses sound waves to create images of your heart. It provides your doctor with information about the size and shape of your heart and how well your heart's chambers and valves are working. This procedure takes approximately one hour. There are no restrictions for this procedure. Please do NOT wear cologne, perfume, aftershave, or lotions (deodorant is allowed). Please arrive 15 minutes prior to your appointment time.     Your cardiac CT will be scheduled at one of the below locations:   [facility] [place], [place] [address] [phone]    Scheduled at [facility], please arrive 15 mins early for check-in and test prep.  Please follow these instructions carefully (unless otherwise directed):  On the Night Before the Test: Be sure to Drink plenty of water. Do not consume any caffeinated/decaffeinated beverages or chocolate 12 hours prior to your test. Do not take any  antihistamines 12 hours prior to your test.  On the Day of the Test: Drink plenty of water until 1 hour prior to the test. Do not eat any food 1 hour prior to test. You may take your regular medications prior to the test.  Take metoprolol 50 mg (Lopressor) and ivabradine (CORLANOR) 7.5 mg (15 mg) two hours prior to test. If you take Furosemide/Hydrochlorothiazide/Spironolactone, please HOLD on the morning of the test. FEMALES- please wear underwire-free bra if available, avoid dresses & tight clothing       After the Test: Drink plenty of water. After receiving IV contrast, you may experience a mild flushed feeling. This is normal. On occasion, you may experience a mild rash up to 24 hours after the test. This is not dangerous. If this occurs, you can take Benadryl 25 mg and increase your fluid intake. If you experience trouble breathing, this can be serious. If it is severe call 911 IMMEDIATELY. If it is mild, please call our office. If you take any of these medications: Glipizide/Metformin, Avandament, Glucavance, please do not take 48 hours after completing test unless otherwise instructed.  We will call to schedule your test 2-4 weeks out understanding that some insurance companies will need an authorization prior to the service being performed.   For non-scheduling related questions, please contact the cardiac imaging nurse navigator should you have any questions/concerns: Marchia Bond, Cardiac Imaging Nurse Navigator Gordy Clement, Cardiac Imaging Nurse Navigator [facility] Direct Office Dial: [phone]   For scheduling needs, including cancellations and rescheduling, please call [place],  [phone].  Follow-Up: At [facility], you and your health needs are our priority.  As part of our continuing mission to provide you with exceptional heart care, we have created designated Provider Care Teams.  These Care Teams include your primary  Cardiologist (physician) and Advanced Practice Providers (APPs -  Physician Assistants and Nurse Practitioners) who all work together to provide you with the care you need, when you need it.  We recommend signing up for the patient portal called "MyChart".  Sign up information is provided on this After Visit Summary.  MyChart is used to connect with patients for Virtual Visits (Telemedicine).  Patients are able to view lab/test results, encounter notes, upcoming appointments, etc.  Non-urgent messages can be sent to your provider as well.   To learn more about what you can do with MyChart, go to [email or web address].    Your next appointment:   Follow-up after testing completed  Provider:   You may see Kate Sable, MD or one of the following Advanced Practice Providers on your designated Care Team:   Murray Hodgkins, NP Christell Faith, PA-C Cadence Kathlen Mody, PA-C Gerrie Nordmann, NP   Other Instructions -None

## 2022-09-21 NOTE — Progress Notes (Signed)
Cardiology Office Note:    Date:  09/21/2022   ID:  [name], DOB Mar 07, 1976, MRN [id number]  PCP:  Owens Loffler, MD   [place] Providers Cardiologist:  Kate Sable, MD     Referring MD: Owens Loffler, MD   Chief Complaint  Patient presents with   New Patient (Initial Visit)    Chest pain, some right arm pain, SOB, No Hx    Priscilla King is a 47 y.o. female who is being seen today for the evaluation of chest pain at the request of Copland, Frederico Hamman, MD.   History of Present Illness:    Priscilla King is a 46 y.o. female with a hx of hyperlipidemia, diabetes who presents due to chest pain.  States having chest pressure ongoing over the past 2 months.  Symptoms occur randomly not associated with exertion.  Describes discomfort as tightness over her chest.  Denies any family history of heart attacks, mother had a stroke in her 68s.  Recently started on Crestor for hyperlipidemia.  Past Medical History:  Diagnosis Date   Diabetes mellitus type 2, uncontrolled, without complications [id number]   History of polycystic ovaries    Hyperlipidemia LDL goal <70 06/2014   Kienbock's avascular necrosis of lunate, adult    Morbid obesity with BMI of 40.0-44.9, adult ([place]) 08/14/2021    Past Surgical History:  Procedure Laterality Date   CESAREAN SECTION N/A 08/31/2012   Procedure: CESAREAN SECTION;  Surgeon: Mora Bellman, MD;  Location: [facility];  Service: Obstetrics;  Laterality: N/A;   CESAREAN SECTION N/A 03/20/2017   Procedure: REPEAT CESAREAN SECTION;  Surgeon: Rubie Maid, MD;  Location: [facility];  Service: Obstetrics;  Laterality: N/A;  Female born @ 1500 Apgars: 8/9 Weight:10lbs 9ozs   COLONOSCOPY WITH PROPOFOL N/A 11/01/2021   Procedure: COLONOSCOPY WITH PROPOFOL;  Surgeon: Jonathon Bellows, MD;  Location: [facility]COPY;  Service: Gastroenterology;  Laterality: N/A;    Current Medications: Current Meds  Medication Sig   amitriptyline (ELAVIL) 10  MG tablet TAKE 1 TO 2 TABLETS (10-20 MG TOTAL) BY MOUTH AT BEDTIME   celecoxib (CELEBREX) 200 MG capsule TAKE 1 CAPSULE BY MOUTH TWICE A DAY   Continuous Blood Gluc Sensor (FREESTYLE LIBRE 2 SENSOR) MISC Place 1 application onto the skin every 14 (fourteen) days.   diclofenac sodium (VOLTAREN) 1 % GEL Apply 2 g topically 4 (four) times daily.   ivabradine (CORLANOR) 7.5 MG TABS tablet Take 2 tablets (15 mg total) by mouth once for 1 dose. Take 2 tablets (15 mg total) by mouth 2 hours prior to CT   metoprolol tartrate (LOPRESSOR) 50 MG tablet Take 1 tablet (50 mg total) by mouth once for 1 dose. Take 1 tablet (50 mg total) by mouth 2 hours prior to CT   Multiple Vitamin (MULTIVITAMIN ADULT PO) Take by mouth.   rosuvastatin (CRESTOR) 20 MG tablet Take 1 tablet (20 mg total) by mouth daily.   Semaglutide, 2 MG/DOSE, (OZEMPIC, 2 MG/DOSE,) 8 MG/3ML SOPN INJECT 2 MG AS DIRECTED ONCE A WEEK.   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    Employer: [employer]    Employer: [employer]  Tobacco Use   Smoking status: Never    Passive exposure: Past   Smokeless tobacco: Never  Vaping Use   Vaping Use: Never used  Substance and Sexual Activity   Alcohol use: No   Drug use: No   Sexual activity: Yes    Birth control/protection: None    Comment: IUD removed in Oct 2017  Other Topics Concern   Not on file  Social History Narrative   Married, Ritson   [place]   No exercise- knee   Professor of Conservation officer, nature at [employer] Strain: Not on [employer] Insecurity: Not on file  Transportation  Needs: Not on file  Physical Activity: Not on file  Stress: Not on file  Social Connections: Not on file     Family History: The patient's family history includes Diabetes in her maternal grandmother, mother, and paternal grandmother; Healthy in her brother, brother, daughter, sister, and son; Hypertension in her maternal grandmother and paternal grandmother; Stroke in her mother. There is no history of Breast cancer.  ROS:   Please see the history of present illness.     All other systems reviewed and are negative.  EKGs/Labs/Other Studies Reviewed:    The following studies were reviewed today:   EKG:  EKG is  ordered today.  The ekg ordered today demonstrates sinus rhythm, heart rate 94  Recent Labs: 08/27/2022: ALT 11; BUN 15; Creatinine, Ser 0.74; Hemoglobin 8.4 Repeated and verified X2.; Platelets 336.0; Potassium 4.0; Sodium 136; TSH 1.26  Recent Lipid Panel    Component Value Date/Time   CHOL 215 (H) 08/27/2022 0911   TRIG 61.0 08/27/2022 0911   HDL 53.00 08/27/2022 0911   CHOLHDL 4 08/27/2022 0911   VLDL 12.2 08/27/2022 0911   LDLCALC 150 (H) 08/27/2022 0911     Risk Assessment/Calculations:             Physical Exam:    VS:  BP 108/78 (BP Location: Right Arm)   Pulse 94   Ht '5\' 6"'$  (1.676 m)   Wt 202 lb (91.6 kg)   LMP 08/04/2022   SpO2 99%   BMI 32.60 kg/m     Wt Readings from Last 3 Encounters:  09/21/22 202 lb (91.6 kg)  08/27/22 201 lb 4 oz (91.3 kg)  05/30/22 212 lb (96.2 kg)     GEN:  Well nourished, well developed in no acute distress HEENT: Normal NECK: No JVD; No carotid bruits CARDIAC: RRR, no murmurs, rubs, gallops RESPIRATORY:  Clear to auscultation without rales, wheezing or rhonchi  ABDOMEN: Soft, non-tender, non-distended MUSCULOSKELETAL:  No edema; No deformity  SKIN: Warm and dry NEUROLOGIC:  Alert and oriented x 3 PSYCHIATRIC:  Normal affect   ASSESSMENT:    1. Precordial pain   2. Pure hypercholesterolemia   3. Chest  pain, unspecified type    PLAN:    In order of problems listed above:  Chest pain, risk factors hyperlipidemia, diabetes get echo, get coronary CTA. Hyperlipidemia, continue Crestor 20 mg daily.  Follow-up after echo and coronary CT.      Medication Adjustments/Labs and Tests Ordered: Current medicines are reviewed at length with the patient today.  Concerns regarding medicines are outlined above.  Orders Placed This Encounter  Procedures   CT CORONARY MORPH W/CTA COR W/SCORE W/CA W/CM &/OR WO/CM   CBC  Basic Metabolic Panel (BMET)   EKG 12-Lead   ECHOCARDIOGRAM COMPLETE   Meds ordered this encounter  Medications   ivabradine (CORLANOR) 7.5 MG TABS tablet    Sig: Take 2 tablets (15 mg total) by mouth once for 1 dose. Take 2 tablets (15 mg total) by mouth 2 hours prior to CT    Dispense:  2 tablet    Refill:  0    use discounted cash price   metoprolol tartrate (LOPRESSOR) 50 MG tablet    Sig: Take 1 tablet (50 mg total) by mouth once for 1 dose. Take 1 tablet (50 mg total) by mouth 2 hours prior to CT    Dispense:  1 tablet    Refill:  0    Patient Instructions  Medication Instructions:  Your physician recommends that you continue on your current medications as directed. Please refer to the Current Medication list given to you today.  *If you need a refill on your cardiac medications before your next appointment, please call your pharmacy*   Lab Work: Your physician recommends that you return for lab work to be completed within in 30 days of coronary CT: [place] Entrance at [facility] 1st desk on the right to check in (REGISTRATION)  Lab hours: Monday- Friday (7:30 am- 5:30 pm)  If you have labs (blood work) drawn today and your tests are completely normal, you will receive your results only by: MyChart Message (if you have MyChart) OR A paper copy in the mail If you have any lab test that is abnormal or we need to change your treatment, we will call you to  review the results.   Testing/Procedures: Your physician has requested that you have an echocardiogram. Echocardiography is a painless test that uses sound waves to create images of your heart. It provides your doctor with information about the size and shape of your heart and how well your heart's chambers and valves are working. This procedure takes approximately one hour. There are no restrictions for this procedure. Please do NOT wear cologne, perfume, aftershave, or lotions (deodorant is allowed). Please arrive 15 minutes prior to your appointment time.     Your cardiac CT will be scheduled at one of the below locations:   [facility] [place], [place] [address] [phone]    Scheduled at [facility], please arrive 15 mins early for check-in and test prep.  Please follow these instructions carefully (unless otherwise directed):  On the Night Before the Test: Be sure to Drink plenty of water. Do not consume any caffeinated/decaffeinated beverages or chocolate 12 hours prior to your test. Do not take any antihistamines 12 hours prior to your test.  On the Day of the Test: Drink plenty of water until 1 hour prior to the test. Do not eat any food 1 hour prior to test. You may take your regular medications prior to the test.  Take metoprolol 50 mg (Lopressor) and ivabradine (CORLANOR) 7.5 mg (15 mg) two hours prior to test. If you take Furosemide/Hydrochlorothiazide/Spironolactone, please HOLD on the morning of the test. FEMALES- please wear underwire-free bra if available, avoid dresses & tight clothing       After the Test: Drink plenty of water. After receiving IV contrast, you may experience a mild flushed feeling. This is normal. On occasion, you may experience a mild rash up to 24 hours after the test. This is not dangerous. If this occurs, you can  take Benadryl 25 mg and increase your fluid intake. If you  experience trouble breathing, this can be serious. If it is severe call 911 IMMEDIATELY. If it is mild, please call our office. If you take any of these medications: Glipizide/Metformin, Avandament, Glucavance, please do not take 48 hours after completing test unless otherwise instructed.  We will call to schedule your test 2-4 weeks out understanding that some insurance companies will need an authorization prior to the service being performed.   For non-scheduling related questions, please contact the cardiac imaging nurse navigator should you have any questions/concerns: Marchia Bond, Cardiac Imaging Nurse Navigator Gordy Clement, Cardiac Imaging Nurse Navigator [facility] Direct Office Dial: [phone]   For scheduling needs, including cancellations and rescheduling, please call [place], [phone].  Follow-Up: At [facility], you and your health needs are our priority.  As part of our continuing mission to provide you with exceptional heart care, we have created designated Provider Care Teams.  These Care Teams include your primary Cardiologist (physician) and Advanced Practice Providers (APPs -  Physician Assistants and Nurse Practitioners) who all work together to provide you with the care you need, when you need it.  We recommend signing up for the patient portal called "MyChart".  Sign up information is provided on this After Visit Summary.  MyChart is used to connect with patients for Virtual Visits (Telemedicine).  Patients are able to view lab/test results, encounter notes, upcoming appointments, etc.  Non-urgent messages can be sent to your provider as well.   To learn more about what you can do with MyChart, go to [email or web address].    Your next appointment:   Follow-up after testing completed  Provider:   You may see Kate Sable, MD or one of the following Advanced Practice Providers on your designated Care Team:    Murray Hodgkins, NP Christell Faith, PA-C Cadence Kathlen Mody, PA-C Gerrie Nordmann, NP   Other Instructions -None    Signed, Kate Sable, MD  09/21/2022 12:15 PM    [place]

## 2022-09-26 ENCOUNTER — Other Ambulatory Visit: Payer: Self-pay

## 2022-10-01 DIAGNOSIS — F331 Major depressive disorder, recurrent, moderate: Secondary | ICD-10-CM | POA: Diagnosis not present

## 2022-10-01 DIAGNOSIS — F411 Generalized anxiety disorder: Secondary | ICD-10-CM | POA: Diagnosis not present

## 2022-10-07 ENCOUNTER — Other Ambulatory Visit: Payer: Self-pay | Admitting: Family Medicine

## 2022-10-07 NOTE — Telephone Encounter (Signed)
Last office visit 08/27/22 for SOB/CP.  Last refilled 03/22/22 for #180 with 1 refill.  Next appt: No future appointments with PCP.

## 2022-10-15 ENCOUNTER — Other Ambulatory Visit
Admission: RE | Admit: 2022-10-15 | Discharge: 2022-10-15 | Disposition: A | Discharge status: Home / Self Care | Payer: BC Managed Care – PPO | Attending: Cardiology | Admitting: Cardiology

## 2022-10-15 DIAGNOSIS — F411 Generalized anxiety disorder: Secondary | ICD-10-CM | POA: Diagnosis not present

## 2022-10-15 DIAGNOSIS — R079 Chest pain, unspecified: Secondary | ICD-10-CM | POA: Insufficient documentation

## 2022-10-15 DIAGNOSIS — R072 Precordial pain: Secondary | ICD-10-CM | POA: Diagnosis not present

## 2022-10-15 DIAGNOSIS — E7800 Pure hypercholesterolemia, unspecified: Secondary | ICD-10-CM | POA: Insufficient documentation

## 2022-10-15 DIAGNOSIS — F331 Major depressive disorder, recurrent, moderate: Secondary | ICD-10-CM | POA: Diagnosis not present

## 2022-10-15 LAB — BASIC METABOLIC PANEL
Anion gap: 10 (ref 5–15)
BUN: 15 mg/dL (ref 6–20)
CO2: 25 mmol/L (ref 22–32)
Calcium: 9.6 mg/dL (ref 8.9–10.3)
Chloride: 102 mmol/L (ref 98–111)
Creatinine, Ser: 0.69 mg/dL (ref 0.44–1.00)
GFR, Estimated: >60 mL/min (ref >60)
Glucose, Bld: 112 mg/dL — ABNORMAL HIGH (ref 70–99)
Potassium: 4.2 mmol/L (ref 3.5–5.1)
Sodium: 137 mmol/L (ref 135–145)

## 2022-10-15 LAB — CBC
HCT: 37 % (ref 36.0–46.0)
Hemoglobin: 11.4 g/dL — ABNORMAL LOW (ref 12.0–15.0)
MCH: 25.1 pg — ABNORMAL LOW (ref 26.0–34.0)
MCHC: 30.8 g/dL (ref 30.0–36.0)
MCV: 81.5 fL (ref 80.0–100.0)
Platelets: 343 10*3/uL (ref 150–400)
RBC: 4.54 MIL/uL (ref 3.87–5.11)
WBC: 7.4 10*3/uL (ref 4.0–10.5)
nRBC: 0 % (ref 0.0–0.2)

## 2022-10-17 ENCOUNTER — Telehealth (HOSPITAL_COMMUNITY): Payer: Self-pay | Admitting: *Deleted

## 2022-10-17 NOTE — Telephone Encounter (Signed)
Reaching out to patient to offer assistance regarding upcoming cardiac imaging study; pt verbalizes understanding of appt date/time, parking situation and where to check in, pre-test NPO status and medications ordered, and verified current allergies; name and call back number provided for further questions should they arise ? ?Arda Daggs RN Navigator Cardiac Imaging ?[facility] ?[phone] office ?[phone] cell ? ?Patient to take 50mg metoprolol tartrate and 15mg ivabradine two hours prior to her cardiac CT scan.   ?

## 2022-10-18 ENCOUNTER — Ambulatory Visit
Admission: RE | Admit: 2022-10-18 | Discharge: 2022-10-18 | Disposition: A | Discharge status: Home / Self Care | Payer: BC Managed Care – PPO | Source: Ambulatory Visit | Attending: Cardiology | Admitting: Cardiology

## 2022-10-18 ENCOUNTER — Other Ambulatory Visit: Payer: Self-pay | Admitting: Cardiology

## 2022-10-18 DIAGNOSIS — R079 Chest pain, unspecified: Secondary | ICD-10-CM | POA: Diagnosis not present

## 2022-10-18 DIAGNOSIS — R072 Precordial pain: Secondary | ICD-10-CM | POA: Diagnosis not present

## 2022-10-18 MED ORDER — METOPROLOL TARTRATE 5 MG/5ML IV SOLN
10.0000 mg | Freq: Once | INTRAVENOUS | Status: AC
  Administered 2022-10-18: 10 mg via INTRAVENOUS

## 2022-10-18 MED ORDER — NITROGLYCERIN 0.4 MG SL SUBL
0.8000 mg | SUBLINGUAL_TABLET | Freq: Once | SUBLINGUAL | Status: AC
  Administered 2022-10-18: 0.8 mg via SUBLINGUAL

## 2022-10-18 MED ORDER — IOHEXOL 350 MG/ML SOLN
100.0000 mL | Freq: Once | INTRAVENOUS | Status: AC | PRN
  Administered 2022-10-18: 100 mL via INTRAVENOUS

## 2022-10-29 DIAGNOSIS — F331 Major depressive disorder, recurrent, moderate: Secondary | ICD-10-CM | POA: Diagnosis not present

## 2022-10-29 DIAGNOSIS — F411 Generalized anxiety disorder: Secondary | ICD-10-CM | POA: Diagnosis not present

## 2022-10-30 NOTE — Progress Notes (Signed)
    Pace Lamadrid T. Khaalid Lefkowitz, MD, CAQ Sports Medicine [facility] at [facility] [address] [place], [address]  Phone: [phone]  FAX: [phone]  AXEL HACKETT - 47 y.o. female  MRN [id number]  Date of Birth: 03-Apr-1976  Date: 10/31/2022  PCP: Hannah Beat, MD  Referral: Hannah Beat, MD  No chief complaint on file.  Subjective:   DARIN SAUVE is a 47 y.o. very pleasant female patient with There is no height or weight on file to calculate BMI. who presents with the following:  Patient is here to follow-up after her cardiology consultation as well as a CT morphology study of her coronary anatomy.  She did have a coronary calcium score of 0, no evidence of cardiovascular disease on her CT scan.  She did have some extravasation of contrast material into the left antecubital fossa.  She is here to talk about this, as well.    Review of Systems is noted in the HPI, as appropriate  Objective:   There were no vitals taken for this visit.  GEN: No acute distress; alert,appropriate. PULM: Breathing comfortably in no respiratory distress PSYCH: Normally interactive.   Laboratory and Imaging Data:  Assessment and Plan:   ***

## 2022-10-31 ENCOUNTER — Ambulatory Visit: Payer: BC Managed Care – PPO | Admitting: Family Medicine

## 2022-10-31 VITALS — BP 130/78 | HR 85 | Temp 97.7°F | Ht 68.0 in | Wt 211.4 lb

## 2022-10-31 DIAGNOSIS — R079 Chest pain, unspecified: Secondary | ICD-10-CM

## 2022-10-31 DIAGNOSIS — M79602 Pain in left arm: Secondary | ICD-10-CM | POA: Diagnosis not present

## 2022-10-31 DIAGNOSIS — F411 Generalized anxiety disorder: Secondary | ICD-10-CM | POA: Diagnosis not present

## 2022-10-31 MED ORDER — SERTRALINE HCL 100 MG PO TABS: 100.0000 mg | ORAL_TABLET | Freq: Every day | ORAL | 1 refills | Status: DC

## 2022-11-01 ENCOUNTER — Encounter: Payer: Self-pay | Admitting: Family Medicine

## 2022-11-05 ENCOUNTER — Ambulatory Visit: Payer: BC Managed Care – PPO | Attending: Cardiology

## 2022-11-05 DIAGNOSIS — R079 Chest pain, unspecified: Secondary | ICD-10-CM | POA: Diagnosis not present

## 2022-11-05 LAB — ECHOCARDIOGRAM COMPLETE
AR max vel: 2.69 cm2
AV Area VTI: 2.78 cm2
AV Area mean vel: 2.67 cm2
AV Mean grad: 4 mmHg
AV Peak grad: 7 mmHg
Ao pk vel: 1.33 m/s
Area-P 1/2: 5.84 cm2
S' Lateral: 3.4 cm

## 2022-11-07 ENCOUNTER — Ambulatory Visit: Payer: BC Managed Care – PPO | Admitting: Cardiology

## 2022-11-07 ENCOUNTER — Encounter: Payer: Self-pay | Admitting: Cardiology

## 2022-11-07 ENCOUNTER — Ambulatory Visit: Payer: BC Managed Care – PPO | Attending: Cardiology | Admitting: Cardiology

## 2022-11-07 VITALS — BP 124/78 | HR 80 | Ht 68.0 in | Wt 212.2 lb

## 2022-11-07 DIAGNOSIS — R072 Precordial pain: Secondary | ICD-10-CM

## 2022-11-07 DIAGNOSIS — E7800 Pure hypercholesterolemia, unspecified: Secondary | ICD-10-CM

## 2022-11-07 NOTE — Progress Notes (Signed)
Cardiology Office Note:    Date:  11/07/2022   ID:  Priscilla King, DOB 09/04/1975, MRN [id number]  PCP:  Hannah Beat, MD   [facility] Providers Cardiologist:  Debbe Odea, MD     Referring MD: Hannah Beat, MD   Chief Complaint  Patient presents with   Follow-up    Discuss results.  Patient denies new or acute cardiac problems/concerns today.      History of Present Illness:    Priscilla King is a 47 y.o. female with a hx of hyperlipidemia, diabetes who presents for follow-up.  Previously seen due to chest pain.    Due to risk factors, echo and coronary CT was obtained to evaluate cardiac etiology.  Symptoms are not associated with exertion.  Tolerating Crestor as prescribed for hyperlipidemia.  No new concerns at this time.  Presents for testing results today.   Past Medical History:  Diagnosis Date   Diabetes mellitus type 2, uncontrolled, without complications 06/2014   History of polycystic ovaries    Hyperlipidemia LDL goal <70 06/2014   Kienbock's avascular necrosis of lunate, adult    Morbid obesity with BMI of 40.0-44.9, adult 08/14/2021    Past Surgical History:  Procedure Laterality Date   CESAREAN SECTION N/A 08/31/2012   Procedure: CESAREAN SECTION;  Surgeon: Catalina Antigua, MD;  Location: [facility];  Service: Obstetrics;  Laterality: N/A;   CESAREAN SECTION N/A 03/20/2017   Procedure: REPEAT CESAREAN SECTION;  Surgeon: Hildred Laser, MD;  Location: [facility];  Service: Obstetrics;  Laterality: N/A;  Female born @ 1500 Apgars: 8/9 Weight:10lbs 9ozs   COLONOSCOPY WITH PROPOFOL N/A 11/01/2021   Procedure: COLONOSCOPY WITH PROPOFOL;  Surgeon: Wyline Mood, MD;  Location: [facility];  Service: Gastroenterology;  Laterality: N/A;    Current Medications: Current Meds  Medication Sig   amitriptyline (ELAVIL) 10 MG tablet TAKE 1 TO 2 TABLETS (10-20 MG TOTAL) BY MOUTH AT BEDTIME   celecoxib (CELEBREX) 200 MG capsule TAKE 1 CAPSULE BY  MOUTH TWICE A DAY   Continuous Blood Gluc Sensor (FREESTYLE LIBRE 2 SENSOR) MISC Place 1 application onto the skin every 14 (fourteen) days.   diclofenac sodium (VOLTAREN) 1 % GEL Apply 2 g topically 4 (four) times daily.   metoprolol tartrate (LOPRESSOR) 50 MG tablet Take 1 tablet (50 mg total) by mouth once for 1 dose. Take 1 tablet (50 mg total) by mouth 2 hours prior to CT   Multiple Vitamin (MULTIVITAMIN ADULT PO) Take by mouth.   rosuvastatin (CRESTOR) 20 MG tablet Take 1 tablet (20 mg total) by mouth daily.   Semaglutide, 2 MG/DOSE, (OZEMPIC, 2 MG/DOSE,) 8 MG/3ML SOPN INJECT 2 MG AS DIRECTED ONCE A WEEK.   sertraline (ZOLOFT) 100 MG tablet Take 1 tablet (100 mg total) by mouth daily.     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  (10/31/2022)   Overall Financial Resource Strain (CARDIA)    Difficulty of Paying Living Expenses: Not hard at all  Food Insecurity: No Food Insecurity (10/31/2022)   Hunger Vital Sign    Worried About Running Out of Food in the Last Year: Never true    Ran Out of Food in the Last Year: Never true  Transportation  Needs: No Transportation Needs (10/31/2022)   PRAPARE - Administrator, Civil Service (Medical): No    Lack of Transportation (Non-Medical): No  Physical Activity: Insufficiently Active (10/31/2022)   Exercise Vital Sign    Days of Exercise per Week: 3 days    Minutes of Exercise per Session: 40 min  Stress: Stress Concern Present (10/31/2022)   [employer] of Occupational Health - Occupational Stress Questionnaire    Feeling of Stress : To some extent  Social Connections: Moderately Integrated (10/31/2022)   Social Connection and Isolation Panel [NHANES]    Frequency of Communication with Friends and Family: More than three times a week    Frequency of Social Gatherings with Friends and Family: Once a week    Attends Religious Services: Never    Database administrator or Organizations: Yes    Attends Engineer, structural: More than 4 times per year    Marital Status: Married     Family History: The patient's family history includes Diabetes in her maternal grandmother, mother, and paternal grandmother; Healthy in her brother, brother, daughter, sister, and son; Hypertension in her maternal grandmother and paternal grandmother; Stroke in her mother. There is no history of Breast cancer.  ROS:   Please see the history of present illness.     All other systems reviewed and are negative.  EKGs/Labs/Other Studies Reviewed:    The following studies were reviewed today:   EKG:  EKG is  ordered today.  The ekg ordered today demonstrates sinus rhythm, heart rate 94  Recent Labs: 08/27/2022: ALT 11; TSH 1.26 10/15/2022: BUN 15; Creatinine, Ser 0.69; Hemoglobin 11.4; Platelets 343; Potassium 4.2; Sodium 137  Recent Lipid Panel    Component Value Date/Time   CHOL 215 (H) 08/27/2022 0911   TRIG 61.0 08/27/2022 0911   HDL 53.00 08/27/2022 0911   CHOLHDL 4 08/27/2022 0911   VLDL 12.2 08/27/2022 0911   LDLCALC 150 (H) 08/27/2022 0911     Risk  Assessment/Calculations:             Physical Exam:    VS:  BP 124/78 (BP Location: Left Arm, Patient Position: Sitting, Cuff Size: Normal)   Pulse 80   Ht  (1.727 m)   Wt 212 lb 3.2 oz (96.3 kg)   LMP 10/30/2022   SpO2 99%   BMI 32.26 kg/m     Wt Readings from Last 3 Encounters:  11/07/22 212 lb 3.2 oz (96.3 kg)  10/31/22 211 lb 6 oz (95.9 kg)  09/21/22 202 lb (91.6 kg)     GEN:  Well nourished, well developed in no acute distress HEENT: Normal NECK: No JVD; No carotid bruits CARDIAC: RRR, no murmurs, rubs, gallops  RESPIRATORY:  Clear to auscultation without rales, wheezing or rhonchi  ABDOMEN: Soft, non-tender, non-distended MUSCULOSKELETAL:  No edema; No deformity  SKIN: Warm and dry NEUROLOGIC:  Alert and oriented x 3 PSYCHIATRIC:  Normal affect   ASSESSMENT:    1. Precordial pain   2. Pure hypercholesterolemia     PLAN:    In order of problems listed above:  Chest pain, coronary CT with calcium score 0, no evidence of CAD.  Normal systolic and diastolic function on echo, EF 55 to 60%.  Patient made aware of results, reassured. Hyperlipidemia, continue Crestor 20 mg daily.  Low-cholesterol diet advised.  Plans on rechecking lipid panel with PCP.  If stays elevated, recommend titrating Crestor.  Follow-up as needed.      Medication Adjustments/Labs and Tests Ordered: Current medicines are reviewed at length with the patient today.  Concerns regarding medicines are outlined above.  No orders of the defined types were placed in this encounter.  No orders of the defined types were placed in this encounter.   Patient Instructions  Medication Instructions:   Your physician recommends that you continue on your current medications as directed. Please refer to the Current Medication list given to you today.  *If you need a refill on your cardiac medications before your next appointment, please call your pharmacy*   Lab Work:  None Ordered  If you  have labs (blood work) drawn today and your tests are completely normal, you will receive your results only by: MyChart Message (if you have MyChart) OR A paper copy in the mail If you have any lab test that is abnormal or we need to change your treatment, we will call you to review the results.   Testing/Procedures:  None Ordered   Follow-Up: At [facility], you and your health needs are our priority.  As part of our continuing mission to provide you with exceptional heart care, we have created designated Provider Care Teams.  These Care Teams include your primary Cardiologist (physician) and Advanced Practice Providers (APPs -  Physician Assistants and Nurse Practitioners) who all work together to provide you with the care you need, when you need it.  We recommend signing up for the patient portal called "MyChart".  Sign up information is provided on this After Visit Summary.  MyChart is used to connect with patients for Virtual Visits (Telemedicine).  Patients are able to view lab/test results, encounter notes, upcoming appointments, etc.  Non-urgent messages can be sent to your provider as well.   To learn more about what you can do with MyChart, go to [email or web address].    Your next appointment:    AS NEEDED    Signed, Debbe Odea, MD  11/07/2022 9:42 AM    [facility]

## 2022-11-07 NOTE — Patient Instructions (Signed)
Medication Instructions:   Your physician recommends that you continue on your current medications as directed. Please refer to the Current Medication list given to you today.  *If you need a refill on your cardiac medications before your next appointment, please call your pharmacy*   Lab Work:  None Ordered  If you have labs (blood work) drawn today and your tests are completely normal, you will receive your results only by: MyChart Message (if you have MyChart) OR A paper copy in the mail If you have any lab test that is abnormal or we need to change your treatment, we will call you to review the results.   Testing/Procedures:  None Ordered   Follow-Up: At [facility], you and your health needs are our priority.  As part of our continuing mission to provide you with exceptional heart care, we have created designated Provider Care Teams.  These Care Teams include your primary Cardiologist (physician) and Advanced Practice Providers (APPs -  Physician Assistants and Nurse Practitioners) who all work together to provide you with the care you need, when you need it.  We recommend signing up for the patient portal called "MyChart".  Sign up information is provided on this After Visit Summary.  MyChart is used to connect with patients for Virtual Visits (Telemedicine).  Patients are able to view lab/test results, encounter notes, upcoming appointments, etc.  Non-urgent messages can be sent to your provider as well.   To learn more about what you can do with MyChart, go to [email or web address].    Your next appointment:  AS NEEDED 

## 2022-11-12 ENCOUNTER — Other Ambulatory Visit: Payer: BC Managed Care – PPO

## 2022-11-12 DIAGNOSIS — F411 Generalized anxiety disorder: Secondary | ICD-10-CM | POA: Diagnosis not present

## 2022-11-12 DIAGNOSIS — F331 Major depressive disorder, recurrent, moderate: Secondary | ICD-10-CM | POA: Diagnosis not present

## 2022-11-13 ENCOUNTER — Other Ambulatory Visit: Payer: Self-pay | Admitting: Family Medicine

## 2022-11-27 ENCOUNTER — Ambulatory Visit: Payer: BC Managed Care – PPO | Admitting: Cardiology

## 2022-11-27 DIAGNOSIS — F331 Major depressive disorder, recurrent, moderate: Secondary | ICD-10-CM | POA: Diagnosis not present

## 2022-11-27 DIAGNOSIS — F411 Generalized anxiety disorder: Secondary | ICD-10-CM | POA: Diagnosis not present

## 2022-12-07 NOTE — Progress Notes (Unsigned)
GYNECOLOGY ANNUAL PHYSICAL EXAM PROGRESS NOTE  Subjective:    Priscilla King is a 47 y.o. G61P2012 female who presents for an annual exam. The patient is sexually active. The patient participates in regular exercise: yes , walking. Has the patient ever been transfused or tattooed?: no. The patient reports that there is not domestic violence in her life.   The patient has the following complaints today:  Reports that for the past 1.5-2 years her cycles have become very heavy and clotty. Were previously lasting 5 days, now lasting between 10-12 days. Clots are between quarter and silver dollar-sized. Changes pads during heaviest days of cycle q 45 minutes. Attempted to try management with OCPs last year, however discontinued as the pills made her "feel crazy".  Currently on Ozempic for management of her diabetes.  Notes that she has also lost ~ 30 lbs so far.    Menstrual History: Menarche age: 73 Patient's last menstrual period was 11/22/2022 (exact date). Period Cycle (Days): 28 Period Duration (Days): 10-12 Period Pattern: Regular Menstrual Flow: Moderate, Heavy (large clots) Menstrual Control: Maxi pad Menstrual Control Change Freq (Hours): q45 min Dysmenorrhea: (!) Mild Dysmenorrhea Symptoms: Cramping   Gynecologic History:  Contraception: vasectomy History of STI's: Denies Last Pap: 01/29/2020 Results were: normal.  Denies h/o abnormal pap smears.  Last mammogram: 04/13/22. Results were: normal    OB History  Gravida Para Term Preterm AB Living  3 2 2  0 1 2  SAB IAB Ectopic Multiple Live Births  0 1 0 0 2    # Outcome Date GA Lbr Len/2nd Weight Sex Delivery Anes PTL Lv  3 Term 03/20/17 [redacted]w[redacted]d  10 lb 9 oz (4.79 kg) F CS-Vac Spinal  LIV     Name: Galbraith,[name]     Apgar1: 8  Apgar5: 9  2 Term 08/31/12 [redacted]w[redacted]d  8 lb 12.4 oz (3.98 kg) M CS-LVertical EPI  LIV     Name: Balli,BOY Aneita     Apgar1: 8  Apgar5: 9  1 IAB 1998    U    DEC     Birth Comments:  Elective AB    Past Medical History:  Diagnosis Date   Diabetes mellitus type 2, uncontrolled, without complications 06/2014   History of polycystic ovaries    Hyperlipidemia LDL goal <70 06/2014   Kienbock's avascular necrosis of lunate, adult    Morbid obesity with BMI of 40.0-44.9, adult (HCC) 08/14/2021    Past Surgical History:  Procedure Laterality Date   CESAREAN SECTION N/A 08/31/2012   Procedure: CESAREAN SECTION;  Surgeon: Catalina Antigua, MD;  Location: [facility] ORS;  Service: Obstetrics;  Laterality: N/A;   CESAREAN SECTION N/A 03/20/2017   Procedure: REPEAT CESAREAN SECTION;  Surgeon: Hildred Laser, MD;  Location: [facility] ORS;  Service: Obstetrics;  Laterality: N/A;  Female born @ 1500 Apgars: 8/9 Weight:10lbs 9ozs   COLONOSCOPY WITH PROPOFOL N/A 11/01/2021   Procedure: COLONOSCOPY WITH PROPOFOL;  Surgeon: Wyline Mood, MD;  Location: [facility] ENDOSCOPY;  Service: Gastroenterology;  Laterality: N/A;    Family History  Problem Relation Age of Onset   Stroke Mother        CVA (TIA)   Diabetes Mother    Healthy Sister    Healthy Daughter    Diabetes Maternal Grandmother    Hypertension Maternal Grandmother    Diabetes Paternal Grandmother    Hypertension Paternal Surveyor, minerals    Healthy Brother    Healthy Brother    Healthy Son  Breast cancer Neg Hx     Social History   Socioeconomic History   Marital status: Married    Spouse name: Not on file   Number of children: 1   Years of education: Not on file   Highest education level: Doctorate  Occupational History   Occupation: Professor    Employer: [employer]    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  (10/31/2022)   Overall Financial Resource Strain (CARDIA)    Difficulty of Paying Living Expenses: Not hard at all  Food Insecurity: No Food Insecurity (10/31/2022)   Hunger Vital Sign    Worried About Running Out of Food in the Last Year: Never true    Ran Out of Food in the Last Year: Never true  Transportation Needs: No Transportation Needs (10/31/2022)   PRAPARE - Administrator, Civil Service (Medical): No    Lack of Transportation (Non-Medical): No  Physical Activity: Insufficiently Active (10/31/2022)   Exercise Vital Sign    Days of Exercise per Week: 3 days    Minutes of Exercise per Session: 40 min  Stress: Stress Concern Present (10/31/2022)   [employer] of Occupational Health - Occupational Stress Questionnaire    Feeling of Stress : To some extent  Social Connections: Moderately Integrated (10/31/2022)   Social Connection and Isolation Panel [NHANES]    Frequency of Communication with Friends and Family: More than three times a week    Frequency of Social Gatherings with Friends and Family: Once a week    Attends Religious Services: Never    Database administrator or Organizations: Yes    Attends Engineer, structural: More than 4 times per year    Marital Status: Married  Catering manager Violence: Not on file    Current Outpatient Medications on File Prior to Visit  Medication Sig Dispense Refill   amitriptyline (ELAVIL) 10 MG tablet TAKE 1 TO 2 TABLETS (10-20 MG TOTAL) BY MOUTH AT BEDTIME 180 tablet 0   celecoxib (CELEBREX) 200 MG capsule TAKE 1 CAPSULE BY MOUTH TWICE A DAY 180 capsule 1   Continuous Blood Gluc Sensor (FREESTYLE LIBRE 2 SENSOR) MISC Place 1 application onto the skin every 14 (fourteen) days. 2 each 2   diclofenac  sodium (VOLTAREN) 1 % GEL Apply 2 g topically 4 (four) times daily. 3 Tube 5   Multiple Vitamin (MULTIVITAMIN ADULT PO) Take by mouth.     rosuvastatin (CRESTOR) 20 MG tablet Take 1 tablet (20 mg total) by mouth daily. 90 tablet 3   Semaglutide, 2 MG/DOSE, (OZEMPIC, 2 MG/DOSE,) 8 MG/3ML SOPN INJECT 2 MG AS DIRECTED ONCE A WEEK. 3 mL 0   sertraline (ZOLOFT) 100 MG tablet Take 1 tablet (100 mg total) by mouth daily. 90 tablet 1   metoprolol tartrate (LOPRESSOR) 50 MG  tablet Take 1 tablet (50 mg total) by mouth once for 1 dose. Take 1 tablet (50 mg total) by mouth 2 hours prior to CT (Patient not taking: Reported on 12/11/2022) 1 tablet 0   No current facility-administered medications on file prior to visit.    Allergies  Allergen Reactions   Vicodin [Hydrocodone-Acetaminophen] Swelling    Tongue swelling and headache     Review of Systems Constitutional: negative for chills, fatigue, fevers and sweats Eyes: negative for irritation, redness and visual disturbance Ears, nose, mouth, throat, and face: negative for hearing loss, nasal congestion, snoring and tinnitus Respiratory: negative for asthma, cough, sputum Cardiovascular: negative for chest pain, dyspnea, exertional chest pressure/discomfort, irregular heart beat, palpitations and syncope Gastrointestinal: negative for abdominal pain, change in bowel habits, nausea and vomiting Genitourinary: negative for abnormal menstrual periods, genital lesions, sexual problems and vaginal discharge, dysuria and urinary incontinence Integument/breast: negative for breast lump, breast tenderness and nipple discharge Hematologic/lymphatic: negative for bleeding and easy bruising Musculoskeletal:negative for back pain and muscle weakness Neurological: negative for dizziness, headaches, vertigo and weakness Endocrine: negative for diabetic symptoms including polydipsia, polyuria and skin dryness Allergic/Immunologic: negative for hay fever and  urticaria      Objective:  Blood pressure 131/82, pulse 76, resp. rate 15, height 5\' 8"  (1.727 m), weight 215 lb 1.6 oz (97.6 kg), last menstrual period 11/22/2022. Body mass index is 32.71 kg/m.    General Appearance:    Alert, cooperative, no distress, appears stated age, mild obesity  Head:    Normocephalic, without obvious abnormality, atraumatic  Eyes:    PERRL, conjunctiva/corneas clear, EOM's intact, both eyes  Ears:    Normal external ear canals, both ears  Nose:   Nares normal, septum midline, mucosa normal, no drainage or sinus tenderness  Throat:   Lips, mucosa, and tongue normal; teeth and gums normal  Neck:   Supple, symmetrical, trachea midline, no adenopathy; thyroid: no enlargement/tenderness/nodules; no carotid bruit or JVD  Back:     Symmetric, no curvature, ROM normal, no CVA tenderness  Lungs:     Clear to auscultation bilaterally, respirations unlabored  Chest Wall:    No tenderness or deformity   Heart:    Regular rate and rhythm, S1 and S2 normal, no murmur, rub or gallop  Breast Exam:    No tenderness, masses, or nipple abnormality  Abdomen:     Soft, non-tender, bowel sounds active all four quadrants, no masses, no organomegaly.    Genitalia:    Pelvic:external genitalia normal, vagina without lesions, discharge, or tenderness, rectovaginal septum  normal. Cervix normal in appearance, no cervical motion tenderness, no adnexal masses or tenderness.  Uterus normal size, shape, mobile, regular contours, nontender.  Rectal:    Normal external sphincter.  No hemorrhoids appreciated. Internal exam not done.   Extremities:   Extremities normal, atraumatic, no cyanosis or edema  Pulses:   2+ and symmetric all extremities  Skin:   Skin color, texture, turgor normal, no rashes or lesions  Lymph nodes:   Cervical, supraclavicular, and axillary nodes normal  Neurologic:   CNII-XII intact, normal strength, sensation and reflexes throughout   .  Labs:  Lab Results   Component Value Date   WBC 7.4 10/15/2022   HGB 11.4 (L) 10/15/2022   HCT 37.0 10/15/2022   MCV 81.5 10/15/2022   PLT 343 10/15/2022    Lab Results  Component Value Date   CREATININE 0.69 10/15/2022   BUN 15 10/15/2022   NA 137 10/15/2022  K 4.2 10/15/2022   CL 102 10/15/2022   CO2 25 10/15/2022    Lab Results  Component Value Date   ALT 11 08/27/2022   AST 15 08/27/2022   ALKPHOS 67 08/27/2022   BILITOT 0.4 08/27/2022    Lab Results  Component Value Date   TSH 1.26 08/27/2022     Assessment:   1. Well woman exam with routine gynecological exam   2. Breast cancer screening by mammogram   3. DUB (dysfunctional uterine bleeding)   4. Cervical cancer screening   5. Type 2 diabetes mellitus without complication, without long-term current use of insulin (HCC)   6. Obesity (BMI 30-39.9)   7. Hyperlipidemia LDL goal <70      Plan:  - Blood tests: UTD by PCP. - Breast self exam technique reviewed and patient encouraged to perform self-exam monthly. - Contraception: vasectomy. - Discussed healthy lifestyle modifications. - Mammogram ordered - Pap smear  Due in 2 months, proceeded with pap smear today . - Hyperlipidemia and Type II DM managed with PCP.  - Discussed all management options for DUB, including progesterone-only pills, IUD, endometrial ablation, hysterectomy. Patient considering ablation. Will proceed with workup with EMB and ultrasound. In the meantime, will attempt to manage with Lysteda. Prescription given. Handout given on endometrial ablation.  - Follow up in 1 year for annual exam. F/u in 4 weeks for ultrasound and EMB.    Hildred Laser, MD  [facility]

## 2022-12-07 NOTE — Patient Instructions (Incomplete)
Preventive Care 40-47 Years Old, Female Preventive care refers to lifestyle choices and visits with your health care provider that can promote health and wellness. Preventive care visits are also called wellness exams. What can I expect for my preventive care visit? Counseling Your health care provider may ask you questions about your: Medical history, including: Past medical problems. Family medical history. Pregnancy history. Current health, including: Menstrual cycle. Method of birth control. Emotional well-being. Home life and relationship well-being. Sexual activity and sexual health. Lifestyle, including: Alcohol, nicotine or tobacco, and drug use. Access to firearms. Diet, exercise, and sleep habits. Work and work environment. Sunscreen use. Safety issues such as seatbelt and bike helmet use. Physical exam Your health care provider will check your: Height and weight. These may be used to calculate your BMI (body mass index). BMI is a measurement that tells if you are at a healthy weight. Waist circumference. This measures the distance around your waistline. This measurement also tells if you are at a healthy weight and may help predict your risk of certain diseases, such as type 2 diabetes and high blood pressure. Heart rate and blood pressure. Body temperature. Skin for abnormal spots. What immunizations do I need?  Vaccines are usually given at various ages, according to a schedule. Your health care provider will recommend vaccines for you based on your age, medical history, and lifestyle or other factors, such as travel or where you work. What tests do I need? Screening Your health care provider may recommend screening tests for certain conditions. This may include: Lipid and cholesterol levels. Diabetes screening. This is done by checking your blood sugar (glucose) after you have not eaten for a while (fasting). Pelvic exam and Pap test. Hepatitis B test. Hepatitis C  test. HIV (human immunodeficiency virus) test. STI (sexually transmitted infection) testing, if you are at risk. Lung cancer screening. Colorectal cancer screening. Mammogram. Talk with your health care provider about when you should start having regular mammograms. This may depend on whether you have a family history of breast cancer. BRCA-related cancer screening. This may be done if you have a family history of breast, ovarian, tubal, or peritoneal cancers. Bone density scan. This is done to screen for osteoporosis. Talk with your health care provider about your test results, treatment options, and if necessary, the need for more tests. Follow these instructions at home: Eating and drinking  Eat a diet that includes fresh fruits and vegetables, whole grains, lean protein, and low-fat dairy products. Take vitamin and mineral supplements as recommended by your health care provider. Do not drink alcohol if: Your health care provider tells you not to drink. You are pregnant, may be pregnant, or are planning to become pregnant. If you drink alcohol: Limit how much you have to 0-1 drink a day. Know how much alcohol is in your drink. In the [place] one drink equals one 12 oz bottle of beer (355 mL), one 5 oz glass of wine (148 mL), or one 1 oz glass of hard liquor (44 mL). Lifestyle Brush your teeth every morning and night with fluoride toothpaste. Floss one time each day. Exercise for at least 30 minutes 5 or more days each week. Do not use any products that contain nicotine or tobacco. These products include cigarettes, chewing tobacco, and vaping devices, such as e-cigarettes. If you need help quitting, ask your health care provider. Do not use drugs. If you are sexually active, practice safe sex. Use a condom or other form of protection to   prevent STIs. If you do not wish to become pregnant, use a form of birth control. If you plan to become pregnant, see your health care provider for a  prepregnancy visit. Take aspirin only as told by your health care provider. Make sure that you understand how much to take and what form to take. Work with your health care provider to find out whether it is safe and beneficial for you to take aspirin daily. Find healthy ways to manage stress, such as: Meditation, yoga, or listening to music. Journaling. Talking to a trusted person. Spending time with friends and family. Minimize exposure to UV radiation to reduce your risk of skin cancer. Safety Always wear your seat belt while driving or riding in a vehicle. Do not drive: If you have been drinking alcohol. Do not ride with someone who has been drinking. When you are tired or distracted. While texting. If you have been using any mind-altering substances or drugs. Wear a helmet and other protective equipment during sports activities. If you have firearms in your house, make sure you follow all gun safety procedures. Seek help if you have been physically or sexually abused. What's next? Visit your health care provider once a year for an annual wellness visit. Ask your health care provider how often you should have your eyes and teeth checked. Stay up to date on all vaccines. This information is not intended to replace advice given to you by your health care provider. Make sure you discuss any questions you have with your health care provider. Document Revised: 01/04/2021 Document Reviewed: 01/04/2021 Elsevier Patient Education  2023 Elsevier Inc. Breast Self-Awareness Breast self-awareness is knowing how your breasts look and feel. You need to: Check your breasts on a regular basis. Tell your doctor about any changes. Become familiar with the look and feel of your breasts. This can help you catch a breast problem while it is still small and can be treated. You should do breast self-exams even if you have breast implants. What you need: A mirror. A well-lit room. A pillow or other  soft object. How to do a breast self-exam Follow these steps to do a breast self-exam: Look for changes  Take off all the clothes above your waist. Stand in front of a mirror in a room with good lighting. Put your hands down at your sides. Compare your breasts in the mirror. Look for any difference between them, such as: A difference in shape. A difference in size. Wrinkles, dips, and bumps in one breast and not the other. Look at each breast for changes in the skin, such as: Redness. Scaly areas. Skin that has gotten thicker. Dimpling. Open sores (ulcers). Look for changes in your nipples, such as: Fluid coming out of a nipple. Fluid around a nipple. Bleeding. Dimpling. Redness. A nipple that looks pushed in (retracted), or that has changed position. Feel for changes Lie on your back. Feel each breast. To do this: Pick a breast to feel. Place a pillow under the shoulder closest to that breast. Put the arm closest to that breast behind your head. Feel the nipple area of that breast using the hand of your other arm. Feel the area with the pads of your three middle fingers by making small circles with your fingers. Use light, medium, and firm pressure. Continue the overlapping circles, moving downward over the breast. Keep making circles with your fingers. Stop when you feel your ribs. Start making circles with your fingers again, this time going   upward until you reach your collarbone. Then, make circles outward across your breast and into your armpit area. Squeeze your nipple. Check for discharge and lumps. Repeat these steps to check your other breast. Sit or stand in the tub or shower. With soapy water on your skin, feel each breast the same way you did when you were lying down. Write down what you find Writing down what you find can help you remember what to tell your doctor. Write down: What is normal for each breast. Any changes you find in each breast. These  include: The kind of changes you find. A tender or painful breast. Any lump you find. Write down its size and where it is. When you last had your monthly period (menstrual cycle). General tips If you are breastfeeding, the best time to check your breasts is after you feed your baby or after you use a breast pump. If you get monthly bleeding, the best time to check your breasts is 5-7 days after your monthly cycle ends. With time, you will become comfortable with the self-exam. You will also start to know if there are changes in your breasts. Contact a doctor if: You see a change in the shape or size of your breasts or nipples. You see a change in the skin of your breast or nipples, such as red or scaly skin. You have fluid coming from your nipples that is not normal. You find a new lump or thick area. You have breast pain. You have any concerns about your breast health. Summary Breast self-awareness includes looking for changes in your breasts and feeling for changes within your breasts. You should do breast self-awareness in front of a mirror in a well-lit room. If you get monthly periods (menstrual cycles), the best time to check your breasts is 5-7 days after your period ends. Tell your doctor about any changes you see in your breasts. Changes include changes in size, changes on the skin, painful or tender breasts, or fluid from your nipples that is not normal. This information is not intended to replace advice given to you by your health care provider. Make sure you discuss any questions you have with your health care provider. Document Revised: 12/14/2021 Document Reviewed: 05/11/2021 Elsevier Patient Education  2023 Elsevier Inc.  

## 2022-12-10 DIAGNOSIS — F331 Major depressive disorder, recurrent, moderate: Secondary | ICD-10-CM | POA: Diagnosis not present

## 2022-12-10 DIAGNOSIS — F411 Generalized anxiety disorder: Secondary | ICD-10-CM | POA: Diagnosis not present

## 2022-12-11 ENCOUNTER — Ambulatory Visit (INDEPENDENT_AMBULATORY_CARE_PROVIDER_SITE_OTHER): Payer: BC Managed Care – PPO | Admitting: Obstetrics and Gynecology

## 2022-12-11 ENCOUNTER — Encounter: Payer: Self-pay | Admitting: Obstetrics and Gynecology

## 2022-12-11 ENCOUNTER — Other Ambulatory Visit (HOSPITAL_COMMUNITY)
Admission: RE | Admit: 2022-12-11 | Discharge: 2022-12-11 | Disposition: A | Discharge status: Home / Self Care | Payer: BC Managed Care – PPO | Source: Ambulatory Visit | Attending: Obstetrics and Gynecology | Admitting: Obstetrics and Gynecology

## 2022-12-11 VITALS — BP 131/82 | HR 76 | Resp 15 | Ht 68.0 in | Wt 215.1 lb

## 2022-12-11 DIAGNOSIS — Z124 Encounter for screening for malignant neoplasm of cervix: Secondary | ICD-10-CM | POA: Diagnosis not present

## 2022-12-11 DIAGNOSIS — E119 Type 2 diabetes mellitus without complications: Secondary | ICD-10-CM

## 2022-12-11 DIAGNOSIS — Z01419 Encounter for gynecological examination (general) (routine) without abnormal findings: Secondary | ICD-10-CM | POA: Diagnosis not present

## 2022-12-11 DIAGNOSIS — E669 Obesity, unspecified: Secondary | ICD-10-CM

## 2022-12-11 DIAGNOSIS — E785 Hyperlipidemia, unspecified: Secondary | ICD-10-CM

## 2022-12-11 DIAGNOSIS — Z1231 Encounter for screening mammogram for malignant neoplasm of breast: Secondary | ICD-10-CM

## 2022-12-11 DIAGNOSIS — Z01411 Encounter for gynecological examination (general) (routine) with abnormal findings: Secondary | ICD-10-CM

## 2022-12-11 DIAGNOSIS — N938 Other specified abnormal uterine and vaginal bleeding: Secondary | ICD-10-CM | POA: Diagnosis not present

## 2022-12-11 MED ORDER — TRANEXAMIC ACID 650 MG PO TABS: 1300.0000 mg | ORAL_TABLET | Freq: Three times a day (TID) | ORAL | 2 refills | Status: DC

## 2022-12-17 LAB — CYTOLOGY - PAP: Diagnosis: NEGATIVE

## 2022-12-27 DIAGNOSIS — F331 Major depressive disorder, recurrent, moderate: Secondary | ICD-10-CM | POA: Diagnosis not present

## 2022-12-27 DIAGNOSIS — F411 Generalized anxiety disorder: Secondary | ICD-10-CM | POA: Diagnosis not present

## 2023-01-08 ENCOUNTER — Ambulatory Visit: Payer: BC Managed Care – PPO | Admitting: Obstetrics and Gynecology

## 2023-01-08 ENCOUNTER — Encounter: Payer: Self-pay | Admitting: Obstetrics and Gynecology

## 2023-01-08 ENCOUNTER — Ambulatory Visit
Admission: RE | Admit: 2023-01-08 | Discharge: 2023-01-08 | Disposition: A | Discharge status: Home / Self Care | Payer: BC Managed Care – PPO | Source: Ambulatory Visit | Attending: Obstetrics and Gynecology | Admitting: Obstetrics and Gynecology

## 2023-01-08 ENCOUNTER — Other Ambulatory Visit: Payer: BC Managed Care – PPO

## 2023-01-08 ENCOUNTER — Other Ambulatory Visit (HOSPITAL_COMMUNITY)
Admission: RE | Admit: 2023-01-08 | Discharge: 2023-01-08 | Disposition: A | Discharge status: Home / Self Care | Payer: BC Managed Care – PPO | Source: Ambulatory Visit | Attending: Obstetrics and Gynecology | Admitting: Obstetrics and Gynecology

## 2023-01-08 VITALS — BP 121/79 | HR 86 | Resp 16 | Ht 68.0 in | Wt 216.5 lb

## 2023-01-08 DIAGNOSIS — N938 Other specified abnormal uterine and vaginal bleeding: Secondary | ICD-10-CM | POA: Insufficient documentation

## 2023-01-08 DIAGNOSIS — N920 Excessive and frequent menstruation with regular cycle: Secondary | ICD-10-CM

## 2023-01-08 DIAGNOSIS — Z01818 Encounter for other preprocedural examination: Secondary | ICD-10-CM | POA: Diagnosis not present

## 2023-01-08 DIAGNOSIS — N939 Abnormal uterine and vaginal bleeding, unspecified: Secondary | ICD-10-CM | POA: Diagnosis not present

## 2023-01-08 DIAGNOSIS — N840 Polyp of corpus uteri: Secondary | ICD-10-CM

## 2023-01-08 DIAGNOSIS — D259 Leiomyoma of uterus, unspecified: Secondary | ICD-10-CM | POA: Diagnosis not present

## 2023-01-08 MED ORDER — MEDROXYPROGESTERONE ACETATE 10 MG PO TABS: 10.0000 mg | ORAL_TABLET | Freq: Every day | ORAL | 0 refills | Status: DC

## 2023-01-08 NOTE — Patient Instructions (Addendum)
GYNECOLOGY PRE-OPERATIVE INSTRUCTIONS  You are scheduled for surgery on 02/04/2023.  The name of your procedure is: Hysteroscopy Dilation and Curretage, Endometrial Ablation.   Please read through these instructions carefully regarding preparation for your surgery: Nothing to eat after midnight on the day prior to surgery.  Do not take any medications unless recommended by your provider on day prior to surgery.  Do not take NSAIDs (Motrin, Aleve) or aspirin 7 days prior to surgery.  You may take Tylenol products for minor aches and pains.  You will receive a prescription for pain medications post-operatively.  You will be contacted by phone approximately 1-2 weeks prior to surgery to schedule your pre-operative appointment.  You will need someone to drive you to and from surgery.  Please call the office if you have any questions regarding your upcoming surgery.    Thank you for choosing [facility].      Endometrial Ablation, Care After The following information offers guidance on how to care for yourself after your procedure. Your health care provider may also give you more specific instructions. If you have problems or questions, contact your health care provider. What can I expect after the procedure? After the procedure, it is common to have: A need to urinate more often than usual for the first 24 hours. Cramps that feel like menstrual cramps. These may last for 1-2 days. A thin, watery vaginal discharge that is light pink or brown. This may last for a few weeks. Discharge will be heavy for the first few days after your procedure. You may need to wear a sanitary pad. Nausea. Vaginal bleeding for 4-6 weeks after the procedure, as tissue healing occurs. Follow these instructions at home: Medicines  Take over-the-counter and prescription medicines only as told by your health care provider. If you were prescribed an antibiotic medicine, take it as told by your health care  provider. Do not stop taking the antibiotic even if you start to feel better. Ask your health care provider if the medicine prescribed to you: Requires you to avoid driving or using machinery. Can cause constipation. You may need to take these actions to prevent or treat constipation: Drink enough fluid to keep your urine pale yellow. Take over-the-counter or prescription medicines. Eat foods that are high in fiber, such as beans, whole grains, and fresh fruits and vegetables. Limit foods that are high in fat and processed sugars, such as fried or sweet foods. Activity If you were given a sedative during the procedure, it can affect you for several hours. Do not drive or operate machinery until your health care provider says that it is safe. Do not have sex or put anything into your vagina until your health care provider says that it is safe. Do not lift anything that is heavier than 5 lb (2.3 kg), or the limit that you are told, until your health care provider says that it is safe. Return to your normal activities as told by your health care provider. Ask your health care provider what activities are safe for you. General instructions Do not take baths, swim, or use a hot tub until your health care provider says that it is safe. You will be able to take showers. Check your vaginal area every day for signs of infection. Check for: Redness, swelling, or more pain. More blood coming from your vagina. A bad-smelling discharge. Keep all follow-up visits. This is important. Contact a health care provider if: You have vaginal redness, swelling, or more pain.  You have discharge or bleeding from your vagina that is getting worse. You have a bad-smelling vaginal discharge. You have a fever or chills. You have trouble urinating. Get help right away if: You have heavy, bright red vaginal bleeding that may include blood clots. You have severe cramps that do not get better with  medicine. Summary After endometrial ablation, it is normal to have a thin, watery vaginal discharge that is light pink or brown. This may last a few weeks and may be heavier right after the procedure. Vaginal bleeding is common after the procedure and should get better with time. Check your vaginal area every day for signs of infection, such as a bad-smelling discharge. Keep all follow-up visits. This is important. This information is not intended to replace advice given to you by your health care provider. Make sure you discuss any questions you have with your health care provider. Document Revised: 01/28/2020 Document Reviewed: 01/28/2020 Elsevier Patient Education  2024 [employer].

## 2023-01-08 NOTE — Progress Notes (Signed)
    GYNECOLOGY PROGRESS NOTE  Subjective:    Patient ID: Priscilla King, female    DOB: 04/20/1976, 47 y.o.   MRN: [id number]  HPI  Patient is a 47 y.o. [id number] female who presents for endometrial biopsy and possible pre-op.  She is currently experiencing menorrhagia for the past 1.5- 2 years with passage of large clots, cycles lasting 10-12 days, changing pads q 45 minutes during heaviest days Also presents for follow up after ultrasound that was performed just prior to today's visit.   Patient was prescribed Lysteda at her last visit to help with bleeding, however notes that it did not really help. States that her cycle was lighter for the 5 days on the medication, but after stopping, her bleeding then became heavy again and lasted for an additional 5-7 days. Is interested in endometrial ablation. Has read over handout given last visit.   The following portions of the patient's history were reviewed and updated as appropriate: allergies, current medications, past family history, past medical history, past social history, past surgical history, and problem list.  Review of Systems Pertinent items noted in HPI and remainder of comprehensive ROS otherwise negative.   Objective:   Blood pressure 121/79, pulse 86, resp. rate 16, height 5\' 8"  (1.727 m), weight 216 lb 8 oz (98.2 kg), last menstrual period 11/22/2022.  Body mass index is 32.92 kg/m. General appearance: alert and no distress Abdomen: soft, non-tender; bowel sounds normal; no masses,  no organomegaly Pelvic: External genitalia normal. Vagina without discharge. Cervix normal appearing, no lesions. Endometrial biopsy performed    Imaging:  Pending  Assessment:   1. Menorrhagia with regular cycle   2. Preoperative exam for gynecologic surgery      Plan:   - DUB: Patient declines use of hormonal options as she has dealt with this in the past and notes that these made her feel "crazy".   Patient desires surgical management  with endometrial ablation.  Reviewed risks/benefits of procedure in detail. Will change from Lysteda to Provera to take for 10 days leading up to surgery as patient will have a cycle leading up to date.  Patient notes understanding.  Surgery to be scheduled for 7/15 tentatively pending results of biopsy and ultrasound.     Endometrial Biopsy Procedure Note  The patient is positioned on the exam table in the dorsal lithotomy position. Bimanual exam confirms uterine position and size. A Graves speculum is placed into the vagina. A single toothed tenaculum is placed onto the anterior lip of the cervix. The pipette is placed into the endocervical canal and is advanced to the uterine fundus. Using a piston like technique, with vacuum created by withdrawing the stylus, the endometrial specimen is obtained and transferred to the biopsy container. Minimal bleeding is encountered. The procedure is well tolerated.   Uterine Position: anterior    Uterine Length: 10 cm   Uterine Specimen: Average   Post procedure instructions are given.     Hildred Laser, MD [facility]

## 2023-01-10 DIAGNOSIS — F331 Major depressive disorder, recurrent, moderate: Secondary | ICD-10-CM | POA: Diagnosis not present

## 2023-01-10 DIAGNOSIS — F411 Generalized anxiety disorder: Secondary | ICD-10-CM | POA: Diagnosis not present

## 2023-01-10 LAB — SURGICAL PATHOLOGY

## 2023-01-11 ENCOUNTER — Telehealth: Payer: Self-pay | Admitting: Obstetrics and Gynecology

## 2023-01-11 NOTE — Telephone Encounter (Addendum)
Virtual Visit via Telephone Note  I connected with Priscilla King on 01/11/2023 at 5:05 PM at  by telephone and verified that I am speaking with the correct person using two identifiers.  Location: Patient: Home Provider: Office   I discussed the limitations, risks, security and privacy concerns of performing an evaluation and management service by telephone and the availability of in person appointments. I also discussed with the patient that there may be a patient responsible charge related to this service. The patient expressed understanding and agreed to proceed.   History of Present Illness: Patient is a 47 y.o. [id number] female with history of menorrhagia with regular ccyle, planning for surgical management with endometrial ablation in the next few weeks. Contacted patient regarding results of recent ultrasound and imaging.  Endometrial biopsy noting likely polyp.  Ultrasound noting ~ 6 cm fibroid, with impingement on endometrial cavity.       Observations/Objective: Vitals deferred as telephone encounter. Recent vitals from 3 days ago:  Blood pressure 121/79, pulse 86, resp. rate 16, height 5\' 8"  (1.727 m), weight 216 lb 8 oz (98.2 kg), last menstrual period 11/22/2022.  Body mass index is 32.92 kg/m.   Gen App: Patient sounds well appearing, no acute distress.  Remainder of exam deferred.     Labs:   Office Visit on 01/08/2023  Component Date Value Ref Range Status   SURGICAL PATHOLOGY 01/08/2023    Final-Edited                   Value:SURGICAL PATHOLOGY CASE: [id number] PATIENT: [facility] Surgical Pathology Report     Clinical History: DUB (cm)     FINAL MICROSCOPIC DIAGNOSIS:  A. ENDOMETRIUM, BIOPSY: -  Polypoid fragments of mixed phase endometrium (weakly proliferative and focal secretory) with focal prominent vessels suggestive of polyp. -  Focally there is evidence of stromal spindling and an immunohistochemical stain for plasma cells is pending  and will be reported in an addendum.    GROSS DESCRIPTION:  Specimen is received in formalin and consists of a 2.1 x 1.5 x 0.3 cm aggregate of red-brown clotted blood and soft tissue.  The specimen is entirely submitted in 1 cassette.  Lovey Newcomer 01/09/2023)    Final Diagnosis performed by Orene Desanctis DO.   Electronically signed 01/10/2023 Technical component performed at [employer], [address] [place], [place] [address].  Professional component performed at [facility], [address]                         ., [place], [place] [address].  Immunohistochemistry Technical component (if applicable) was performed at [facility]. [address], [place], [place] [address].   IMMUNOHISTOCHEMISTRY DISCLAIMER (if applicable): Some of these immunohistochemical stains may have been developed and the performance characteristics determine by [facility]. Some may not have been cleared or approved by the U.S. Food and Drug Administration. The FDA has determined that such clearance or approval is not necessary. This test is used for clinical purposes. It should not be regarded as investigational or for research. This laboratory is certified under the Clinical Laboratory Improvement Amendments of 1988 (CLIA-88) as qualified to perform high complexity clinical laboratory testing.  The controls stained appropriately.   IHC stains are performed on formalin fixed, paraffin embedded tissue using a 3,3"diaminobenzidine (DAB) chromogen and Leica Bond Autostainer  System. The staining intensity of the nucleus is score manually and is reported as the percentage of tumor cell nuclei demonstrating specific nuclear staining. The specimens are fixed in 10% Neutral Formalin for at least 6 hours and up to 72hrs. These tests are validated on decalcified tissue. Results should be interpreted with caution  given the possibility of false negative results on decalcified specimens. Antibody Clones are as follows ER-clone 11F, PR-clone 16, Ki67- clone MM1. Some of these immunohistochemical stains may have been developed and the performance characteristics determined by [facility].     Assessment and Plan: Discussed all management options with patient, including continuation with original planned procedure with endometrial ablation, however can potentially know add partial myomectomy of intracavitary lesion.  Also could consider hysteroscopic radiofrequency ablation of fibroid, uterine fibroid embolization, or hysterectomy.  Patient would like to think over options. Briefly discussed risks/benefits of each procedure. Also provided handout in Mychart. .   Follow Up Instructions: Patient will notify of which procedure is desired. To notify surgery scheduler.    I discussed the assessment and treatment plan with the patient. The patient was provided an opportunity to ask questions and all were answered. The patient agreed with the plan and demonstrated an understanding of the instructions.   The patient was advised to call back or seek an in-person evaluation if the symptoms worsen or if the condition fails to improve as anticipated.  I provided 13 minutes of non-face-to-face time during this encounter.   Hildred Laser, MD

## 2023-01-13 ENCOUNTER — Telehealth: Payer: BC Managed Care – PPO | Admitting: Family

## 2023-01-13 DIAGNOSIS — U071 COVID-19: Secondary | ICD-10-CM

## 2023-01-13 DIAGNOSIS — E1165 Type 2 diabetes mellitus with hyperglycemia: Secondary | ICD-10-CM | POA: Diagnosis not present

## 2023-01-13 HISTORY — DX: COVID-19: U07.1

## 2023-01-13 MED ORDER — BENZONATATE 100 MG PO CAPS: 100.0000 mg | ORAL_CAPSULE | Freq: Three times a day (TID) | ORAL | 0 refills | Status: DC | PRN

## 2023-01-13 MED ORDER — MOLNUPIRAVIR EUA 200MG CAPSULE: 4.0000 | ORAL_CAPSULE | Freq: Two times a day (BID) | ORAL | 0 refills | Status: DC

## 2023-01-13 NOTE — Progress Notes (Signed)
Virtual Visit Consent   Priscilla King, you are scheduled for a virtual visit with a [facility] provider today. Just as with appointments in the office, your consent must be obtained to participate. Your consent will be active for this visit and any virtual visit you may have with one of our providers in the next 365 days. If you have a MyChart account, a copy of this consent can be sent to you electronically.  As this is a virtual visit, video technology does not allow for your provider to perform a traditional examination. This may limit your provider's ability to fully assess your condition. If your provider identifies any concerns that need to be evaluated in person or the need to arrange testing (such as labs, EKG, etc.), we will make arrangements to do so. Although advances in technology are sophisticated, we cannot ensure that it will always work on either your end or our end. If the connection with a video visit is poor, the visit may have to be switched to a telephone visit. With either a video or telephone visit, we are not always able to ensure that we have a secure connection.  By engaging in this virtual visit, you consent to the provision of healthcare and authorize for your insurance to be billed (if applicable) for the services provided during this visit. Depending on your insurance coverage, you may receive a charge related to this service.  I need to obtain your verbal consent now. Are you willing to proceed with your visit today? Priscilla King has provided verbal consent on 01/13/2023 for a virtual visit (video or telephone). Priscilla Rodney, FNP  Date: 01/13/2023 8:36 AM  Virtual Visit via Video Note   I, Priscilla King, connected with  Priscilla King  ([id number], 1975-08-10) on 01/13/23 at  8:45 AM EDT by a video-enabled telemedicine application and verified that I am speaking with the correct person using two identifiers.  Location: Patient: Virtual Visit Location Patient:  Home Provider: Virtual Visit Location Provider: Home Office   I discussed the limitations of evaluation and management by telemedicine and the availability of in person appointments. The patient expressed understanding and agreed to proceed.    History of Present Illness: Priscilla King is a 47 y.o. who identifies as a female who was assigned female at birth, and is being seen today for COVID. Reports her symptoms started Thursday and tested positive this morning.   HPI: Cough This is a new problem. The current episode started in the past 7 days. The problem has been unchanged. The problem occurs every few minutes. The cough is Non-productive. Associated symptoms include ear congestion, a fever, headaches, myalgias, nasal congestion, postnasal drip and a sore throat. Pertinent negatives include no chills, ear pain, shortness of breath or wheezing. She has tried rest and OTC cough suppressant for the symptoms. The treatment provided mild relief.    Problems:  Patient Active Problem List   Diagnosis Date Noted   Morbid obesity with BMI of 40.0-44.9, adult (HCC) 08/14/2021   Osteoarthritis, multiple sites 06/22/2021   Femoroacetabular impingement of right hip 03/29/2021   Major depression, recurrent (HCC) 09/16/2019   Hyperlipidemia LDL goal <70    Kienboeck disease of adults    Uncontrolled type 2 diabetes mellitus with hyperglycemia, without long-term current use of insulin (HCC) 06/22/2014   PCOS (polycystic ovarian syndrome) 04/15/2012   OTHER SPECIFIED FORMS OF HEARING LOSS 03/10/2009    Allergies:  Allergies  Allergen Reactions   Vicodin [  Hydrocodone-Acetaminophen] Swelling    Tongue swelling and headache   Medications:  Current Outpatient Medications:    benzonatate (TESSALON PERLES) 100 MG capsule, Take 1 capsule (100 mg total) by mouth 3 (three) times daily as needed., Disp: 20 capsule, Rfl: 0   molnupiravir EUA (LAGEVRIO) 200 mg CAPS capsule, Take 4 capsules (800 mg total)  by mouth 2 (two) times daily for 5 days., Disp: 40 capsule, Rfl: 0   amitriptyline (ELAVIL) 10 MG tablet, TAKE 1 TO 2 TABLETS (10-20 MG TOTAL) BY MOUTH AT BEDTIME, Disp: 180 tablet, Rfl: 0   celecoxib (CELEBREX) 200 MG capsule, TAKE 1 CAPSULE BY MOUTH TWICE A DAY, Disp: 180 capsule, Rfl: 1   Continuous Blood Gluc Sensor (FREESTYLE LIBRE 2 SENSOR) MISC, Place 1 application onto the skin every 14 (fourteen) days., Disp: 2 each, Rfl: 2   diclofenac sodium (VOLTAREN) 1 % GEL, Apply 2 g topically 4 (four) times daily., Disp: 3 Tube, Rfl: 5   medroxyPROGESTERone (PROVERA) 10 MG tablet, Take 1 tablet (10 mg total) by mouth daily. Use for ten days, start on Day 1 of cycle., Disp: 10 tablet, Rfl: 0   Multiple Vitamin (MULTIVITAMIN ADULT PO), Take by mouth., Disp: , Rfl:    rosuvastatin (CRESTOR) 20 MG tablet, Take 1 tablet (20 mg total) by mouth daily., Disp: 90 tablet, Rfl: 3   Semaglutide, 2 MG/DOSE, (OZEMPIC, 2 MG/DOSE,) 8 MG/3ML SOPN, INJECT 2 MG AS DIRECTED ONCE A WEEK., Disp: 3 mL, Rfl: 0   sertraline (ZOLOFT) 100 MG tablet, Take 1 tablet (100 mg total) by mouth daily., Disp: 90 tablet, Rfl: 1  Observations/Objective: Patient is well-developed, well-nourished in no acute distress.  Resting comfortably  at home.  Head is normocephalic, atraumatic.  No labored breathing.  Speech is clear and coherent with logical content.  Patient is alert and oriented at baseline.  Nasal congestion   Assessment and Plan: 1. COVID-19 - molnupiravir EUA (LAGEVRIO) 200 mg CAPS capsule; Take 4 capsules (800 mg total) by mouth 2 (two) times daily for 5 days.  Dispense: 40 capsule; Refill: 0 - benzonatate (TESSALON PERLES) 100 MG capsule; Take 1 capsule (100 mg total) by mouth 3 (three) times daily as needed.  Dispense: 20 capsule; Refill: 0  2. Uncontrolled type 2 diabetes mellitus with hyperglycemia, without long-term current use of insulin (HCC)  COVID positive, rest, force fluids, tylenol as needed, Quarantine  for at least 5 days and you are fever free, then must wear a mask out in public from day 6-10, report any worsening symptoms such as increased shortness of breath, swelling, or continued high fevers. Possible adverse effects discussed with antivirals.    Follow Up Instructions: I discussed the assessment and treatment plan with the patient. The patient was provided an opportunity to ask questions and all were answered. The patient agreed with the plan and demonstrated an understanding of the instructions.  A copy of instructions were sent to the patient via MyChart unless otherwise noted below.     The patient was advised to call back or seek an in-person evaluation if the symptoms worsen or if the condition fails to improve as anticipated.  Time:  I spent 6 minutes with the patient via telehealth technology discussing the above problems/concerns.    Priscilla Rodney, FNP

## 2023-01-18 ENCOUNTER — Encounter
Admission: RE | Admit: 2023-01-18 | Discharge: 2023-01-18 | Disposition: A | Discharge status: Home / Self Care | Payer: BC Managed Care – PPO | Source: Ambulatory Visit | Attending: Obstetrics and Gynecology | Admitting: Obstetrics and Gynecology

## 2023-01-18 DIAGNOSIS — Z01818 Encounter for other preprocedural examination: Secondary | ICD-10-CM

## 2023-01-18 HISTORY — DX: Excessive and frequent menstruation with regular cycle: N92.0

## 2023-01-18 HISTORY — DX: Unspecified osteoarthritis, unspecified site: M19.90

## 2023-01-18 HISTORY — DX: Anemia, unspecified: D64.9

## 2023-01-18 HISTORY — DX: Gastro-esophageal reflux disease without esophagitis: K21.9

## 2023-01-18 HISTORY — DX: Depression, unspecified: F32.A

## 2023-01-18 NOTE — Patient Instructions (Addendum)
Your procedure is scheduled on:01-28-23 Monday Report to the Registration Desk on the 1st floor of the [facility].Then proceed to the 2nd floor Surgery Desk To find out your arrival time, please call [phone] between 1PM - 3PM on:01-25-23 Friday If your arrival time is 6:00 am, do not arrive before that time as the [facility] entrance doors do not open until 6:00 am.  REMEMBER: Instructions that are not followed completely may result in serious medical risk, up to and including death; or upon the discretion of your surgeon and anesthesiologist your surgery may need to be rescheduled.  Do not eat food OR drink any liquids after midnight the night before surgery.  No gum chewing or hard candies.  One week prior to surgery:Last dose will be on 01-20-23 Stop Anti-inflammatories (NSAIDS) such as Advil, Aleve, Ibuprofen, Motrin, Naproxen, Naprosyn and Aspirin based products such as Excedrin, Goody's Powder, BC Powder.You may however, take Tylenol if needed for pain up until the day of surgery. You may continue your celecoxib (CELEBREX) up until the day prior to surgery Stop ANY OVER THE COUNTER supplements/vitamins 7 days prior to surgery (Iron, Multivitamin)   Continue taking all prescribed medications with the exception of: -Stop your Ozempic 7 days prior to surgery-Last dose was on 01-18-23-Do NOT take again until AFTER your surgery  Do NOT take any medication the day of surgery  No Alcohol for 24 hours before or after surgery.  No Smoking including e-cigarettes for 24 hours before surgery.  No chewable tobacco products for at least 6 hours before surgery.  No nicotine patches on the day of surgery.  Do not use any "recreational" drugs for at least a week (preferably 2 weeks) before your surgery.  Please be advised that the combination of cocaine and anesthesia may have negative outcomes, up to and including death. If you test positive for cocaine, your surgery will be cancelled.  On  the morning of surgery brush your teeth with toothpaste and water, you may rinse your mouth with mouthwash if you wish. Do not swallow any toothpaste or mouthwash.  Do not wear jewelry, make-up, hairpins, clips or nail polish.  Do not wear lotions, powders, or perfumes.   Do not shave body hair from the neck down 48 hours before surgery.  Contact lenses, hearing aids and dentures may not be worn into surgery.  Do not bring valuables to the hospital. [facility] is not responsible for any missing/lost belongings or valuables.   Notify your doctor if there is any change in your medical condition (cold, fever, infection).  Wear comfortable clothing (specific to your surgery type) to the hospital.  After surgery, you can help prevent lung complications by doing breathing exercises.  Take deep breaths and cough every 1-2 hours. Your doctor may order a device called an Incentive Spirometer to help you take deep breaths. When coughing or sneezing, hold a pillow firmly against your incision with both hands. This is called "splinting." Doing this helps protect your incision. It also decreases belly discomfort.  If you are being admitted to the hospital overnight, leave your suitcase in the car. After surgery it may be brought to your room.  In case of increased patient census, it may be necessary for you, the patient, to continue your postoperative care in the Same Day Surgery department.  If you are being discharged the day of surgery, you will not be allowed to drive home. You will need a responsible individual to drive you home and  stay with you for 24 hours after surgery.   If you are taking public transportation, you will need to have a responsible individual with you.  Please call the Pre-admissions Testing Dept. at [phone] if you have any questions about these instructions.  Surgery Visitation Policy:  Patients having surgery or a procedure may have two visitors.  Children  under the age of 10 must have an adult with them who is not the patient.

## 2023-01-21 NOTE — Progress Notes (Signed)
  Perioperative Services Pre-Admission/Anesthesia Testing    Date: 01/21/23  Name: Priscilla King MRN:   [id number]  Re: GLP-1 clearance and provider recommendations   Planned Surgical Procedure(s):    Case: [id number] Date: 01/28/23   Procedures:      HYSTEROSCOPY D&C WITH ENDOMETRIAL ABLATION     HYSTEROSCOPIC MYOMECTOMY WITH MYOSURE   Anesthesia type: General   Pre-op diagnosis: Menorrhagia with regular cycle   Location: [facility]   Surgeons: Hildred Laser, MD      Clinical Notes:  Patient is scheduled for the above procedure with the indicated provider/surgeon. In review of her medication reconciliation it was noted that patient is on a prescribed GLP-1 medication. Per guidelines issued by the American Society of Anesthesiologists (ASA), it is recommended that these medications be held for 7 days prior to the patient undergoing any type of elective surgical procedure. The patient is taking the following GLP-1 medication:  [x]  SEMAGLUTIDE   []  EXENATIDE  []  LIRAGLUTIDE   []  LIXISENATIDE  []  DULAGLUTIDE     []  TIRZEPATIDE (GLP-1/GIP)  Reached out to prescribing provider (Copland, MD) to make them aware of the guidelines from anesthesia. Given that this patient takes the prescribed GLP-1 medication for her  diabetes diagnosis, rather than for weight loss, recommendations from the prescribing provider were solicited. Prescribing provider made aware of the following so that informed decision/POC can be developed for this patient that may be taking medications belonging to these drug classes:  Oral GLP-1 medications will be held 1 day prior to surgery.  Injectable GLP-1 medications will be held 7 days prior to surgery.  Metformin is routinely held 48 hours prior to surgery due to renal concerns, potential need for contrasted imaging perioperatively, and the potential for tissue hypoxia leading to drug induced lactic acidosis.  All SGLT2i medications are held  72 hours prior to surgery as they can be associated with the increased potential for developing euglycemic diabetic ketoacidosis (EDKA).   Impression and Plan:  Priscilla King is on a prescribed GLP-1 medication, which induces the known side effect of decreased gastric emptying. Efforts are bring made to mitigate the risk of perioperative hyperglycemic events, as elevated blood glucose levels have been found to contribute to intra/postoperative complications. Additionally, hyperglycemic extremes can potentially necessitate the postponing of a patient's elective case in order to better optimize perioperative glycemic control, again with the aforementioned guidelines in place. With this in mind, recommendations have been sought from the prescribing provider, who has cleared patient to proceed with holding the prescribed GLP-1 as per the guidelines from the ASA.   Provider recommending: no further recommendations received from the prescribing provider.  Copy of signed clearance and recommendations placed on patient's chart for inclusion in their medical record and for review by the surgical/anesthetic team on the day of her procedure.   Quentin Mulling, MSN, APRN, FNP-C, CEN [facility]  Peri-operative Services Nurse Practitioner Phone: [phone] 01/21/23 12:13 PM  NOTE: This note has been prepared using Dragon dictation software. Despite my best ability to proofread, there is always the potential that unintentional transcriptional errors may still occur from this process.

## 2023-01-23 ENCOUNTER — Encounter
Admission: RE | Admit: 2023-01-23 | Discharge: 2023-01-23 | Disposition: A | Discharge status: Home / Self Care | Payer: BC Managed Care – PPO | Source: Ambulatory Visit | Attending: Obstetrics and Gynecology | Admitting: Obstetrics and Gynecology

## 2023-01-23 ENCOUNTER — Inpatient Hospital Stay: Admission: RE | Admit: 2023-01-23 | Payer: BC Managed Care – PPO | Source: Ambulatory Visit

## 2023-01-23 DIAGNOSIS — Z01812 Encounter for preprocedural laboratory examination: Secondary | ICD-10-CM | POA: Diagnosis not present

## 2023-01-23 DIAGNOSIS — Z01818 Encounter for other preprocedural examination: Secondary | ICD-10-CM

## 2023-01-23 LAB — CBC
HCT: 40 % (ref 36.0–46.0)
Hemoglobin: 12.9 g/dL (ref 12.0–15.0)
MCH: 28.4 pg (ref 26.0–34.0)
MCHC: 32.3 g/dL (ref 30.0–36.0)
MCV: 88.1 fL (ref 80.0–100.0)
Platelets: 249 10*3/uL (ref 150–400)
RBC: 4.54 MIL/uL (ref 3.87–5.11)
RDW: 12.1 % (ref 11.5–15.5)
WBC: 4.8 10*3/uL (ref 4.0–10.5)
nRBC: 0 % (ref 0.0–0.2)

## 2023-01-25 NOTE — Pre-Procedure Instructions (Signed)
Patient made aware that she can have clear liquids up to 2 hours before arrival time per pre op, and dr. Valentino Saxon advise that she can have a diabetic ensure or boost at 5 am on the day of surgery.

## 2023-01-25 NOTE — Pre-Procedure Instructions (Signed)
Patient is diabetic and called with concern that she is NPO after Mn and her procedure is not until 1500, I sent Dr. Valentino Saxon a secure chat message with patients concerns.

## 2023-01-27 MED ORDER — FAMOTIDINE 20 MG PO TABS
20.0000 mg | ORAL_TABLET | Freq: Once | ORAL | Status: AC
  Administered 2023-01-28: 20 mg via ORAL

## 2023-01-27 MED ORDER — CELECOXIB 200 MG PO CAPS
400.0000 mg | ORAL_CAPSULE | ORAL | Status: AC
  Administered 2023-01-28: 400 mg via ORAL

## 2023-01-27 MED ORDER — LACTATED RINGERS IV SOLN: INTRAVENOUS | Status: DC

## 2023-01-27 MED ORDER — CHLORHEXIDINE GLUCONATE 0.12 % MT SOLN
15.0000 mL | Freq: Once | OROMUCOSAL | Status: AC
  Administered 2023-01-28: 15 mL via OROMUCOSAL
  Filled 2023-01-27: qty 15

## 2023-01-27 MED ORDER — ORAL CARE MOUTH RINSE: 15.0000 mL | Freq: Once | OROMUCOSAL | Status: AC

## 2023-01-27 MED ORDER — SODIUM CHLORIDE 0.9 % IV SOLN: INTRAVENOUS | Status: DC

## 2023-01-27 MED ORDER — ACETAMINOPHEN 500 MG PO TABS
1000.0000 mg | ORAL_TABLET | ORAL | Status: AC
  Administered 2023-01-28: 1000 mg via ORAL

## 2023-01-28 ENCOUNTER — Encounter
Admission: RE | Disposition: A | Discharge status: Home / Self Care | Payer: Self-pay | Source: Home / Self Care | Attending: Obstetrics and Gynecology

## 2023-01-28 ENCOUNTER — Ambulatory Visit: Payer: BC Managed Care – PPO | Admitting: Urgent Care

## 2023-01-28 ENCOUNTER — Encounter: Payer: Self-pay | Admitting: Obstetrics and Gynecology

## 2023-01-28 ENCOUNTER — Other Ambulatory Visit: Payer: Self-pay

## 2023-01-28 ENCOUNTER — Ambulatory Visit
Admission: RE | Admit: 2023-01-28 | Discharge: 2023-01-28 | Disposition: A | Discharge status: Home / Self Care | Payer: BC Managed Care – PPO | Attending: Obstetrics and Gynecology | Admitting: Obstetrics and Gynecology

## 2023-01-28 DIAGNOSIS — Z7984 Long term (current) use of oral hypoglycemic drugs: Secondary | ICD-10-CM | POA: Insufficient documentation

## 2023-01-28 DIAGNOSIS — E119 Type 2 diabetes mellitus without complications: Secondary | ICD-10-CM | POA: Insufficient documentation

## 2023-01-28 DIAGNOSIS — Z7985 Long-term (current) use of injectable non-insulin antidiabetic drugs: Secondary | ICD-10-CM | POA: Diagnosis not present

## 2023-01-28 DIAGNOSIS — D250 Submucous leiomyoma of uterus: Secondary | ICD-10-CM | POA: Diagnosis not present

## 2023-01-28 DIAGNOSIS — Z6832 Body mass index (BMI) 32.0-32.9, adult: Secondary | ICD-10-CM | POA: Diagnosis not present

## 2023-01-28 DIAGNOSIS — N920 Excessive and frequent menstruation with regular cycle: Secondary | ICD-10-CM | POA: Diagnosis not present

## 2023-01-28 DIAGNOSIS — Z8616 Personal history of COVID-19: Secondary | ICD-10-CM | POA: Diagnosis not present

## 2023-01-28 DIAGNOSIS — K219 Gastro-esophageal reflux disease without esophagitis: Secondary | ICD-10-CM | POA: Insufficient documentation

## 2023-01-28 DIAGNOSIS — Z01818 Encounter for other preprocedural examination: Secondary | ICD-10-CM

## 2023-01-28 DIAGNOSIS — Z09 Encounter for follow-up examination after completed treatment for conditions other than malignant neoplasm: Secondary | ICD-10-CM | POA: Diagnosis not present

## 2023-01-28 DIAGNOSIS — D259 Leiomyoma of uterus, unspecified: Secondary | ICD-10-CM | POA: Diagnosis not present

## 2023-01-28 DIAGNOSIS — E785 Hyperlipidemia, unspecified: Secondary | ICD-10-CM | POA: Diagnosis not present

## 2023-01-28 DIAGNOSIS — N840 Polyp of corpus uteri: Secondary | ICD-10-CM | POA: Insufficient documentation

## 2023-01-28 HISTORY — PX: DILITATION & CURRETTAGE/HYSTROSCOPY WITH NOVASURE ABLATION: SHX5568

## 2023-01-28 HISTORY — PX: DILATATION & CURETTAGE/HYSTEROSCOPY WITH MYOSURE: SHX6511

## 2023-01-28 LAB — POCT PREGNANCY, URINE: Preg Test, Ur: NEGATIVE

## 2023-01-28 LAB — GLUCOSE, CAPILLARY: Glucose-Capillary: 108 mg/dL — ABNORMAL HIGH (ref 70–99)

## 2023-01-28 SURGERY — DILATATION & CURETTAGE/HYSTEROSCOPY WITH NOVASURE ABLATION
Anesthesia: General | Site: Uterus | Wound class: Clean Contaminated

## 2023-01-28 MED ORDER — MIDAZOLAM HCL 2 MG/2ML IJ SOLN
INTRAMUSCULAR | Status: DC | PRN
  Administered 2023-01-28: 2 mg via INTRAVENOUS

## 2023-01-28 MED ORDER — ONDANSETRON HCL 4 MG/2ML IJ SOLN
INTRAMUSCULAR | Status: DC | PRN
  Administered 2023-01-28: 4 mg via INTRAVENOUS

## 2023-01-28 MED ORDER — OXYCODONE HCL 5 MG/5ML PO SOLN: 5.0000 mg | Freq: Once | ORAL | Status: DC | PRN

## 2023-01-28 MED ORDER — CHLORHEXIDINE GLUCONATE 0.12 % MT SOLN
OROMUCOSAL | Status: AC
  Filled 2023-01-28: qty 15

## 2023-01-28 MED ORDER — OXYCODONE HCL 5 MG PO TABS: 5.0000 mg | ORAL_TABLET | Freq: Once | ORAL | Status: DC | PRN

## 2023-01-28 MED ORDER — FENTANYL CITRATE (PF) 100 MCG/2ML IJ SOLN
INTRAMUSCULAR | Status: DC | PRN
  Administered 2023-01-28 (×2): 50 ug via INTRAVENOUS

## 2023-01-28 MED ORDER — IBUPROFEN 600 MG PO TABS: 600.0000 mg | ORAL_TABLET | Freq: Four times a day (QID) | ORAL | 1 refills | Status: DC | PRN

## 2023-01-28 MED ORDER — ACETAMINOPHEN-CODEINE 300-30 MG PO TABS: 1.0000 | ORAL_TABLET | ORAL | 0 refills | Status: DC | PRN

## 2023-01-28 MED ORDER — LIDOCAINE HCL (CARDIAC) PF 100 MG/5ML IV SOSY
PREFILLED_SYRINGE | INTRAVENOUS | Status: DC | PRN
  Administered 2023-01-28: 100 mg via INTRAVENOUS

## 2023-01-28 MED ORDER — MIDAZOLAM HCL 2 MG/2ML IJ SOLN
INTRAMUSCULAR | Status: AC
  Filled 2023-01-28: qty 2

## 2023-01-28 MED ORDER — ACETAMINOPHEN 10 MG/ML IV SOLN: INTRAVENOUS | Status: DC | PRN

## 2023-01-28 MED ORDER — ACETAMINOPHEN 500 MG PO TABS
ORAL_TABLET | ORAL | Status: AC
  Filled 2023-01-28: qty 2

## 2023-01-28 MED ORDER — ROCURONIUM BROMIDE 100 MG/10ML IV SOLN
INTRAVENOUS | Status: DC | PRN
  Administered 2023-01-28: 10 mg via INTRAVENOUS
  Administered 2023-01-28: 50 mg via INTRAVENOUS

## 2023-01-28 MED ORDER — FAMOTIDINE 20 MG PO TABS
ORAL_TABLET | ORAL | Status: AC
  Filled 2023-01-28: qty 1

## 2023-01-28 MED ORDER — CELECOXIB 200 MG PO CAPS
ORAL_CAPSULE | ORAL | Status: AC
  Filled 2023-01-28: qty 2

## 2023-01-28 MED ORDER — PROPOFOL 10 MG/ML IV BOLUS
INTRAVENOUS | Status: AC
  Filled 2023-01-28: qty 20

## 2023-01-28 MED ORDER — SUGAMMADEX SODIUM 200 MG/2ML IV SOLN
INTRAVENOUS | Status: DC | PRN
  Administered 2023-01-28: 200 mg via INTRAVENOUS

## 2023-01-28 MED ORDER — FENTANYL CITRATE (PF) 100 MCG/2ML IJ SOLN
INTRAMUSCULAR | Status: AC
  Filled 2023-01-28: qty 2

## 2023-01-28 MED ORDER — PROPOFOL 10 MG/ML IV BOLUS
INTRAVENOUS | Status: DC | PRN
  Administered 2023-01-28: 200 mg via INTRAVENOUS
  Administered 2023-01-28: 50 mg via INTRAVENOUS

## 2023-01-28 MED ORDER — FENTANYL CITRATE (PF) 100 MCG/2ML IJ SOLN: 25.0000 ug | INTRAMUSCULAR | Status: DC | PRN

## 2023-01-28 MED ORDER — 0.9 % SODIUM CHLORIDE (POUR BTL) OPTIME
TOPICAL | Status: DC | PRN
  Administered 2023-01-28: 500 mL

## 2023-01-28 MED ORDER — DEXAMETHASONE SODIUM PHOSPHATE 10 MG/ML IJ SOLN
INTRAMUSCULAR | Status: DC | PRN
  Administered 2023-01-28: 10 mg via INTRAVENOUS

## 2023-01-28 MED ORDER — DEXMEDETOMIDINE HCL IN NACL 200 MCG/50ML IV SOLN
INTRAVENOUS | Status: DC | PRN
  Administered 2023-01-28 (×2): 8 ug via INTRAVENOUS

## 2023-01-28 SURGICAL SUPPLY — 29 items
ABLATOR SURESOUND NOVASURE (ABLATOR) IMPLANT
DEVICE MYOSURE LITE (MISCELLANEOUS) IMPLANT
DEVICE MYOSURE REACH (MISCELLANEOUS) IMPLANT
DRSG TELFA 3X8 NADH STRL (GAUZE/BANDAGES/DRESSINGS) IMPLANT
ELECT REM PT RETURN 9FT ADLT (ELECTROSURGICAL) ×1
ELECTRODE REM PT RTRN 9FT ADLT (ELECTROSURGICAL) ×1 IMPLANT
GLOVE BIO SURGEON STRL SZ 6.5 (GLOVE) ×1 IMPLANT
GLOVE INDICATOR 7.0 STRL GRN (GLOVE) ×1 IMPLANT
GOWN STRL REUS W/ TWL LRG LVL3 (GOWN DISPOSABLE) ×2 IMPLANT
GOWN STRL REUS W/TWL LRG LVL3 (GOWN DISPOSABLE) ×2
HANDPIECE ABLA MINERVA ENDO (MISCELLANEOUS) IMPLANT
IV LACTATED RINGERS 1000ML (IV SOLUTION) ×1 IMPLANT
IV NS IRRIG 3000ML ARTHROMATIC (IV SOLUTION) ×1 IMPLANT
KIT PROCEDURE FLUENT (KITS) IMPLANT
KIT TURNOVER CYSTO (KITS) ×1 IMPLANT
LABEL OR SOLS (LABEL) ×1 IMPLANT
MANIFOLD NEPTUNE II (INSTRUMENTS) ×1 IMPLANT
NDL HYPO 22X1.5 SAFETY MO (MISCELLANEOUS) IMPLANT
NEEDLE HYPO 22X1.5 SAFETY MO (MISCELLANEOUS) IMPLANT
NS IRRIG 500ML POUR BTL (IV SOLUTION) ×1 IMPLANT
PACK DNC HYST (MISCELLANEOUS) ×1 IMPLANT
PAD PREP OB/GYN DISP 24X41 (PERSONAL CARE ITEMS) ×1 IMPLANT
SCRUB CHG 4% DYNA-HEX 4OZ (MISCELLANEOUS) ×1 IMPLANT
SEAL ROD LENS SCOPE MYOSURE (ABLATOR) ×1 IMPLANT
SET CYSTO W/LG BORE CLAMP LF (SET/KITS/TRAYS/PACK) IMPLANT
SYR 10ML LL (SYRINGE) IMPLANT
TOWEL OR 17X26 4PK STRL BLUE (TOWEL DISPOSABLE) ×1 IMPLANT
TRAP FLUID SMOKE EVACUATOR (MISCELLANEOUS) ×1 IMPLANT
WATER STERILE IRR 500ML POUR (IV SOLUTION) ×1 IMPLANT

## 2023-01-28 NOTE — Anesthesia Preprocedure Evaluation (Signed)
Anesthesia Evaluation  Patient identified by MRN, date of birth, ID band Patient awake    Reviewed: Allergy & Precautions, NPO status , Patient's Chart, lab work & pertinent test results  History of Anesthesia Complications Negative for: history of anesthetic complications  Airway Mallampati: III  TM Distance: >3 FB Neck ROM: full    Dental no notable dental hx.    Pulmonary neg pulmonary ROS   Pulmonary exam normal        Cardiovascular negative cardio ROS Normal cardiovascular exam     Neuro/Psych  PSYCHIATRIC DISORDERS  Depression    negative neurological ROS     GI/Hepatic Neg liver ROS,GERD  Controlled,,  Endo/Other  negative endocrine ROSdiabetes, Type 2    Renal/GU      Musculoskeletal   Abdominal   Peds  Hematology negative hematology ROS (+)   Anesthesia Other Findings Past Medical History: No date: Anemia No date: Arthritis 01/13/2023: COVID-19     Comment:  symptoms completely resolved as of 01-18-23 No date: Depression 06/2014: Diabetes mellitus type 2, uncontrolled, without complications No date: GERD (gastroesophageal reflux disease) No date: History of polycystic ovaries 06/2014: Hyperlipidemia LDL goal <70 No date: Kienbock's avascular necrosis of lunate, adult No date: Menorrhagia with regular cycle 08/14/2021: Morbid obesity with BMI of 40.0-44.9, adult [facility])  Past Surgical History: 08/31/2012: CESAREAN SECTION; N/A     Comment:  Procedure: CESAREAN SECTION;  Surgeon: Catalina Antigua,               MD;  Location: [facility];  Service: Obstetrics;  Laterality:              N/A; 03/20/2017: CESAREAN SECTION; N/A     Comment:  Procedure: REPEAT CESAREAN SECTION;  Surgeon: Hildred Laser, MD;  Location: [facility] ORS;  Service: Obstetrics;                Laterality: N/A;  Female born @ 1500 Apgars:               8/9 Weight:10lbs 9ozs 11/01/2021: COLONOSCOPY WITH PROPOFOL; N/A      Comment:  Procedure: COLONOSCOPY WITH PROPOFOL;  Surgeon: Wyline Mood, MD;  Location: [facility] ENDOSCOPY;  Service:               Gastroenterology;  Laterality: N/A;  BMI    Body Mass Index: 32.54 kg/m      Reproductive/Obstetrics negative OB ROS                              Anesthesia Physical Anesthesia Plan  ASA: 2  Anesthesia Plan: General ETT   Post-op Pain Management: Tylenol PO (pre-op)* and Celebrex PO (pre-op)*   Induction: Intravenous  PONV Risk Score and Plan: 3 and Ondansetron, Dexamethasone, Midazolam and Treatment may vary due to age or medical condition  Airway Management Planned: Oral ETT  Additional Equipment:   Intra-op Plan:   Post-operative Plan: Extubation in OR  Informed Consent: I have reviewed the patients History and Physical, chart, labs and discussed the procedure including the risks, benefits and alternatives for the proposed anesthesia with the patient or authorized representative who has indicated his/her understanding and acceptance.     Dental Advisory Given  Plan Discussed with: Anesthesiologist, CRNA and Surgeon  Anesthesia Plan Comments: (Patient consented  for risks of anesthesia including but not limited to:  - adverse reactions to medications - damage to eyes, teeth, lips or other oral mucosa - nerve damage due to positioning  - sore throat or hoarseness - Damage to heart, brain, nerves, lungs, other parts of body or loss of life  Patient voiced understanding.)         Anesthesia Quick Evaluation

## 2023-01-28 NOTE — H&P (Signed)
GYNECOLOGY PREOPERATIVE HISTORY AND PHYSICAL   Subjective:  Priscilla King is a 47 y.o. [id number] here for surgical management of menorrhagia and fibroid uterus.  She has a history of menorrhagia for the past 1.5- 2 years with passage of large clots, cycles lasting 10-12 days, changing pads q 45 minutes during heaviest days. Has tried OCPs, IUD in the past for contraception. Currently on Provera 10 mg for management of most recent cycle. No significant preoperative concerns.  Proposed surgery: Hysteroscopic Myomectomy with Myosure, Endometrial Ablation (Novasure), Dilation and Curettage.    Pertinent Gynecological History: Menses: see above HPI. Bleeding: dysfunctional uterine bleeding Contraception: none Last mammogram: normal Date: 04/13/2022 Last pap: normal Date: 12/11/2022.   Past Medical History:  Diagnosis Date   Anemia    Arthritis    COVID-19 01/13/2023   symptoms completely resolved as of 01-18-23   Depression    Diabetes mellitus type 2, uncontrolled, without complications 06/2014   GERD (gastroesophageal reflux disease)    History of polycystic ovaries    Hyperlipidemia LDL goal <70 06/2014   Kienbock's avascular necrosis of lunate, adult    Menorrhagia with regular cycle    Morbid obesity with BMI of 40.0-44.9, adult (HCC) 08/14/2021    Past Surgical History:  Procedure Laterality Date   CESAREAN SECTION N/A 08/31/2012   Procedure: CESAREAN SECTION;  Surgeon: Catalina Antigua, MD;  Location: [facility];  Service: Obstetrics;  Laterality: N/A;   CESAREAN SECTION N/A 03/20/2017   Procedure: REPEAT CESAREAN SECTION;  Surgeon: Hildred Laser, MD;  Location: [facility] ORS;  Service: Obstetrics;  Laterality: N/A;  Female born @ 1500 Apgars: 8/9 Weight:10lbs 9ozs   COLONOSCOPY WITH PROPOFOL N/A 11/01/2021   Procedure: COLONOSCOPY WITH PROPOFOL;  Surgeon: Wyline Mood, MD;  Location: [facility] ENDOSCOPY;  Service: Gastroenterology;  Laterality: N/A;    OB History  Gravida Para Term  Preterm AB Living  3 2 2   1 2   SAB IAB Ectopic Multiple Live Births    1     2    # Outcome Date GA Lbr Len/2nd Weight Sex Delivery Anes PTL Lv  3 Term 03/20/17 [redacted]w[redacted]d  4790 g F CS-Vac Spinal  LIV  2 Term 08/31/12 [redacted]w[redacted]d  3980 g M CS-LVertical EPI  LIV  1 IAB 1998    U    DEC     Birth Comments: Elective AB    Family History  Problem Relation Age of Onset   Stroke Mother        CVA (TIA)   Diabetes Mother    Healthy Sister    Healthy Daughter    Diabetes Maternal Grandmother    Hypertension Maternal Grandmother    Diabetes Paternal Grandmother    Hypertension Paternal Grandmother    Healthy Brother    Healthy Brother    Healthy Son    Breast cancer Neg Hx     Social History   Socioeconomic History   Marital status: Married    Spouse name: Not on file   Number of children: 1   Years of education: Not on file   Highest education level: Doctorate  Occupational History   Occupation: Professor    Employer: [employer]    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  (10/31/2022)   Overall Financial Resource Strain (CARDIA)    Difficulty of Paying Living Expenses: Not hard at all  Food Insecurity: No Food Insecurity (10/31/2022)   Hunger Vital Sign    Worried About Running Out of Food in the Last Year: Never true    Ran Out of Food in the Last Year: Never true  Transportation Needs: No Transportation Needs (10/31/2022)   PRAPARE - Therapist, art (Medical): No    Lack of Transportation (Non-Medical): No  Physical Activity: Insufficiently Active (10/31/2022)   Exercise Vital Sign    Days of Exercise per Week: 3 days    Minutes of Exercise per Session: 40 min  Stress: Stress Concern Present (10/31/2022)   [employer] of Occupational Health - Occupational Stress Questionnaire    Feeling of Stress : To some extent  Social Connections: Moderately Integrated (10/31/2022)   Social Connection and Isolation Panel [NHANES]    Frequency of Communication with Friends and Family: More than three times a week    Frequency of Social Gatherings with Friends and Family: Once a week    Attends Religious Services: Never    Database administrator or Organizations: Yes    Attends Engineer, structural: More than 4 times per year    Marital Status: Married  Catering manager Violence: Not on file    No current facility-administered medications on file prior to encounter.   Current Outpatient Medications on File Prior to Encounter  Medication Sig Dispense Refill   amitriptyline (ELAVIL) 10 MG tablet TAKE 1 TO 2 TABLETS (10-20 MG TOTAL) BY MOUTH AT BEDTIME (Patient taking differently: Take 10-20 mg by mouth at bedtime as needed.) 180 tablet 0   celecoxib (CELEBREX) 200 MG capsule TAKE 1 CAPSULE BY MOUTH TWICE A DAY 180 capsule 1   medroxyPROGESTERone (PROVERA) 10 MG tablet Take 1 tablet (10 mg total) by mouth daily. Use for ten days, start on Day 1 of cycle. 10 tablet 0   Multiple Vitamin (MULTIVITAMIN ADULT PO) Take 1 tablet by mouth daily at 6 (six) AM.     rosuvastatin (CRESTOR) 20 MG tablet Take 1 tablet (20 mg total) by mouth daily. (Patient taking differently: Take 20 mg by mouth at bedtime.) 90 tablet 3   sertraline (ZOLOFT) 100 MG tablet Take 1 tablet (100 mg total) by mouth daily. (Patient taking differently: Take 100 mg by mouth at bedtime.) 90 tablet 1   Continuous Blood Gluc Sensor (FREESTYLE LIBRE 2  SENSOR) MISC Place 1 application onto the skin every 14 (fourteen) days. 2 each 2   diclofenac sodium (VOLTAREN) 1 % GEL Apply 2 g topically 4 (four) times daily. (Patient taking differently: Apply 2 g topically as needed.) 3 Tube 5   Semaglutide, 2 MG/DOSE, (OZEMPIC, 2 MG/DOSE,) 8 MG/3ML SOPN INJECT 2 MG AS DIRECTED ONCE A WEEK. (Patient taking differently: Inject 2 mg as directed once a week. INJECT 2 MG AS DIRECTED ONCE A WEEK.-Fridays) 3 mL 0    Allergies  Allergen Reactions   Vicodin [Hydrocodone-Acetaminophen] Swelling  Tongue swelling and headache     Review of Systems Constitutional: No recent fever/chills/sweats Respiratory: No recent cough/bronchitis Cardiovascular: No chest pain Gastrointestinal: No recent nausea/vomiting/diarrhea Genitourinary: No UTI symptoms Hematologic/lymphatic:No history of coagulopathy or recent blood thinner use    Objective:   Blood pressure (!) 139/95, pulse 85, temperature 97.7 F (36.5 C), temperature source Oral, resp. rate 16, height 5\' 8"  (1.727 m), weight 97.1 kg, last menstrual period 01/18/2023, SpO2 100 %. CONSTITUTIONAL: Well-developed, well-nourished female in no acute distress.  HENT:  Normocephalic, atraumatic, External right and left ear normal. Oropharynx is clear and moist EYES: Conjunctivae and EOM are normal. Pupils are equal, round, and reactive to light. No scleral icterus.  NECK: Normal range of motion, supple, no masses SKIN: Skin is warm and dry. No rash noted. Not diaphoretic. No erythema. No pallor. NEUROLOGIC: Alert and oriented to person, place, and time. Normal reflexes, muscle tone coordination. No cranial nerve deficit noted. PSYCHIATRIC: Normal mood and affect. Normal behavior. Normal judgment and thought content. CARDIOVASCULAR: Normal heart rate noted, regular rhythm RESPIRATORY: Effort and breath sounds normal, no problems with respiration noted ABDOMEN: Soft, nontender, nondistended. PELVIC:  Deferred MUSCULOSKELETAL: Normal range of motion. No edema and no tenderness. 2+ distal pulses.    Labs: Results for orders placed or performed during the hospital encounter of 01/28/23 (from the past 336 hour(s))  Glucose, capillary   Collection Time: 01/28/23  2:18 PM  Result Value Ref Range   Glucose-Capillary 108 (H) 70 - 99 mg/dL  Pregnancy, urine POC   Collection Time: 01/28/23  2:19 PM  Result Value Ref Range   Preg Test, Ur NEGATIVE NEGATIVE  Results for orders placed or performed during the hospital encounter of 01/23/23 (from the past 336 hour(s))  CBC   Collection Time: 01/23/23  9:33 AM  Result Value Ref Range   WBC 4.8 4.0 - 10.5 K/uL   RBC 4.54 3.87 - 5.11 MIL/uL   Hemoglobin 12.9 12.0 - 15.0 g/dL   HCT 56.2 13.0 - 86.5 %   MCV 88.1 80.0 - 100.0 fL   MCH 28.4 26.0 - 34.0 pg   MCHC 32.3 30.0 - 36.0 g/dL   RDW 78.4 69.6 - 29.5 %   Platelets 249 150 - 400 K/uL   nRBC 0.0 0.0 - 0.2 %     Imaging Studies: US PELVIC COMPLETE WITH TRANSVAGINAL  Result Date: 01/09/2023 CLINICAL DATA:  Menorrhagia.  Dysfunctional uterine bleeding. EXAM: TRANSABDOMINAL AND TRANSVAGINAL ULTRASOUND OF PELVIS TECHNIQUE: Both transabdominal and transvaginal ultrasound examinations of the pelvis were performed. Transabdominal technique was performed for global imaging of the pelvis including uterus, ovaries, adnexal regions, and pelvic cul-de-sac. It was necessary to proceed with endovaginal exam following the transabdominal exam to visualize the endometrium. COMPARISON:  None Available. FINDINGS: Uterus Measurements: 11.8 x 6.4 x 7.6 cm = volume: 296.8 mL. There is a 5.8 x 6.2 x 5.7 cm fibroid within the right uterine fundus. Fibroid exerts mass-effect on the endometrium. Endometrium Thickness: 3.9 mm.  No focal abnormality visualized. Right ovary Measurements: 3.0 x 2.1 x 1.6 cm = volume: 5.3 mL. Normal appearance/no adnexal mass. Left ovary Measurements: 3.4 x 1.7 x 2.1 cm = volume: 6.5 mL.  Normal appearance/no adnexal mass. Other findings No abnormal free fluid. IMPRESSION: 1. Fibroid uterus. Large fibroid exerts mass-effect on the endometrium. 2. Endometrium measures 3.9 mm. If bleeding remains unresponsive to hormonal or medical therapy, sonohysterogram should be considered for focal lesion work-up. (Ref: Radiological Reasoning: Algorithmic Workup of Abnormal Vaginal  Bleeding with Endovaginal Sonography and Sonohysterography. AJR 2008; 161:W96-04) Electronically Signed   By: Annia Belt M.D.   On: 01/09/2023 19:11     Assessment:    Menorrhagia  Fibroid uterus DM Type II   Plan:   Counseling: Procedure, risks, reasons, benefits and complications (including injury to bowel, bladder, major blood vessel, ureter, bleeding, possibility of transfusion, infection, or fistula formation) reviewed in detail. Likelihood of success in alleviating the patient's condition was discussed. Routine postoperative instructions will be reviewed with the patient and her family in detail after surgery.  The patient concurred with the proposed plan, giving informed written consent for the surgery.   Preop testing ordered. Remains NPO since 0500.    Hildred Laser, MD [facility]

## 2023-01-28 NOTE — Discharge Instructions (Signed)

## 2023-01-28 NOTE — Anesthesia Procedure Notes (Signed)
Procedure Name: Intubation Date/Time: 01/28/2023 4:00 PM  Performed by: Joanette Gula, Ileigh Mettler, CRNAPre-anesthesia Checklist: Patient identified, Emergency Drugs available, Suction available and Patient being monitored Patient Re-evaluated:Patient Re-evaluated prior to induction Oxygen Delivery Method: Circle system utilized Preoxygenation: Pre-oxygenation with 100% oxygen Induction Type: IV induction Ventilation: Mask ventilation without difficulty Grade View: Grade I Tube type: Oral Tube size: 7.0 mm Number of attempts: 1 Airway Equipment and Method: Stylet and Oral airway Placement Confirmation: ETT inserted through vocal cords under direct vision, positive ETCO2 and breath sounds checked- equal and bilateral Tube secured with: Tape Dental Injury: Teeth and Oropharynx as per pre-operative assessment

## 2023-01-28 NOTE — Transfer of Care (Signed)
Immediate Anesthesia Transfer of Care Note  Patient: Priscilla King  Procedure(s) Performed: HYSTEROSCOPY D&C WITH ENDOMETRIAL ABLATION (Uterus) DILATATION & CURETTAGE/HYSTEROSCOPY WITH MYOSURE (Uterus)  Patient Location: PACU  Anesthesia Type:General  Level of Consciousness: drowsy and responds to stimulation  Airway & Oxygen Therapy: Patient Spontanous Breathing and Patient connected to face mask oxygen  Post-op Assessment: Report given to RN and Post -op Vital signs reviewed and stable  Post vital signs: Reviewed and stable  Last Vitals:  Vitals Value Taken Time  BP 122/75 01/28/23 1717  Temp    Pulse 90 01/28/23 1720  Resp 21 01/28/23 1720  SpO2 100 % 01/28/23 1720  Vitals shown include unvalidated device data.  Last Pain:  Vitals:   01/28/23 1422  TempSrc: Oral  PainSc: 0-No pain         Complications: No notable events documented.

## 2023-01-28 NOTE — Op Note (Signed)
Procedure(s): HYSTEROSCOPY WITH NOVASURE ENDOMETRIAL ABLATION MYOMECTOMY WITH  MYOSURE ENDOMETRIAL POLYPECTOMY  Procedure Note  Priscilla King female 47 y.o. 01/28/2023  Indications: The patient is a 47 y.o. [id number] female with fibroid uterus, menorrhagia with regular cycle  Pre-operative Diagnosis: Fibroid uterus, menorrhagia with regular cycle  Post-operative Diagnosis: Same, with endometrial polyp  Surgeon: Hildred Laser, MD  Assistants:  Surgical scrub tech.   Anesthesia: General endotracheal anesthesia  Findings: Uterus sounded to 9 cm.  Cervical length 2 cm.  Weakly proliferative endometrium.  Tubal ostia were/were not visualized bilaterally.  Mass effect from suspected submucosal fibroid noted on posterior surface of the uterus at lower uterine segment.  Possible polyp noted on anterior fundal region.  Total ablation time 31 seconds for initial ablation, 39 seconds for repeat ablation.    Adequate charring of endometrial tissue post ablation.  No perforations noted.   Procedure Details: The patient was seen in the Holding Room. The risks, benefits, complications, treatment options, and expected outcomes were discussed with the patient.  The patient concurred with the proposed plan, giving informed consent.  The site of surgery properly noted/marked. The patient was taken to the Operating Room, identified as Priscilla King and the procedure verified as Procedure(s) (LRB): HYSTEROSCOPY D&C WITH ENDOMETRIAL ABLATION (N/A) HYSTEROSCOPIC MYOMECTOMY WITH MYOSURE (N/A). A Time Out was held and the above information confirmed.   The patient was then placed under general anesthesia without difficulty.  She was then prepped and draped in the normal sterile fashion, and placed in the dorsal lithotomy position.  A time out was performed.  An exam under anesthesia was performed with the findings noted above.  Straight catheterization was performed. A sterile speculum was inserted into  vagina. A single-tooth tenaculum was used to grasp the anterior lip of the cervix. Cervical dilation was performed. A 5 mm hysteroscope was introduced into the uterus under direct visualization. The cavity was allowed to fill, and then the entire cavity was explored with the findings described above. The Myosure device was inserted into the uterine cavity.  A polypectomy was performed. Next, a partial myomectomy was performed of the submucosal posterior fibroid.    They Myosure device was then removed from the uterine cavity and the hysteroscope was removed.  The NovaSure instrument was primed per instructions. The instrument was then placed into the endometrial canal and activated.  Total ablation time was 31 sec.  The NovaSure instrument was then removed from the uterine cavity.  The hysteroscope was then re-introduced for final survey, with charring of  only the lower uterine segment and cervix noted. The hysteroscope was then removed and a new Novasure instrument was iinserted into the endometrial canal and activated. Total ablation time was 39 seconds The device was then removed and the hysteroscope was reintroduced into the uterine cavity. This time adequate charring of the entire endometrium noted.  No perforations were observed. The hysteroscope was removed from the patient's uterine cavity. The tenaculum was removed and excellent hemostasis was noted. The speculum was removed from the vagina.   All instrument and sponge counts were correct at the end of the procedure x 2.  The patient tolerated the procedure well.  She was awakened and taken to the PACU in stable condition.    Estimated Blood Loss:  10 ml      Drains: straight catheterization prior to procedure with  30 ml of clear urine         Total IV Fluids: 1000 ml  Fluid  Deficit: 930 ml  Specimens: Fragments of endometrial polyp with submucosal fibroid          Implants: None         Complications:  None; patient tolerated the  procedure well.         Disposition: PACU - hemodynamically stable.         Condition: stable   Hildred Laser, MD [facility]

## 2023-01-28 NOTE — Anesthesia Postprocedure Evaluation (Signed)
Anesthesia Post Note  Patient: Priscilla King  Procedure(s) Performed: HYSTEROSCOPY D&C WITH ENDOMETRIAL ABLATION (Uterus) DILATATION & CURETTAGE/HYSTEROSCOPY WITH MYOSURE (Uterus)  Patient location during evaluation: PACU Anesthesia Type: General Level of consciousness: awake and alert Pain management: pain level controlled Vital Signs Assessment: post-procedure vital signs reviewed and stable Respiratory status: spontaneous breathing, nonlabored ventilation, respiratory function stable and patient connected to nasal cannula oxygen Cardiovascular status: blood pressure returned to baseline and stable Postop Assessment: no apparent nausea or vomiting Anesthetic complications: no  No notable events documented.   Last Vitals:  Vitals:   01/28/23 1730 01/28/23 1745  BP: 126/78 123/79  Pulse: 94 80  Resp: 16 18  Temp:    SpO2: 99% 100%    Last Pain:  Vitals:   01/28/23 1717  TempSrc:   PainSc: Asleep                 Stephanie Coup

## 2023-01-29 ENCOUNTER — Encounter: Payer: Self-pay | Admitting: Obstetrics and Gynecology

## 2023-01-30 DIAGNOSIS — F331 Major depressive disorder, recurrent, moderate: Secondary | ICD-10-CM | POA: Diagnosis not present

## 2023-01-30 DIAGNOSIS — F411 Generalized anxiety disorder: Secondary | ICD-10-CM | POA: Diagnosis not present

## 2023-01-31 ENCOUNTER — Encounter: Payer: Self-pay | Admitting: Obstetrics and Gynecology

## 2023-02-07 ENCOUNTER — Ambulatory Visit (INDEPENDENT_AMBULATORY_CARE_PROVIDER_SITE_OTHER): Payer: BC Managed Care – PPO | Admitting: Obstetrics and Gynecology

## 2023-02-07 ENCOUNTER — Encounter: Payer: BC Managed Care – PPO | Admitting: Obstetrics and Gynecology

## 2023-02-07 ENCOUNTER — Encounter: Payer: Self-pay | Admitting: Obstetrics and Gynecology

## 2023-02-07 VITALS — BP 122/79 | HR 80 | Ht 68.0 in | Wt 214.6 lb

## 2023-02-07 DIAGNOSIS — Z4889 Encounter for other specified surgical aftercare: Secondary | ICD-10-CM

## 2023-02-07 DIAGNOSIS — D250 Submucous leiomyoma of uterus: Secondary | ICD-10-CM

## 2023-02-07 DIAGNOSIS — R109 Unspecified abdominal pain: Secondary | ICD-10-CM

## 2023-02-07 DIAGNOSIS — N9089 Other specified noninflammatory disorders of vulva and perineum: Secondary | ICD-10-CM

## 2023-02-07 NOTE — Progress Notes (Signed)
    OBSTETRICS/GYNECOLOGY POST-OPERATIVE CLINIC VISIT  Subjective:     Priscilla King is a 47 y.o. female who presents to the clinic 1 weeks status post HYSTEROSCOPY D&C WITH ENDOMETRIAL ABLATION DILATATION & CURETTAGE/HYSTEROSCOPY WITH MYOSURE  for Menorrhagia with regular cycle . Eating a regular diet without difficulty. Bowel movements are abnormal with mild constipation . Pain is somewhat controlled with current analgesics. Notes an achiness in her pelvis. She has a bad pain on her right side that makes it uncomfortable to sit. Medications being used: prescription NSAID's including celecoxib (Celebrex).   Patient also still noting some external vulvar irritation. Notes that it was present prior to surgery. Has tried changing soaps and feminine products which work for a short time, but then her symptoms recur.   The following portions of the patient's history were reviewed and updated as appropriate: allergies, current medications, past family history, past medical history, past social history, past surgical history, and problem list.  Review of Systems Pertinent items are noted in HPI.   Objective:   BP 122/79   Pulse 80   Ht 5\' 8"  (1.727 m)   Wt 214 lb 9.6 oz (97.3 kg)   LMP 01/18/2023 (Exact Date)   BMI 32.63 kg/m  Body mass index is 32.63 kg/m.  General:  alert and no distress  Abdomen: soft, bowel sounds active, non-tender  Pelvis:   external genitalia normal, rectovaginal septum normal.  Vagina without discharge.  Cervix normal appearing, no lesions and no motion tenderness.  Scant watery discharge noted from cervical os. Uterus mobile, nontender, normal shape and size.  Adnexae non-palpable, nontender bilaterally.     Pathology:   Uterine fibroid(s) - FRAGMENTS OF BENIGN ENDOMETRIAL POLYP(S) - FRAGMENTS OF SMOOTH MUSCLE CONSISTENT WITH LEIOMYOMATA - NEGATIVE FOR HYPERPLASIA OR MALIGNANCY  Assessment:   Patient s/p HYSTEROSCOPY D&C WITH ENDOMETRIAL ABLATION  DILATATION & CURETTAGE/HYSTEROSCOPY WITH MYOSURE  Abdominal discomfort Vulvar irritation  Plan:   1. Continue any current medications as instructed by provider. Also advised to alternate with Tylenol.  Is noting some constipation, now taking Miralax.  2. Wound care discussed. 3. Operative findings again reviewed. Pathology report discussed. Relieved recovery expectations.  4. Activity restrictions: none 5. Anticipated return to work: not applicable. 6. Vulvar irritation treatments discussed, including natural topical agents such as coconut oil, tea tree oil, or hydrocortisone cream over the counter.  7. Follow up: 3  months to repeat US for uterine fibroid.  To follow up sooner if symptoms worsen.     Hildred Laser, MD [facility]

## 2023-02-11 DIAGNOSIS — F331 Major depressive disorder, recurrent, moderate: Secondary | ICD-10-CM | POA: Diagnosis not present

## 2023-02-11 DIAGNOSIS — F411 Generalized anxiety disorder: Secondary | ICD-10-CM | POA: Diagnosis not present

## 2023-02-25 ENCOUNTER — Encounter: Payer: Self-pay | Admitting: Family Medicine

## 2023-02-25 DIAGNOSIS — F331 Major depressive disorder, recurrent, moderate: Secondary | ICD-10-CM | POA: Diagnosis not present

## 2023-02-25 DIAGNOSIS — F411 Generalized anxiety disorder: Secondary | ICD-10-CM | POA: Diagnosis not present

## 2023-02-25 DIAGNOSIS — M1610 Unilateral primary osteoarthritis, unspecified hip: Secondary | ICD-10-CM

## 2023-02-25 DIAGNOSIS — G8929 Other chronic pain: Secondary | ICD-10-CM

## 2023-02-25 NOTE — Telephone Encounter (Signed)
I placed an order for a fluoro-guided hip injection to be done at [facility], and it somehow ended up on our [facility] X-ray techs' worklist.   Please check this out, since it should be set up for outpatient fluoro-guided hip injection at [facility].   This has never happened before, and I do them routinely.   Thanks!

## 2023-02-26 NOTE — Telephone Encounter (Signed)
I am not sure why its showing [facility] on your end.   It has [facility] [place] listed as the location in Epic on the appt desk.   I will contact [facility] [place] to see what's going on with scheduling.

## 2023-02-27 ENCOUNTER — Ambulatory Visit: Payer: BC Managed Care – PPO | Admitting: Family Medicine

## 2023-02-28 ENCOUNTER — Other Ambulatory Visit: Payer: Self-pay | Admitting: Family Medicine

## 2023-02-28 NOTE — Telephone Encounter (Signed)
Last office visit 10/31/22 for Arm Pain and Anxiety.  Last refilled 10/08/22 for #180 with 1 refill.  Next Appt: 03/11/23 for Hip pain and Anxiety.

## 2023-03-06 ENCOUNTER — Ambulatory Visit
Admission: RE | Admit: 2023-03-06 | Discharge: 2023-03-06 | Disposition: A | Discharge status: Home / Self Care | Payer: BC Managed Care – PPO | Source: Ambulatory Visit | Attending: Family Medicine | Admitting: Family Medicine

## 2023-03-06 DIAGNOSIS — M1610 Unilateral primary osteoarthritis, unspecified hip: Secondary | ICD-10-CM

## 2023-03-06 DIAGNOSIS — G8929 Other chronic pain: Secondary | ICD-10-CM

## 2023-03-06 DIAGNOSIS — M1611 Unilateral primary osteoarthritis, right hip: Secondary | ICD-10-CM | POA: Diagnosis not present

## 2023-03-06 MED ORDER — METHYLPREDNISOLONE ACETATE 40 MG/ML INJ SUSP (RADIOLOG
80.0000 mg | Freq: Once | INTRAMUSCULAR | Status: AC
  Administered 2023-03-06: 80 mg via INTRA_ARTICULAR

## 2023-03-06 MED ORDER — IOPAMIDOL (ISOVUE-M 200) INJECTION 41%
1.0000 mL | Freq: Once | INTRAMUSCULAR | Status: AC
  Administered 2023-03-06: 1 mL via INTRA_ARTICULAR

## 2023-03-07 ENCOUNTER — Other Ambulatory Visit: Payer: BC Managed Care – PPO

## 2023-03-10 NOTE — Progress Notes (Unsigned)
    Priscilla Hasley T. Odilia Damico, MD, CAQ Sports Medicine [facility] [address] [place], [address]  Phone: [phone]  FAX: [phone]  Priscilla King - 47 y.o. female  MRN [id number]  Date of Birth: September 01, 1975  Date: 03/11/2023  PCP: Priscilla Beat, MD  Referral: Priscilla Beat, MD  No chief complaint on file.  Subjective:   Priscilla King is a 47 y.o. very pleasant female patient with There is no height or weight on file to calculate BMI. who presents with the following:  She is a very well-known patient, known for many years.  She presents today with follow-up on some ongoing chronic hip pain as well is anxiety.  She just had a fluoroscopy guided hip injection last week.    Review of Systems is noted in the HPI, as appropriate  Objective:   There were no vitals taken for this visit.  GEN: No acute distress; alert,appropriate. PULM: Breathing comfortably in no respiratory distress PSYCH: Normally interactive.   Laboratory and Imaging Data:  Assessment and Plan:   ***

## 2023-03-11 ENCOUNTER — Ambulatory Visit (INDEPENDENT_AMBULATORY_CARE_PROVIDER_SITE_OTHER): Payer: BC Managed Care – PPO | Admitting: Family Medicine

## 2023-03-11 ENCOUNTER — Encounter: Payer: Self-pay | Admitting: Family Medicine

## 2023-03-11 VITALS — BP 100/70 | HR 81 | Temp 98.9°F | Ht 68.0 in | Wt 220.1 lb

## 2023-03-11 DIAGNOSIS — F411 Generalized anxiety disorder: Secondary | ICD-10-CM | POA: Diagnosis not present

## 2023-03-11 DIAGNOSIS — G8929 Other chronic pain: Secondary | ICD-10-CM

## 2023-03-11 DIAGNOSIS — M1611 Unilateral primary osteoarthritis, right hip: Secondary | ICD-10-CM

## 2023-03-11 MED ORDER — BUSPIRONE HCL 7.5 MG PO TABS
7.5000 mg | ORAL_TABLET | Freq: Two times a day (BID) | ORAL | 3 refills | Status: DC
Start: 2023-03-11 — End: 2023-04-02

## 2023-03-11 NOTE — Patient Instructions (Signed)
Buspirone - let me know in about 3 weeks how you are doing.  Very easy to increase the dose

## 2023-03-14 ENCOUNTER — Encounter: Payer: Self-pay | Admitting: Obstetrics and Gynecology

## 2023-03-14 DIAGNOSIS — F411 Generalized anxiety disorder: Secondary | ICD-10-CM | POA: Diagnosis not present

## 2023-03-14 DIAGNOSIS — F331 Major depressive disorder, recurrent, moderate: Secondary | ICD-10-CM | POA: Diagnosis not present

## 2023-03-14 LAB — HM DIABETES EYE EXAM

## 2023-03-27 DIAGNOSIS — F411 Generalized anxiety disorder: Secondary | ICD-10-CM | POA: Diagnosis not present

## 2023-03-27 DIAGNOSIS — F331 Major depressive disorder, recurrent, moderate: Secondary | ICD-10-CM | POA: Diagnosis not present

## 2023-04-02 ENCOUNTER — Encounter: Payer: Self-pay | Admitting: Family Medicine

## 2023-04-02 ENCOUNTER — Telehealth: Payer: Self-pay | Admitting: Obstetrics and Gynecology

## 2023-04-02 ENCOUNTER — Other Ambulatory Visit: Payer: Self-pay | Admitting: Family Medicine

## 2023-04-02 NOTE — Telephone Encounter (Signed)
Patient was told that she is to have a follow up appointment with Dr. Valentino Saxon after ultrasound, not sure where and when to schedule her.  Please Advise.  CJ

## 2023-04-03 ENCOUNTER — Ambulatory Visit
Admission: RE | Admit: 2023-04-03 | Discharge: 2023-04-03 | Disposition: A | Discharge status: Home / Self Care | Payer: BC Managed Care – PPO | Source: Ambulatory Visit | Attending: Obstetrics and Gynecology | Admitting: Obstetrics and Gynecology

## 2023-04-03 DIAGNOSIS — D251 Intramural leiomyoma of uterus: Secondary | ICD-10-CM | POA: Insufficient documentation

## 2023-04-03 DIAGNOSIS — F331 Major depressive disorder, recurrent, moderate: Secondary | ICD-10-CM | POA: Diagnosis not present

## 2023-04-03 DIAGNOSIS — F411 Generalized anxiety disorder: Secondary | ICD-10-CM | POA: Diagnosis not present

## 2023-04-03 DIAGNOSIS — N83202 Unspecified ovarian cyst, left side: Secondary | ICD-10-CM | POA: Diagnosis not present

## 2023-04-03 DIAGNOSIS — D250 Submucous leiomyoma of uterus: Secondary | ICD-10-CM | POA: Diagnosis not present

## 2023-04-03 DIAGNOSIS — D259 Leiomyoma of uterus, unspecified: Secondary | ICD-10-CM | POA: Diagnosis not present

## 2023-04-03 MED ORDER — BUSPIRONE HCL 10 MG PO TABS: 10.0000 mg | ORAL_TABLET | Freq: Three times a day (TID) | ORAL | 2 refills | Status: DC

## 2023-04-03 NOTE — Telephone Encounter (Signed)
Patient

## 2023-04-03 NOTE — Telephone Encounter (Signed)
Dr. Valentino Saxon her Ultra Sound was today 04/03/2023.  Please advise

## 2023-04-05 NOTE — Telephone Encounter (Signed)
Good Morning , This task is complete.  She will be in on Apr 24, 2023 .

## 2023-04-10 DIAGNOSIS — F411 Generalized anxiety disorder: Secondary | ICD-10-CM | POA: Diagnosis not present

## 2023-04-10 DIAGNOSIS — F331 Major depressive disorder, recurrent, moderate: Secondary | ICD-10-CM | POA: Diagnosis not present

## 2023-04-15 ENCOUNTER — Ambulatory Visit
Admission: RE | Admit: 2023-04-15 | Discharge: 2023-04-15 | Disposition: A | Discharge status: Home / Self Care | Payer: BC Managed Care – PPO | Source: Ambulatory Visit | Attending: Obstetrics and Gynecology | Admitting: Obstetrics and Gynecology

## 2023-04-15 DIAGNOSIS — Z1231 Encounter for screening mammogram for malignant neoplasm of breast: Secondary | ICD-10-CM | POA: Diagnosis not present

## 2023-04-19 NOTE — Progress Notes (Unsigned)
Priscilla King T. Priscilla Macken, MD, CAQ Sports Medicine [facility] at [facility] [address] [place], [address]  Phone: [phone]  FAX: [phone]  Priscilla King - 47 y.o. female  MRN [id number]  Date of Birth: Jul 03, 1976  Date: 04/22/2023  PCP: Hannah Beat, MD  Referral: Hannah Beat, MD  No chief complaint on file.  Subjective:   Priscilla King is a 47 y.o. very pleasant female patient with There is no height or weight on file to calculate BMI. who presents with the following:  She is a very well-known patient, who I have known for many years.  She has been battling some ongoing anxiety.  Initially, she has been on Zoloft 100 mg, and the last time I saw her I did put her on buspirone 7.5 mg twice daily.  She did communicate with me via MyChart in the interval time period, and I did increase her buspirone to 10 mg p.o. 3 times daily.  Today she is here to follow-up on all of these things.    Review of Systems is noted in the HPI, as appropriate  Objective:   LMP 04/13/2023   GEN: No acute distress; alert,appropriate. PULM: Breathing comfortably in no respiratory distress PSYCH: Normally interactive.   Laboratory and Imaging Data:  Assessment and Plan:   ***

## 2023-04-22 ENCOUNTER — Ambulatory Visit (INDEPENDENT_AMBULATORY_CARE_PROVIDER_SITE_OTHER): Payer: BC Managed Care – PPO | Admitting: Family Medicine

## 2023-04-22 ENCOUNTER — Encounter: Payer: Self-pay | Admitting: Family Medicine

## 2023-04-22 VITALS — BP 120/74 | HR 90 | Temp 98.1°F | Ht 68.0 in | Wt 227.1 lb

## 2023-04-22 DIAGNOSIS — F331 Major depressive disorder, recurrent, moderate: Secondary | ICD-10-CM | POA: Diagnosis not present

## 2023-04-22 DIAGNOSIS — F411 Generalized anxiety disorder: Secondary | ICD-10-CM

## 2023-04-22 MED ORDER — OZEMPIC (2 MG/DOSE) 8 MG/3ML ~~LOC~~ SOPN: PEN_INJECTOR | SUBCUTANEOUS | 3 refills | Status: DC

## 2023-04-22 MED ORDER — AMITRIPTYLINE HCL 25 MG PO TABS: 25.0000 mg | ORAL_TABLET | Freq: Every day | ORAL | 3 refills | Status: DC

## 2023-04-24 ENCOUNTER — Encounter: Payer: Self-pay | Admitting: Obstetrics and Gynecology

## 2023-04-24 ENCOUNTER — Ambulatory Visit: Payer: BC Managed Care – PPO | Admitting: Obstetrics and Gynecology

## 2023-04-24 ENCOUNTER — Other Ambulatory Visit: Payer: Self-pay | Admitting: Family Medicine

## 2023-04-24 VITALS — BP 119/73 | HR 83 | Resp 16 | Ht 68.0 in | Wt 224.7 lb

## 2023-04-24 DIAGNOSIS — D259 Leiomyoma of uterus, unspecified: Secondary | ICD-10-CM | POA: Diagnosis not present

## 2023-04-24 DIAGNOSIS — F411 Generalized anxiety disorder: Secondary | ICD-10-CM | POA: Diagnosis not present

## 2023-04-24 DIAGNOSIS — F331 Major depressive disorder, recurrent, moderate: Secondary | ICD-10-CM

## 2023-04-24 DIAGNOSIS — E119 Type 2 diabetes mellitus without complications: Secondary | ICD-10-CM

## 2023-04-24 DIAGNOSIS — N920 Excessive and frequent menstruation with regular cycle: Secondary | ICD-10-CM | POA: Diagnosis not present

## 2023-04-24 DIAGNOSIS — Z01812 Encounter for preprocedural laboratory examination: Secondary | ICD-10-CM

## 2023-04-24 DIAGNOSIS — D250 Submucous leiomyoma of uterus: Secondary | ICD-10-CM

## 2023-04-24 DIAGNOSIS — Z9889 Other specified postprocedural states: Secondary | ICD-10-CM

## 2023-04-24 NOTE — Progress Notes (Signed)
GYNECOLOGY PROGRESS NOTE  Subjective:    Patient ID: Priscilla King, female    DOB: 10-02-1975, 47 y.o.   MRN: [id number]  HPI  Patient is a 47 y.o. [id number] female who presents for follow up after ultrasound. She had a repeat ultrasound for uterine fibroids on 04/03/2023 after undergoing endometrial ablation and hysteroscopic myomectomy in July 2024.  Patient notes that she is still experiencing the same level of bleeding as she was prior to surgery, however flow has become more unpredictable. Recent repeat ultrasound did show a decrease in her fibroid since her surgery.  Also noted a small 3 cm right ovarian cyst.   The following portions of the patient's history were reviewed and updated as appropriate: allergies, current medications, past family history, past medical history, past social history, past surgical history, and problem list.  Review of Systems Pertinent items are noted in HPI.   Objective:   Blood pressure 119/73, pulse 83, resp. rate 16, height 5\' 8"  (1.727 m), weight 224 lb 11.2 oz (101.9 kg), last menstrual period 04/13/2023. Body mass index is 34.17 kg/m. General appearance: alert, cooperative, and no distress Abdomen: soft, non-tender; bowel sounds normal; no masses,  no organomegaly Pelvic: deferred See H&P for remainder of exam.   Ultrasound Report:  CLINICAL DATA:  Follow-up fibroids. History of endometrial ablation 01/28/2023.   EXAM: TRANSABDOMINAL AND TRANSVAGINAL ULTRASOUND OF PELVIS   TECHNIQUE: Both transabdominal and transvaginal ultrasound examinations of the pelvis were performed. Transabdominal technique was performed for global imaging of the pelvis including uterus, ovaries, adnexal regions, and pelvic cul-de-sac. It was necessary to proceed with endovaginal exam following the transabdominal exam to visualize the uterus, endometrium and ovaries/adnexa to better advantage.   COMPARISON:  02/07/2023.  CT, 09/09/2020.   FINDINGS: Uterus    Measurements: 11.4 x 6.8 x 6.9 cm = volume: 280 ML. Discrete heterogeneous fibroid arises from the posterior upper uterine segment, displaced in the overlying endometrium and causing a posterior serosal bulge. This measures 4.7 x 4.6 x 5.2 cm. This measured 5.8 x 6.2 x 5.7 cm on the prior exam. No other uterine masses.   Endometrium   Thickness: 4 mm.  No focal abnormality visualized.   Right ovary   Measurements: 3.1 x 3.2 x 1.4 cm = volume: 7.34 mL. No ovary abnormality. No adnexal masses.   Left ovary   Measurements: 3.6 x 2.4 x 3.3 cm = volume: 14.8 mL. Three adjacent cysts versus a septated cyst, overall measuring 3 x 2.4 x 1.6 cm. No other ovary abnormality. No adnexal masses.   Other findings   No abnormal free fluid.  IMPRESSION: 1. Single uterine fibroid currently measures 5.2 cm in greatest dimension, previously 6.2 cm. This apparent difference may be due to differences in imaging/measurement technique only. 2. No other uterine abnormalities. 3. Left ovary cyst or cysts. Since this may reflect a single cyst with thicker septations, follow-up is recommended, with repeat ultrasound in 3 months.     Assessment:   1. Intramural and submucous leiomyoma of uterus   2. Menorrhagia with regular cycle   3. History of endometrial ablation   4. History of myomectomy   5. Encounter for pre-operative laboratory testing   6. Type 2 diabetes mellitus without complication, without long-term current use of insulin (HCC)      Plan:   - Discussed management options again for abnormal uterine bleeding including tranexamic acid (Lysteda), oral progesterone, Depo Provera, Levonogestrel IUD, or hysterectomy as definitive surgical management.  Discussed risks and benefits of each method.  Patient desires definitive management with hysterectomy at this time.  Would like to have surgery performed as soon as possible. Will schedule for 05/20/2023. See counseling below. Declined  anything for bleeding management at this time.  Bleeding precautions reviewed.   - Type II DM well controlled.   - Patient desires definitive management with hysterectomy.  I proposed doing a robotic-assisted total laparoscopic hysterectomy (TVH) and prophylactic bilateral salpingectomy.  No indication for oophorectomy.  Patient agrees with this proposed surgery.  The risks of surgery were discussed in detail with the patient including but not limited to: bleeding which may require transfusion or reoperation; infection which may require antibiotics; injury to bowel, bladder, ureters or other surrounding organs; need for additional procedures including laparotomy or subsequent procedures secondary to abnormal pathology; formation of adhesions; thromboembolic phenomenon; incisional problems and other postoperative/anesthesia complications.  Patient was also advised that she will  be discharged home after surgery unless complications arise; and expected recovery time after a hysterectomy is 6-8 weeks.  Patient was told that the likelihood that her condition and symptoms will be treated effectively with this surgical management was very high; the postoperative expectations were also discussed in detail. The patient also understands the alternative treatment options which were discussed in full. All questions were answered.  She was told that she will be contacted by our surgical scheduler regarding the time of her surgery; routine preoperative instructions will be given to her by the preoperative nursing team.  Routine postoperative instructions will be reviewed with the patient in detail after surgery.      Hildred Laser, MD [facility]

## 2023-04-24 NOTE — H&P (View-Only) (Signed)
GYNECOLOGY PREOPERATIVE HISTORY AND PHYSICAL   Subjective:  Priscilla King is a 47 y.o. [id number] here for surgical management of menorrhagia and fibroid uterus.  She has a history of menorrhagia for the past 1.5- 2 years with passage of large clots, cycles lasting 10-12 days, changing pads q 45 minutes during heaviest days. Has tried OCPs, IUD in the past for contraception. Recently underwent hysteroscopic myomectomy and endometrial ablation 2.5 months ago, with no change in her symptoms. Now desiring definitive management with hysterectomy. No significant preoperative concerns.  Proposed surgery: Robotic-assisted total laparoscopic hysterectomy with bilateral salpingectomy    Pertinent Gynecological History: Menses: see above HPI. Bleeding: dysfunctional uterine bleeding Contraception: none Last mammogram: normal Date: 04/15/2023 Last pap: normal Date: 12/11/2022.   Past Medical History:  Diagnosis Date   Anemia    Arthritis    Depression    Diabetes mellitus type 2, uncontrolled, without complications 06/2014   GERD (gastroesophageal reflux disease)    History of polycystic ovaries    Hyperlipidemia LDL goal <70 06/2014   Kienbock's avascular necrosis of lunate, adult    Menorrhagia with regular cycle    Morbid obesity with BMI of 40.0-44.9, adult (HCC) 08/14/2021    Past Surgical History:  Procedure Laterality Date   CESAREAN SECTION N/A 08/31/2012   Procedure: CESAREAN SECTION;  Surgeon: Catalina Antigua, MD;  Location: [facility];  Service: Obstetrics;  Laterality: N/A;   CESAREAN SECTION N/A 03/20/2017   Procedure: REPEAT CESAREAN SECTION;  Surgeon: Hildred Laser, MD;  Location: [facility];  Service: Obstetrics;  Laterality: N/A;  Female born @ 1500 Apgars: 8/9 Weight:10lbs 9ozs   COLONOSCOPY WITH PROPOFOL N/A 11/01/2021   Procedure: COLONOSCOPY WITH PROPOFOL;  Surgeon: Wyline Mood, MD;  Location: [facility] END[facility]COPY;  Service: Gastroenterology;  Laterality: N/A;   DILATATION &  CURETTAGE/HYSTEROSCOPY WITH MYOSURE N/A 01/28/2023   Procedure: DILATATION & CURETTAGE/HYSTEROSCOPY WITH MYOSURE;  Surgeon: Hildred Laser, MD;  Location: [facility] ORS;  Service: Gynecology;  Laterality: N/A;   DILITATION & CURRETTAGE/HYSTROSCOPY WITH NOVASURE ABLATION N/A 01/28/2023   Procedure: HYSTEROSCOPY D&C WITH ENDOMETRIAL ABLATION;  Surgeon: Hildred Laser, MD;  Location: [facility];  Service: Gynecology;  Laterality: N/A;    OB History  Gravida Para Term Preterm AB Living  3 2 2   1 2   SAB IAB Ectopic Multiple Live Births    1     2    # Outcome Date GA Lbr Len/2nd Weight Sex Type Anes PTL Lv  3 Term 03/20/17 [redacted]w[redacted]d  10 lb 9 oz (4.79 kg) F CS-Vac Spinal  LIV  2 Term 08/31/12 [redacted]w[redacted]d  8 lb 12.4 oz (3.98 kg) M CS-LVertical EPI  LIV  1 IAB 1998    U    DEC     Birth Comments: Elective AB    Family History  Problem Relation Age of Onset   Stroke Mother        CVA (TIA)   Diabetes Mother    Healthy Sister    Healthy Daughter    Diabetes Maternal Grandmother    Hypertension Maternal Grandmother    Diabetes Paternal Grandmother    Hypertension Paternal Grandmother    Healthy Brother    Healthy Brother    Healthy Son    Breast cancer Neg Hx     Social History   Socioeconomic History   Marital status: Married    Spouse name: Not on file   Number of children: 1   Years of education: Not on file  Highest education level: Doctorate  Occupational History   Occupation: Professor    Associate Professor: [employer]    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  (10/31/2022)   Overall Financial Resource Strain (CARDIA)    Difficulty of Paying Living Expenses: Not hard at all  Food Insecurity: No Food Insecurity (10/31/2022)   Hunger Vital Sign    Worried About Running Out of Food in the Last Year: Never true    Ran Out of Food in the Last Year: Never true  Transportation Needs: No Transportation Needs (10/31/2022)   PRAPARE - Administrator, Civil Service (Medical): No    Lack of Transportation (Non-Medical): No  Physical Activity: Insufficiently Active (10/31/2022)   Exercise Vital Sign    Days of Exercise per Week: 3 days    Minutes of Exercise per Session: 40 min  Stress: Stress Concern Present (10/31/2022)   [employer] of Occupational Health - Occupational Stress Questionnaire    Feeling of Stress : To some extent  Social Connections: Moderately Integrated (10/31/2022)   Social Connection and Isolation Panel [NHANES]    Frequency of Communication with Friends and Family: More than three times a week    Frequency of Social Gatherings with Friends and Family: Once a week    Attends Religious Services: Never    Database administrator or Organizations: Yes    Attends Engineer, structural: More than 4 times per year    Marital Status: Married  Catering manager Violence: Not on file    Current Outpatient Medications on File Prior to Visit  Medication Sig Dispense Refill   acetaminophen-codeine (TYLENOL #3) 300-30 MG tablet Take 1 tablet by mouth every 4 (four) hours as needed for moderate pain. 10 tablet 0   amitriptyline (ELAVIL) 25 MG tablet Take 1 tablet (25 mg total) by mouth at bedtime. 90 tablet 3   busPIRone (BUSPAR) 10 MG tablet Take 1 tablet (10 mg total) by mouth 3 (three) times daily. 90 tablet 2   celecoxib (CELEBREX) 200 MG capsule TAKE 1 CAPSULE BY MOUTH TWICE A DAY 180 capsule 1   Continuous Blood Gluc  Sensor (FREESTYLE LIBRE 2 SENSOR) MISC Place 1 application onto the skin every 14 (fourteen) days. 2 each 2   diclofenac Sodium (VOLTAREN) 1 % GEL Apply 2 g topically 4 (four) times daily as needed.     Ferrous Sulfate (IRON PO) Take 2 tablets by mouth daily at 6 (six) AM.     Multiple Vitamin (MULTIVITAMIN ADULT PO) Take 1 tablet by mouth daily at 6 (six) AM.     rosuvastatin (CRESTOR) 20 MG tablet Take 1 tablet (20 mg total) by mouth daily. 90 tablet 3   Semaglutide, 2 MG/DOSE, (OZEMPIC, 2 MG/DOSE,) 8 MG/3ML SOPN INJECT 2 MG AS DIRECTED ONCE A WEEK. 9  mL 3   No current facility-administered medications on file prior to visit.    Allergies  Allergen Reactions   Vicodin [Hydrocodone-Acetaminophen] Swelling    Tongue swelling and headache     Review of Systems Constitutional: No recent fever/chills/sweats Respiratory: No recent cough/bronchitis Cardiovascular: No chest pain Gastrointestinal: No recent nausea/vomiting/diarrhea Genitourinary: No UTI symptoms Hematologic/lymphatic:No history of coagulopathy or recent blood thinner use    Objective:   Blood pressure 119/73, pulse 83, resp. rate 16, height 5\' 8"  (1.727 m), weight 224 lb 11.2 oz (101.9 kg), last menstrual period 04/13/2023. CONSTITUTIONAL: Well-developed, well-nourished female in no acute distress.  HENT:  Normocephalic, atraumatic, External right and left ear normal. Oropharynx is clear and moist EYES: Conjunctivae and EOM are normal. Pupils are equal, round, and reactive to light. No scleral icterus.  NECK: Normal range of motion, supple, no masses SKIN: Skin is warm and dry. No rash noted. Not diaphoretic. No erythema. No pallor. NEUROLOGIC: Alert and oriented to person, place, and time. Normal reflexes, muscle tone coordination. No cranial nerve deficit noted. PSYCHIATRIC: Normal mood and affect. Normal behavior. Normal judgment and thought content. CARDIOVASCULAR: Normal heart rate noted, regular rhythm RESPIRATORY:  Effort and breath sounds normal, no problems with respiration noted ABDOMEN: Soft, nontender, nondistended. PELVIC: Deferred MUSCULOSKELETAL: Normal range of motion. No edema and no tenderness. 2+ distal pulses.    Labs: Lab Results  Component Value Date   WBC 4.8 01/23/2023   HGB 12.9 01/23/2023   HCT 40.0 01/23/2023   MCV 88.1 01/23/2023   PLT 249 01/23/2023    Lab Results  Component Value Date   HGBA1C 5.8 (A) 08/27/2022    Pathology:  A. ENDOMETRIUM, BIOPSY (in office, performed 01/08/2023):  -  Polypoid fragments of mixed phase endometrium (weakly proliferative  and focal secretory) with focal prominent vessels suggestive of polyp.  -  Focally there is evidence of stromal spindling and an  immunohistochemical stain for plasma cells is pending and will be  reported in an addendum.   Uterine fibroid(s) (performed during hysteroscopy, 01/28/2023) - FRAGMENTS OF BENIGN ENDOMETRIAL POLYP(S) - FRAGMENTS OF SMOOTH MUSCLE CONSISTENT WITH LEIOMYOMATA - NEGATIVE FOR HYPERPLASIA OR MALIGNANCY   Imaging Studies: MM 3D SCREENING MAMMOGRAM BILATERAL BREAST  Result Date: 04/16/2023 CLINICAL DATA:  Screening. EXAM: DIGITAL SCREENING BILATERAL MAMMOGRAM WITH TOMOSYNTHESIS AND CAD TECHNIQUE: Bilateral screening digital craniocaudal and mediolateral oblique mammograms were obtained. Bilateral screening digital breast tomosynthesis was performed. The images were evaluated with computer-aided detection. COMPARISON:  Previous exam(s). ACR Breast Density Category b: There are scattered areas of fibroglandular density. FINDINGS: There are no findings suspicious for malignancy. IMPRESSION: No mammographic evidence of malignancy. A result letter of this screening mammogram will be mailed directly to the patient. RECOMMENDATION: Screening mammogram in one year. (Code:SM-B-01Y) BI-RADS CATEGORY  1: Negative. Electronically Signed   By: Frederico Hamman M.D.   On: 04/16/2023 09:36   US PELVIC COMPLETE  WITH TRANSVAGINAL  Result Date: 04/15/2023 CLINICAL DATA:  Follow-up fibroids. History of endometrial ablation 01/28/2023. EXAM: TRANSABDOMINAL AND TRANSVAGINAL ULTRASOUND OF PELVIS TECHNIQUE: Both transabdominal and transvaginal ultrasound examinations of the pelvis were performed. Transabdominal technique was performed for global imaging of the pelvis including uterus, ovaries, adnexal regions, and pelvic cul-de-sac. It was necessary to proceed with endovaginal exam following the transabdominal exam to visualize the uterus, endometrium and ovaries/adnexa to better advantage. COMPARISON:  02/07/2023.  CT, 09/09/2020. FINDINGS: Uterus Measurements: 11.4 x 6.8 x 6.9 cm = volume: 280 ML. Discrete heterogeneous fibroid arises from the  posterior upper uterine segment, displaced in the overlying endometrium and causing a posterior serosal bulge. This measures 4.7 x 4.6 x 5.2 cm. This measured 5.8 x 6.2 x 5.7 cm on the prior exam. No other uterine masses. Endometrium Thickness: 4 mm.  No focal abnormality visualized. Right ovary Measurements: 3.1 x 3.2 x 1.4 cm = volume: 7.34 mL. No ovary abnormality. No adnexal masses. Left ovary Measurements: 3.6 x 2.4 x 3.3 cm = volume: 14.8 mL. Three adjacent cysts versus a septated cyst, overall measuring 3 x 2.4 x 1.6 cm. No other ovary abnormality. No adnexal masses. Other findings No abnormal free fluid. IMPRESSION: 1. Single uterine fibroid currently measures 5.2 cm in greatest dimension, previously 6.2 cm. This apparent difference may be due to differences in imaging/measurement technique only. 2. No other uterine abnormalities. 3. Left ovary cyst or cysts. Since this may reflect a single cyst with thicker septations, follow-up is recommended, with repeat ultrasound in 3 months. Electronically Signed   By: Amie Portland M.D.   On: 04/15/2023 13:59     Assessment:    Menorrhagia  Fibroid uterus DM Type II Endometrial polyp History of myomectomy and endometrial  ablation   Plan:   - Counseling: Procedure, risks, reasons, benefits and complications (including injury to bowel, bladder, major blood vessel, ureter, bleeding, possibility of transfusion, infection, or fistula formation) reviewed in detail. Likelihood of success in alleviating the patient's condition was discussed. Routine postoperative instructions will be reviewed with the patient and her family in detail after surgery.  The patient concurred with the proposed plan, giving informed written consent for the surgery.   - Preop testing ordered. - Advised to be NPO after midnight on day prior to procedure.    Hildred Laser, MD [facility]

## 2023-04-24 NOTE — Telephone Encounter (Signed)
Sertraline Last filled:  01/28/23, #90 Last OV:  04/22/23, GAD f/U Next OV:  10/21/23, CPE

## 2023-04-24 NOTE — H&P (Signed)
GYNECOLOGY PREOPERATIVE HISTORY AND PHYSICAL   Subjective:  Priscilla King is a 47 y.o. [id number] here for surgical management of menorrhagia and fibroid uterus.  She has a history of menorrhagia for the past 1.5- 2 years with passage of large clots, cycles lasting 10-12 days, changing pads q 45 minutes during heaviest days. Has tried OCPs, IUD in the past for contraception. Recently underwent hysteroscopic myomectomy and endometrial ablation 2.5 months ago, with no change in her symptoms. Now desiring definitive management with hysterectomy. No significant preoperative concerns.  Proposed surgery: Robotic-assisted total laparoscopic hysterectomy with bilateral salpingectomy    Pertinent Gynecological History: Menses: see above HPI. Bleeding: dysfunctional uterine bleeding Contraception: none Last mammogram: normal Date: 04/15/2023 Last pap: normal Date: 12/11/2022.   Past Medical History:  Diagnosis Date   Anemia    Arthritis    Depression    Diabetes mellitus type 2, uncontrolled, without complications 06/2014   GERD (gastroesophageal reflux disease)    History of polycystic ovaries    Hyperlipidemia LDL goal <70 06/2014   Kienbock's avascular necrosis of lunate, adult    Menorrhagia with regular cycle    Morbid obesity with BMI of 40.0-44.9, adult (HCC) 08/14/2021    Past Surgical History:  Procedure Laterality Date   CESAREAN SECTION N/A 08/31/2012   Procedure: CESAREAN SECTION;  Surgeon: Catalina Antigua, MD;  Location: [facility];  Service: Obstetrics;  Laterality: N/A;   CESAREAN SECTION N/A 03/20/2017   Procedure: REPEAT CESAREAN SECTION;  Surgeon: Hildred Laser, MD;  Location: [facility];  Service: Obstetrics;  Laterality: N/A;  Female born @ 1500 Apgars: 8/9 Weight:10lbs 9ozs   COLONOSCOPY WITH PROPOFOL N/A 11/01/2021   Procedure: COLONOSCOPY WITH PROPOFOL;  Surgeon: Wyline Mood, MD;  Location: [facility] ENDOSCOPY;  Service: Gastroenterology;  Laterality: N/A;   DILATATION &  CURETTAGE/HYSTEROSCOPY WITH MYOSURE N/A 01/28/2023   Procedure: DILATATION & CURETTAGE/HYSTEROSCOPY WITH MYOSURE;  Surgeon: Hildred Laser, MD;  Location: [facility] OR[facility];  Service: Gynecology;  Laterality: N/A;   DILITATION & CURRETTAGE/HYSTROSCOPY WITH NOVASURE ABLATION N/A 01/28/2023   Procedure: HYSTEROSCOPY D&C WITH ENDOMETRIAL ABLATION;  Surgeon: Hildred Laser, MD;  Location: [facility];  Service: Gynecology;  Laterality: N/A;    OB History  Gravida Para Term Preterm AB Living  3 2 2   1 2   SAB IAB Ectopic Multiple Live Births    1     2    # Outcome Date GA Lbr Len/2nd Weight Sex Type Anes PTL Lv  3 Term 03/20/17 [redacted]w[redacted]d  10 lb 9 oz (4.79 kg) F CS-Vac Spinal  LIV  2 Term 08/31/12 [redacted]w[redacted]d  8 lb 12.4 oz (3.98 kg) M CS-LVertical EPI  LIV  1 IAB 1998    U    DEC     Birth Comments: Elective AB    Family History  Problem Relation Age of Onset   Stroke Mother        CVA (TIA)   Diabetes Mother    Healthy Sister    Healthy Daughter    Diabetes Maternal Grandmother    Hypertension Maternal Grandmother    Diabetes Paternal Grandmother    Hypertension Paternal Grandmother    Healthy Brother    Healthy Brother    Healthy Son    Breast cancer Neg Hx     Social History   Socioeconomic History   Marital status: Married    Spouse name: Not on file   Number of children: 1   Years of education: Not on file  Highest education level: Doctorate  Occupational History   Occupation: Professor    Associate Professor: [employer]    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  (10/31/2022)   Overall Financial Resource Strain (CARDIA)    Difficulty of Paying Living Expenses: Not hard at all  Food Insecurity: No Food Insecurity (10/31/2022)   Hunger Vital Sign    Worried About Running Out of Food in the Last Year: Never true    Ran Out of Food in the Last Year: Never true  Transportation Needs: No Transportation Needs (10/31/2022)   PRAPARE - Administrator, Civil Service (Medical): No    Lack of Transportation (Non-Medical): No  Physical Activity: Insufficiently Active (10/31/2022)   Exercise Vital Sign    Days of Exercise per Week: 3 days    Minutes of Exercise per Session: 40 min  Stress: Stress Concern Present (10/31/2022)   [employer] of Occupational Health - Occupational Stress Questionnaire    Feeling of Stress : To some extent  Social Connections: Moderately Integrated (10/31/2022)   Social Connection and Isolation Panel [NHANES]    Frequency of Communication with Friends and Family: More than three times a week    Frequency of Social Gatherings with Friends and Family: Once a week    Attends Religious Services: Never    Database administrator or Organizations: Yes    Attends Engineer, structural: More than 4 times per year    Marital Status: Married  Catering manager Violence: Not on file    Current Outpatient Medications on File Prior to Visit  Medication Sig Dispense Refill   acetaminophen-codeine (TYLENOL #3) 300-30 MG tablet Take 1 tablet by mouth every 4 (four) hours as needed for moderate pain. 10 tablet 0   amitriptyline (ELAVIL) 25 MG tablet Take 1 tablet (25 mg total) by mouth at bedtime. 90 tablet 3   busPIRone (BUSPAR) 10 MG tablet Take 1 tablet (10 mg total) by mouth 3 (three) times daily. 90 tablet 2   celecoxib (CELEBREX) 200 MG capsule TAKE 1 CAPSULE BY MOUTH TWICE A DAY 180 capsule 1   Continuous Blood Gluc  Sensor (FREESTYLE LIBRE 2 SENSOR) MISC Place 1 application onto the skin every 14 (fourteen) days. 2 each 2   diclofenac Sodium (VOLTAREN) 1 % GEL Apply 2 g topically 4 (four) times daily as needed.     Ferrous Sulfate (IRON PO) Take 2 tablets by mouth daily at 6 (six) AM.     Multiple Vitamin (MULTIVITAMIN ADULT PO) Take 1 tablet by mouth daily at 6 (six) AM.     rosuvastatin (CRESTOR) 20 MG tablet Take 1 tablet (20 mg total) by mouth daily. 90 tablet 3   Semaglutide, 2 MG/DOSE, (OZEMPIC, 2 MG/DOSE,) 8 MG/3ML SOPN INJECT 2 MG AS DIRECTED ONCE A WEEK. 9  mL 3   No current facility-administered medications on file prior to visit.    Allergies  Allergen Reactions   Vicodin [Hydrocodone-Acetaminophen] Swelling    Tongue swelling and headache     Review of Systems Constitutional: No recent fever/chills/sweats Respiratory: No recent cough/bronchitis Cardiovascular: No chest pain Gastrointestinal: No recent nausea/vomiting/diarrhea Genitourinary: No UTI symptoms Hematologic/lymphatic:No history of coagulopathy or recent blood thinner use    Objective:   Blood pressure 119/73, pulse 83, resp. rate 16, height 5\' 8"  (1.727 m), weight 224 lb 11.2 oz (101.9 kg), last menstrual period 04/13/2023. CONSTITUTIONAL: Well-developed, well-nourished female in no acute distress.  HENT:  Normocephalic, atraumatic, External right and left ear normal. Oropharynx is clear and moist EYES: Conjunctivae and EOM are normal. Pupils are equal, round, and reactive to light. No scleral icterus.  NECK: Normal range of motion, supple, no masses SKIN: Skin is warm and dry. No rash noted. Not diaphoretic. No erythema. No pallor. NEUROLOGIC: Alert and oriented to person, place, and time. Normal reflexes, muscle tone coordination. No cranial nerve deficit noted. PSYCHIATRIC: Normal mood and affect. Normal behavior. Normal judgment and thought content. CARDIOVASCULAR: Normal heart rate noted, regular rhythm RESPIRATORY:  Effort and breath sounds normal, no problems with respiration noted ABDOMEN: Soft, nontender, nondistended. PELVIC: Deferred MUSCULOSKELETAL: Normal range of motion. No edema and no tenderness. 2+ distal pulses.    Labs: Lab Results  Component Value Date   WBC 4.8 01/23/2023   HGB 12.9 01/23/2023   HCT 40.0 01/23/2023   MCV 88.1 01/23/2023   PLT 249 01/23/2023    Lab Results  Component Value Date   HGBA1C 5.8 (A) 08/27/2022    Pathology:  A. ENDOMETRIUM, BIOPSY (in office, performed 01/08/2023):  -  Polypoid fragments of mixed phase endometrium (weakly proliferative  and focal secretory) with focal prominent vessels suggestive of polyp.  -  Focally there is evidence of stromal spindling and an  immunohistochemical stain for plasma cells is pending and will be  reported in an addendum.   Uterine fibroid(s) (performed during hysteroscopy, 01/28/2023) - FRAGMENTS OF BENIGN ENDOMETRIAL POLYP(S) - FRAGMENTS OF SMOOTH MUSCLE CONSISTENT WITH LEIOMYOMATA - NEGATIVE FOR HYPERPLASIA OR MALIGNANCY   Imaging Studies: MM 3D SCREENING MAMMOGRAM BILATERAL BREAST  Result Date: 04/16/2023 CLINICAL DATA:  Screening. EXAM: DIGITAL SCREENING BILATERAL MAMMOGRAM WITH TOMOSYNTHESIS AND CAD TECHNIQUE: Bilateral screening digital craniocaudal and mediolateral oblique mammograms were obtained. Bilateral screening digital breast tomosynthesis was performed. The images were evaluated with computer-aided detection. COMPARISON:  Previous exam(s). ACR Breast Density Category b: There are scattered areas of fibroglandular density. FINDINGS: There are no findings suspicious for malignancy. IMPRESSION: No mammographic evidence of malignancy. A result letter of this screening mammogram will be mailed directly to the patient. RECOMMENDATION: Screening mammogram in one year. (Code:SM-B-01Y) BI-RADS CATEGORY  1: Negative. Electronically Signed   By: Frederico Hamman M.D.   On: 04/16/2023 09:36   US PELVIC COMPLETE  WITH TRANSVAGINAL  Result Date: 04/15/2023 CLINICAL DATA:  Follow-up fibroids. History of endometrial ablation 01/28/2023. EXAM: TRANSABDOMINAL AND TRANSVAGINAL ULTRASOUND OF PELVIS TECHNIQUE: Both transabdominal and transvaginal ultrasound examinations of the pelvis were performed. Transabdominal technique was performed for global imaging of the pelvis including uterus, ovaries, adnexal regions, and pelvic cul-de-sac. It was necessary to proceed with endovaginal exam following the transabdominal exam to visualize the uterus, endometrium and ovaries/adnexa to better advantage. COMPARISON:  02/07/2023.  CT, 09/09/2020. FINDINGS: Uterus Measurements: 11.4 x 6.8 x 6.9 cm = volume: 280 ML. Discrete heterogeneous fibroid arises from the  posterior upper uterine segment, displaced in the overlying endometrium and causing a posterior serosal bulge. This measures 4.7 x 4.6 x 5.2 cm. This measured 5.8 x 6.2 x 5.7 cm on the prior exam. No other uterine masses. Endometrium Thickness: 4 mm.  No focal abnormality visualized. Right ovary Measurements: 3.1 x 3.2 x 1.4 cm = volume: 7.34 mL. No ovary abnormality. No adnexal masses. Left ovary Measurements: 3.6 x 2.4 x 3.3 cm = volume: 14.8 mL. Three adjacent cysts versus a septated cyst, overall measuring 3 x 2.4 x 1.6 cm. No other ovary abnormality. No adnexal masses. Other findings No abnormal free fluid. IMPRESSION: 1. Single uterine fibroid currently measures 5.2 cm in greatest dimension, previously 6.2 cm. This apparent difference may be due to differences in imaging/measurement technique only. 2. No other uterine abnormalities. 3. Left ovary cyst or cysts. Since this may reflect a single cyst with thicker septations, follow-up is recommended, with repeat ultrasound in 3 months. Electronically Signed   By: Amie Portland M.D.   On: 04/15/2023 13:59     Assessment:    Menorrhagia  Fibroid uterus DM Type II Endometrial polyp History of myomectomy and endometrial  ablation   Plan:   - Counseling: Procedure, risks, reasons, benefits and complications (including injury to bowel, bladder, major blood vessel, ureter, bleeding, possibility of transfusion, infection, or fistula formation) reviewed in detail. Likelihood of success in alleviating the patient's condition was discussed. Routine postoperative instructions will be reviewed with the patient and her family in detail after surgery.  The patient concurred with the proposed plan, giving informed written consent for the surgery.   - Preop testing ordered. - Advised to be NPO after midnight on day prior to procedure.    Hildred Laser, MD [facility]

## 2023-05-01 DIAGNOSIS — F411 Generalized anxiety disorder: Secondary | ICD-10-CM | POA: Diagnosis not present

## 2023-05-01 DIAGNOSIS — F331 Major depressive disorder, recurrent, moderate: Secondary | ICD-10-CM | POA: Diagnosis not present

## 2023-05-08 DIAGNOSIS — F411 Generalized anxiety disorder: Secondary | ICD-10-CM | POA: Diagnosis not present

## 2023-05-08 DIAGNOSIS — F331 Major depressive disorder, recurrent, moderate: Secondary | ICD-10-CM | POA: Diagnosis not present

## 2023-05-13 ENCOUNTER — Encounter
Admission: RE | Admit: 2023-05-13 | Discharge: 2023-05-13 | Disposition: A | Discharge status: Home / Self Care | Payer: BC Managed Care – PPO | Source: Ambulatory Visit | Attending: Obstetrics and Gynecology | Admitting: Obstetrics and Gynecology

## 2023-05-13 ENCOUNTER — Other Ambulatory Visit: Payer: Self-pay

## 2023-05-13 VITALS — Ht 68.0 in | Wt 220.0 lb

## 2023-05-13 DIAGNOSIS — E1165 Type 2 diabetes mellitus with hyperglycemia: Secondary | ICD-10-CM

## 2023-05-13 DIAGNOSIS — Z01812 Encounter for preprocedural laboratory examination: Secondary | ICD-10-CM

## 2023-05-13 HISTORY — DX: Other specified joint disorders, right hip: M25.851

## 2023-05-13 HISTORY — DX: Polyosteoarthritis, unspecified: M15.9

## 2023-05-13 HISTORY — DX: Polycystic ovarian syndrome: E28.2

## 2023-05-13 NOTE — Patient Instructions (Addendum)
Your procedure is scheduled on:  Monday   October 28   Report to the Registration Desk on the 1st floor of the [facility] To find out your arrival time, please call [phone] between 1PM - 3PM on: Friday October 25  If your arrival time is 6:00 am, do not arrive before that time as the [facility] entrance doors do not open until 6:00 am.  REMEMBER: Instructions that are not followed completely may result in serious medical risk, up to and including death; or upon the discretion of your surgeon and anesthesiologist your surgery may need to be rescheduled.  Do not eat food after midnight the night before surgery.  No gum chewing or hard candies.  You may however, drink WATER up to 2 hours before you are scheduled to arrive for your surgery. Do not drink anything within 2 hours of your scheduled arrival time. In addition, your doctor has ordered for you to drink the provided:  Gatorade G2 Drinking this carbohydrate drink up to two hours before surgery helps to reduce insulin resistance and improve patient outcomes. Please complete drinking 2 hours before scheduled arrival time.  One week prior to surgery: Monday October 21 Stop Anti-inflammatories (NSAIDS) such as Advil, Aleve, Ibuprofen, Motrin, Naproxen, Naprosyn and Aspirin based products such as Excedrin, Goody's Powder, BC Powder.  Stop ANY OVER THE COUNTER supplements until after surgery. Multiple Vitamin (MULTIVITAMIN ADULT PO)  Ferrous Sulfate (IRON PO)  You may however, continue to take Tylenol if needed for pain up until the day of surgery.  **Follow guidelines for insulin and diabetes medications.** Semaglutide, 2 MG/DOSE, (OZEMPIC, 2 MG/DOSE) , hold 7 days prior to surgery, last dose Sunday October 20  **Follow recommendations regarding stopping blood thinners.**  Continue taking all of your other prescription medications up until the day of surgery.  ON THE DAY OF SURGERY ONLY TAKE THESE MEDICATIONS WITH SIPS OF  WATER:  busPIRone (BUSPAR)  rosuvastatin (CRESTOR)   No Alcohol for 24 hours before or after surgery.  No Smoking including e-cigarettes for 24 hours before surgery.  No chewable tobacco products for at least 6 hours before surgery.  No nicotine patches on the day of surgery.  Do not use any "recreational" drugs for at least a week (preferably 2 weeks) before your surgery.  Please be advised that the combination of cocaine and anesthesia may have negative outcomes, up to and including death. If you test positive for cocaine, your surgery will be cancelled.  On the morning of surgery brush your teeth with toothpaste and water, you may rinse your mouth with mouthwash if you wish. Do not swallow any toothpaste or mouthwash.  Use CHG Soap as directed on instruction sheet.  Do not wear jewelry, make-up, hairpins, clips or nail polish.  For welded (permanent) jewelry: bracelets, anklets, waist bands, etc.  Please have this removed prior to surgery.  If it is not removed, there is a chance that hospital personnel will need to cut it off on the day of surgery.  Do not wear lotions, powders, or perfumes.   Do not shave body hair from the neck down 48 hours before surgery.  Contact lenses, hearing aids and dentures may not be worn into surgery.  Do not bring valuables to the hospital. [facility] is not responsible for any missing/lost belongings or valuables.   Notify your doctor if there is any change in your medical condition (cold, fever, infection).  Wear comfortable clothing (specific to your surgery type) to  the hospital.  After surgery, you can help prevent lung complications by doing breathing exercises.  Take deep breaths and cough every 1-2 hours.   Your doctor may order a device called an Incentive Spirometer to help you take deep breaths.  When coughing or sneezing, hold a pillow firmly against your incision with both hands. This is called "splinting." Doing this helps  protect your incision. It also decreases belly discomfort.  If you are being discharged the day of surgery, you will not be allowed to drive home. You will need a responsible individual to drive you home and stay with you for 24 hours after surgery.   If you are taking public transportation, you will need to have a responsible individual with you.  Please call the Pre-admissions Testing Dept. at [phone] if you have any questions about these instructions.  Surgery Visitation Policy:  Patients having surgery or a procedure may have two visitors.  Children under the age of 25 must have an adult with them who is not the patient.          Preparing for Surgery with CHLORHEXIDINE GLUCONATE (CHG) Soap  Chlorhexidine Gluconate (CHG) Soap  o An antiseptic cleaner that kills germs and bonds with the skin to continue killing germs even after washing  o Used for showering the night before surgery and morning of surgery  Before surgery, you can play an important role by reducing the number of germs on your skin.  CHG (Chlorhexidine gluconate) soap is an antiseptic cleanser which kills germs and bonds with the skin to continue killing germs even after washing.  Please do not use if you have an allergy to CHG or antibacterial soaps. If your skin becomes reddened/irritated stop using the CHG.  1. Shower the NIGHT BEFORE SURGERY and the MORNING OF SURGERY with CHG soap.  2. If you choose to wash your hair, wash your hair first as usual with your normal shampoo.  3. After shampooing, rinse your hair and body thoroughly to remove the shampoo.  4. Use CHG as you would any other liquid soap. You can apply CHG directly to the skin and wash gently with a scrungie or a clean washcloth.  5. Apply the CHG soap to your body only from the neck down. Do not use on open wounds or open sores. Avoid contact with your eyes, ears, mouth, and genitals (private parts). Wash face and genitals (private  parts) with your normal soap.  6. Wash thoroughly, paying special attention to the area where your surgery will be performed.  7. Thoroughly rinse your body with warm water.  8. Do not shower/wash with your normal soap after using and rinsing off the CHG soap.  9. Pat yourself dry with a clean towel.  10. Wear clean pajamas to bed the night before surgery.  12. Place clean sheets on your bed the night of your first shower and do not sleep with pets.  13. Shower again with the CHG soap on the day of surgery prior to arriving at the hospital.  14. Do not apply any deodorants/lotions/powders.  15. Please wear clean clothes to the hospital.            How to Use an Incentive Spirometer  An incentive spirometer is a tool that measures how well you are filling your lungs with each breath. Learning to take long, deep breaths using this tool can help you keep your lungs clear and active. This may help to reverse or lessen  your chance of developing breathing (pulmonary) problems, especially infection. You may be asked to use a spirometer: After a surgery. If you have a lung problem or a history of smoking. After a long period of time when you have been unable to move or be active. If the spirometer includes an indicator to show the highest number that you have reached, your health care provider or respiratory therapist will help you set a goal. Keep a log of your progress as told by your health care provider. What are the risks? Breathing too quickly may cause dizziness or cause you to pass out. Take your time so you do not get dizzy or light-headed. If you are in pain, you may need to take pain medicine before doing incentive spirometry. It is harder to take a deep breath if you are having pain. How to use your incentive spirometer  Sit up on the edge of your bed or on a chair. Hold the incentive spirometer so that it is in an upright position. Before you use the spirometer,  breathe out normally. Place the mouthpiece in your mouth. Make sure your lips are closed tightly around it. Breathe in slowly and as deeply as you can through your mouth, causing the piston or the ball to rise toward the top of the chamber. Hold your breath for 3-5 seconds, or for as long as possible. If the spirometer includes a coach indicator, use this to guide you in breathing. Slow down your breathing if the indicator goes above the marked areas. Remove the mouthpiece from your mouth and breathe out normally. The piston or ball will return to the bottom of the chamber. Rest for a few seconds, then repeat the steps 10 or more times. Take your time and take a few normal breaths between deep breaths so that you do not get dizzy or light-headed. Do this every 1-2 hours when you are awake. If the spirometer includes a goal marker to show the highest number you have reached (best effort), use this as a goal to work toward during each repetition. After each set of 10 deep breaths, cough a few times. This will help to make sure that your lungs are clear. If you have an incision on your chest or abdomen from surgery, place a pillow or a rolled-up towel firmly against the incision when you cough. This can help to reduce pain while taking deep breaths and coughing. General tips When you are able to get out of bed: Walk around often. Continue to take deep breaths and cough in order to clear your lungs. Keep using the incentive spirometer until your health care provider says it is okay to stop using it. If you have been in the hospital, you may be told to keep using the spirometer at home. Contact a health care provider if: You are having difficulty using the spirometer. You have trouble using the spirometer as often as instructed. Your pain medicine is not giving enough relief for you to use the spirometer as told. You have a fever. Get help right away if: You develop shortness of breath. You  develop a cough with bloody mucus from the lungs. You have fluid or blood coming from an incision site after you cough. Summary An incentive spirometer is a tool that can help you learn to take long, deep breaths to keep your lungs clear and active. You may be asked to use a spirometer after a surgery, if you have a lung problem or a history  of smoking, or if you have been inactive for a long period of time. Use your incentive spirometer as instructed every 1-2 hours while you are awake. If you have an incision on your chest or abdomen, place a pillow or a rolled-up towel firmly against your incision when you cough. This will help to reduce pain. Get help right away if you have shortness of breath, you cough up bloody mucus, or blood comes from your incision when you cough. This information is not intended to replace advice given to you by your health care provider. Make sure you discuss any questions you have with your health care provider. Document Revised: 09/28/2019 Document Reviewed: 09/28/2019 Elsevier Patient Education  2023 [employer].

## 2023-05-15 ENCOUNTER — Encounter
Admission: RE | Admit: 2023-05-15 | Discharge: 2023-05-15 | Disposition: A | Discharge status: Home / Self Care | Payer: BC Managed Care – PPO | Source: Ambulatory Visit | Attending: Obstetrics and Gynecology | Admitting: Obstetrics and Gynecology

## 2023-05-15 DIAGNOSIS — F411 Generalized anxiety disorder: Secondary | ICD-10-CM | POA: Diagnosis not present

## 2023-05-15 DIAGNOSIS — Z01812 Encounter for preprocedural laboratory examination: Secondary | ICD-10-CM | POA: Diagnosis not present

## 2023-05-15 DIAGNOSIS — Z01818 Encounter for other preprocedural examination: Secondary | ICD-10-CM | POA: Diagnosis not present

## 2023-05-15 DIAGNOSIS — F331 Major depressive disorder, recurrent, moderate: Secondary | ICD-10-CM | POA: Diagnosis not present

## 2023-05-15 DIAGNOSIS — E119 Type 2 diabetes mellitus without complications: Secondary | ICD-10-CM | POA: Diagnosis not present

## 2023-05-15 LAB — CBC
HCT: 41 % (ref 36.0–46.0)
Hemoglobin: 13.4 g/dL (ref 12.0–15.0)
MCH: 28.8 pg (ref 26.0–34.0)
MCHC: 32.7 g/dL (ref 30.0–36.0)
MCV: 88.2 fL (ref 80.0–100.0)
Platelets: 235 10*3/uL (ref 150–400)
RBC: 4.65 MIL/uL (ref 3.87–5.11)
RDW: 13.2 % (ref 11.5–15.5)
WBC: 5.4 10*3/uL (ref 4.0–10.5)
nRBC: 0 % (ref 0.0–0.2)

## 2023-05-15 LAB — COMPREHENSIVE METABOLIC PANEL
ALT: 30 U/L (ref 0–44)
AST: 27 U/L (ref 15–41)
Albumin: 4.2 g/dL (ref 3.5–5.0)
Alkaline Phosphatase: 63 U/L (ref 38–126)
Anion gap: 7 (ref 5–15)
BUN: 11 mg/dL (ref 6–20)
CO2: 23 mmol/L (ref 22–32)
Calcium: 8.8 mg/dL — ABNORMAL LOW (ref 8.9–10.3)
Chloride: 104 mmol/L (ref 98–111)
Creatinine, Ser: 0.64 mg/dL (ref 0.44–1.00)
GFR, Estimated: >60 mL/min (ref >60)
Glucose, Bld: 121 mg/dL — ABNORMAL HIGH (ref 70–99)
Potassium: 3.8 mmol/L (ref 3.5–5.1)
Sodium: 134 mmol/L — ABNORMAL LOW (ref 135–145)
Total Bilirubin: 0.6 mg/dL (ref 0.3–1.2)
Total Protein: 8.1 g/dL (ref 6.5–8.1)

## 2023-05-15 LAB — HEMOGLOBIN A1C
Hgb A1c MFr Bld: 5.9 % — ABNORMAL HIGH (ref 4.8–5.6)
Mean Plasma Glucose: 122.63 mg/dL

## 2023-05-15 LAB — TYPE AND SCREEN
ABO/RH(D): O POS
Antibody Screen: NEGATIVE

## 2023-05-15 LAB — HEPATITIS C ANTIBODY: HCV Ab: NONREACTIVE

## 2023-05-20 ENCOUNTER — Ambulatory Visit: Payer: BC Managed Care – PPO

## 2023-05-20 ENCOUNTER — Other Ambulatory Visit: Payer: Self-pay

## 2023-05-20 ENCOUNTER — Ambulatory Visit
Admission: RE | Admit: 2023-05-20 | Discharge: 2023-05-20 | Disposition: A | Discharge status: Home / Self Care | Payer: BC Managed Care – PPO | Source: Ambulatory Visit | Attending: Obstetrics and Gynecology | Admitting: Obstetrics and Gynecology

## 2023-05-20 ENCOUNTER — Encounter: Payer: Self-pay | Admitting: Obstetrics and Gynecology

## 2023-05-20 ENCOUNTER — Encounter
Admission: RE | Disposition: A | Discharge status: Home / Self Care | Payer: Self-pay | Source: Ambulatory Visit | Attending: Obstetrics and Gynecology

## 2023-05-20 ENCOUNTER — Ambulatory Visit: Payer: BC Managed Care – PPO | Admitting: Urgent Care

## 2023-05-20 DIAGNOSIS — K219 Gastro-esophageal reflux disease without esophagitis: Secondary | ICD-10-CM | POA: Insufficient documentation

## 2023-05-20 DIAGNOSIS — N920 Excessive and frequent menstruation with regular cycle: Secondary | ICD-10-CM

## 2023-05-20 DIAGNOSIS — D259 Leiomyoma of uterus, unspecified: Secondary | ICD-10-CM

## 2023-05-20 DIAGNOSIS — Z6833 Body mass index (BMI) 33.0-33.9, adult: Secondary | ICD-10-CM | POA: Insufficient documentation

## 2023-05-20 DIAGNOSIS — D251 Intramural leiomyoma of uterus: Secondary | ICD-10-CM | POA: Diagnosis not present

## 2023-05-20 DIAGNOSIS — Z7985 Long-term (current) use of injectable non-insulin antidiabetic drugs: Secondary | ICD-10-CM | POA: Insufficient documentation

## 2023-05-20 DIAGNOSIS — N938 Other specified abnormal uterine and vaginal bleeding: Secondary | ICD-10-CM

## 2023-05-20 DIAGNOSIS — N879 Dysplasia of cervix uteri, unspecified: Secondary | ICD-10-CM | POA: Diagnosis not present

## 2023-05-20 DIAGNOSIS — E119 Type 2 diabetes mellitus without complications: Secondary | ICD-10-CM | POA: Diagnosis not present

## 2023-05-20 DIAGNOSIS — N858 Other specified noninflammatory disorders of uterus: Secondary | ICD-10-CM | POA: Diagnosis not present

## 2023-05-20 DIAGNOSIS — N838 Other noninflammatory disorders of ovary, fallopian tube and broad ligament: Secondary | ICD-10-CM | POA: Diagnosis not present

## 2023-05-20 DIAGNOSIS — E1165 Type 2 diabetes mellitus with hyperglycemia: Secondary | ICD-10-CM

## 2023-05-20 DIAGNOSIS — Z01812 Encounter for preprocedural laboratory examination: Secondary | ICD-10-CM

## 2023-05-20 DIAGNOSIS — E282 Polycystic ovarian syndrome: Secondary | ICD-10-CM

## 2023-05-20 HISTORY — PX: ROBOTIC ASSISTED LAPAROSCOPIC HYSTERECTOMY AND SALPINGECTOMY: SHX6379

## 2023-05-20 LAB — GLUCOSE, CAPILLARY
Glucose-Capillary: 132 mg/dL — ABNORMAL HIGH (ref 70–99)
Glucose-Capillary: 176 mg/dL — ABNORMAL HIGH (ref 70–99)

## 2023-05-20 LAB — POCT PREGNANCY, URINE: Preg Test, Ur: NEGATIVE

## 2023-05-20 SURGERY — XI ROBOTIC ASSISTED LAPAROSCOPIC HYSTERECTOMY AND SALPINGECTOMY
Anesthesia: General | Site: Uterus

## 2023-05-20 MED ORDER — FENTANYL CITRATE (PF) 100 MCG/2ML IJ SOLN
25.0000 ug | INTRAMUSCULAR | Status: DC | PRN
  Administered 2023-05-20: 50 ug via INTRAVENOUS
  Administered 2023-05-20 (×2): 25 ug via INTRAVENOUS

## 2023-05-20 MED ORDER — CHLORHEXIDINE GLUCONATE 0.12 % MT SOLN
15.0000 mL | Freq: Once | OROMUCOSAL | Status: AC
Start: 2023-05-20 — End: 2023-05-20
  Administered 2023-05-20: 15 mL via OROMUCOSAL

## 2023-05-20 MED ORDER — TRANEXAMIC ACID-NACL 1000-0.7 MG/100ML-% IV SOLN
INTRAVENOUS | Status: AC
  Filled 2023-05-20: qty 100

## 2023-05-20 MED ORDER — POVIDONE-IODINE 10 % EX SWAB: 2.0000 | Freq: Once | CUTANEOUS | Status: DC

## 2023-05-20 MED ORDER — CELECOXIB 200 MG PO CAPS
ORAL_CAPSULE | ORAL | Status: AC
  Filled 2023-05-20: qty 2

## 2023-05-20 MED ORDER — GABAPENTIN 300 MG PO CAPS
ORAL_CAPSULE | ORAL | Status: AC
  Filled 2023-05-20: qty 1

## 2023-05-20 MED ORDER — PROPOFOL 10 MG/ML IV BOLUS
INTRAVENOUS | Status: DC | PRN
  Administered 2023-05-20: 150 mg via INTRAVENOUS

## 2023-05-20 MED ORDER — BUPIVACAINE HCL (PF) 0.5 % IJ SOLN
INTRAMUSCULAR | Status: AC
  Filled 2023-05-20: qty 30

## 2023-05-20 MED ORDER — PROPOFOL 10 MG/ML IV BOLUS
INTRAVENOUS | Status: AC
  Filled 2023-05-20: qty 20

## 2023-05-20 MED ORDER — IBUPROFEN 600 MG PO TABS: 600.0000 mg | ORAL_TABLET | Freq: Four times a day (QID) | ORAL | 0 refills | Status: DC | PRN

## 2023-05-20 MED ORDER — ORAL CARE MOUTH RINSE: 15.0000 mL | Freq: Once | OROMUCOSAL | Status: AC

## 2023-05-20 MED ORDER — ROCURONIUM BROMIDE 100 MG/10ML IV SOLN
INTRAVENOUS | Status: DC | PRN
  Administered 2023-05-20: 20 mg via INTRAVENOUS
  Administered 2023-05-20: 15 mg via INTRAVENOUS
  Administered 2023-05-20: 10 mg via INTRAVENOUS
  Administered 2023-05-20: 20 mg via INTRAVENOUS
  Administered 2023-05-20: 50 mg via INTRAVENOUS

## 2023-05-20 MED ORDER — CEFAZOLIN SODIUM-DEXTROSE 2-4 GM/100ML-% IV SOLN
INTRAVENOUS | Status: AC
  Filled 2023-05-20: qty 100

## 2023-05-20 MED ORDER — KETOROLAC TROMETHAMINE 30 MG/ML IJ SOLN
INTRAMUSCULAR | Status: DC | PRN
  Administered 2023-05-20: 30 mg via INTRAVENOUS

## 2023-05-20 MED ORDER — BUPIVACAINE HCL 0.5 % IJ SOLN
INTRAMUSCULAR | Status: DC | PRN
  Administered 2023-05-20: 30 mL

## 2023-05-20 MED ORDER — PHENYLEPHRINE 80 MCG/ML (10ML) SYRINGE FOR IV PUSH (FOR BLOOD PRESSURE SUPPORT)
PREFILLED_SYRINGE | INTRAVENOUS | Status: AC
  Filled 2023-05-20: qty 10

## 2023-05-20 MED ORDER — LIDOCAINE HCL (PF) 2 % IJ SOLN
INTRAMUSCULAR | Status: AC
  Filled 2023-05-20: qty 5

## 2023-05-20 MED ORDER — ACETAMINOPHEN 500 MG PO TABS
ORAL_TABLET | ORAL | Status: AC
  Filled 2023-05-20: qty 2

## 2023-05-20 MED ORDER — TRANEXAMIC ACID-NACL 1000-0.7 MG/100ML-% IV SOLN
1000.0000 mg | Freq: Once | INTRAVENOUS | Status: AC
  Administered 2023-05-20: 1000 mg via INTRAVENOUS

## 2023-05-20 MED ORDER — OXYCODONE HCL 5 MG PO TABS
ORAL_TABLET | ORAL | Status: AC
  Filled 2023-05-20: qty 1

## 2023-05-20 MED ORDER — CELECOXIB 200 MG PO CAPS
400.0000 mg | ORAL_CAPSULE | ORAL | Status: AC
  Administered 2023-05-20: 400 mg via ORAL

## 2023-05-20 MED ORDER — FENTANYL CITRATE (PF) 100 MCG/2ML IJ SOLN
INTRAMUSCULAR | Status: DC | PRN
  Administered 2023-05-20 (×2): 25 ug via INTRAVENOUS
  Administered 2023-05-20: 50 ug via INTRAVENOUS

## 2023-05-20 MED ORDER — PHENYLEPHRINE 80 MCG/ML (10ML) SYRINGE FOR IV PUSH (FOR BLOOD PRESSURE SUPPORT)
PREFILLED_SYRINGE | INTRAVENOUS | Status: DC | PRN
  Administered 2023-05-20 (×2): 80 ug via INTRAVENOUS
  Administered 2023-05-20: 160 ug via INTRAVENOUS
  Administered 2023-05-20: 80 ug via INTRAVENOUS

## 2023-05-20 MED ORDER — CHLORHEXIDINE GLUCONATE 0.12 % MT SOLN
OROMUCOSAL | Status: AC
  Filled 2023-05-20: qty 15

## 2023-05-20 MED ORDER — GABAPENTIN 300 MG PO CAPS
300.0000 mg | ORAL_CAPSULE | ORAL | Status: AC
  Administered 2023-05-20: 300 mg via ORAL

## 2023-05-20 MED ORDER — SUGAMMADEX SODIUM 200 MG/2ML IV SOLN
INTRAVENOUS | Status: DC | PRN
  Administered 2023-05-20: 200 mg via INTRAVENOUS

## 2023-05-20 MED ORDER — FENTANYL CITRATE (PF) 100 MCG/2ML IJ SOLN
INTRAMUSCULAR | Status: AC
  Filled 2023-05-20: qty 2

## 2023-05-20 MED ORDER — CEFAZOLIN SODIUM-DEXTROSE 2-4 GM/100ML-% IV SOLN
2.0000 g | INTRAVENOUS | Status: AC
  Administered 2023-05-20: 2 g via INTRAVENOUS

## 2023-05-20 MED ORDER — LACTATED RINGERS IV SOLN: INTRAVENOUS | Status: DC

## 2023-05-20 MED ORDER — DROPERIDOL 2.5 MG/ML IJ SOLN
INTRAMUSCULAR | Status: AC
  Filled 2023-05-20: qty 2

## 2023-05-20 MED ORDER — LIDOCAINE HCL (CARDIAC) PF 100 MG/5ML IV SOSY
PREFILLED_SYRINGE | INTRAVENOUS | Status: DC | PRN
  Administered 2023-05-20: 80 mg via INTRAVENOUS

## 2023-05-20 MED ORDER — KETOROLAC TROMETHAMINE 30 MG/ML IJ SOLN
INTRAMUSCULAR | Status: AC
  Filled 2023-05-20: qty 1

## 2023-05-20 MED ORDER — 0.9 % SODIUM CHLORIDE (POUR BTL) OPTIME
TOPICAL | Status: DC | PRN
  Administered 2023-05-20: 500 mL

## 2023-05-20 MED ORDER — MICROFIBRILLAR COLL HEMOSTAT EX PADS
MEDICATED_PAD | CUTANEOUS | Status: DC | PRN
  Administered 2023-05-20: 1 via TOPICAL

## 2023-05-20 MED ORDER — OXYCODONE-ACETAMINOPHEN 5-325 MG PO TABS
1.0000 | ORAL_TABLET | Freq: Four times a day (QID) | ORAL | 0 refills | Status: DC | PRN
Start: 2023-05-20 — End: 2024-02-26

## 2023-05-20 MED ORDER — MIDAZOLAM HCL 2 MG/2ML IJ SOLN
INTRAMUSCULAR | Status: DC | PRN
  Administered 2023-05-20: 2 mg via INTRAVENOUS

## 2023-05-20 MED ORDER — DROPERIDOL 2.5 MG/ML IJ SOLN
0.6250 mg | Freq: Once | INTRAMUSCULAR | Status: AC | PRN
  Administered 2023-05-20: 0.625 mg via INTRAVENOUS

## 2023-05-20 MED ORDER — ONDANSETRON HCL 4 MG/2ML IJ SOLN
INTRAMUSCULAR | Status: AC
  Filled 2023-05-20: qty 2

## 2023-05-20 MED ORDER — ACETAMINOPHEN 500 MG PO TABS
1000.0000 mg | ORAL_TABLET | ORAL | Status: AC
  Administered 2023-05-20: 1000 mg via ORAL

## 2023-05-20 MED ORDER — MIDAZOLAM HCL 2 MG/2ML IJ SOLN
INTRAMUSCULAR | Status: AC
  Filled 2023-05-20: qty 2

## 2023-05-20 MED ORDER — OXYCODONE HCL 5 MG PO TABS
5.0000 mg | ORAL_TABLET | Freq: Once | ORAL | Status: AC
  Administered 2023-05-20: 5 mg via ORAL

## 2023-05-20 MED ORDER — DEXAMETHASONE SODIUM PHOSPHATE 10 MG/ML IJ SOLN
INTRAMUSCULAR | Status: AC
  Filled 2023-05-20: qty 1

## 2023-05-20 MED ORDER — DEXAMETHASONE SODIUM PHOSPHATE 10 MG/ML IJ SOLN
INTRAMUSCULAR | Status: DC | PRN
  Administered 2023-05-20: 10 mg via INTRAVENOUS

## 2023-05-20 SURGICAL SUPPLY — 78 items
ADH SKN CLS APL DERMABOND .7 (GAUZE/BANDAGES/DRESSINGS) ×2
BAG DRN RND TRDRP ANRFLXCHMBR (UROLOGICAL SUPPLIES) ×2
BAG URINE DRAIN 2000ML AR STRL (UROLOGICAL SUPPLIES) ×2 IMPLANT
BLADE SURG SZ11 CARB STEEL (BLADE) ×2 IMPLANT
CANNULA CAP OBTURATR AIRSEAL 8 (CAP) ×2 IMPLANT
CATH FOLEY 2WAY 5CC 16FR (CATHETERS) ×2
CATH URTH 16FR FL 2W BLN LF (CATHETERS) ×2 IMPLANT
COVER TIP SHEARS 8 DVNC (MISCELLANEOUS) ×2 IMPLANT
COVER WAND RF STERILE (DRAPES) IMPLANT
DERMABOND ADVANCED .7 DNX12 (GAUZE/BANDAGES/DRESSINGS) ×2 IMPLANT
DRAPE ARM DVNC X/XI (DISPOSABLE) ×8 IMPLANT
DRAPE COLUMN DVNC XI (DISPOSABLE) ×2 IMPLANT
DRAPE ROBOT W/ LEGGING 30X125 (DRAPES) ×2 IMPLANT
DRIVER NDL LRG 8 DVNC XI (INSTRUMENTS) ×1 IMPLANT
DRIVER NDL MEGA 8 DVNC XI (INSTRUMENTS) IMPLANT
DRIVER NDLE LRG 8 DVNC XI (INSTRUMENTS) ×2 IMPLANT
DRIVER NDLE MEGA DVNC XI (INSTRUMENTS) ×2 IMPLANT
ELECT REM PT RETURN 9FT ADLT (ELECTROSURGICAL) ×2
ELECTRODE REM PT RTRN 9FT ADLT (ELECTROSURGICAL) ×2 IMPLANT
FORCEPS BPLR FENES DVNC XI (FORCEP) ×2 IMPLANT
FORCEPS BPLR R/ABLATION 8 DVNC (INSTRUMENTS) ×2 IMPLANT
GAUZE 4X4 16PLY ~~LOC~~+RFID DBL (SPONGE) ×2 IMPLANT
GLOVE BIO SURGEON STRL SZ 6.5 (GLOVE) ×4 IMPLANT
GLOVE INDICATOR 7.0 STRL GRN (GLOVE) ×6 IMPLANT
GLOVE PI ORTHO PRO STRL 7.5 (GLOVE) IMPLANT
GOWN STRL REUS W/ TWL LRG LVL3 (GOWN DISPOSABLE) ×6 IMPLANT
GOWN STRL REUS W/TWL LRG LVL3 (GOWN DISPOSABLE) ×6
GRASPER SUT TROCAR 14GX15 (MISCELLANEOUS) ×2 IMPLANT
GYRUS RUMI II 3.5CM BLUE (DISPOSABLE) ×2
HEMOSTAT SURGICEL 2X14 (HEMOSTASIS) ×1 IMPLANT
IRRIGATION STRYKERFLOW (MISCELLANEOUS) ×1 IMPLANT
IRRIGATOR STRYKERFLOW (MISCELLANEOUS) ×2
IV NS 1000ML (IV SOLUTION)
IV NS 1000ML BAXH (IV SOLUTION) IMPLANT
KIT PINK PAD W/HEAD ARE REST (MISCELLANEOUS) ×2
KIT PINK PAD W/HEAD ARM REST (MISCELLANEOUS) ×2 IMPLANT
LABEL OR SOLS (LABEL) ×2 IMPLANT
MANIFOLD NEPTUNE II (INSTRUMENTS) ×2 IMPLANT
MANIPULATOR VCARE LG CRV RETR (MISCELLANEOUS) IMPLANT
MANIPULATOR VCARE SML CRV RETR (MISCELLANEOUS) IMPLANT
MANIPULATOR VCARE STD CRV RETR (MISCELLANEOUS) IMPLANT
NDL INSUFFLATION 14GA 120MM (NEEDLE) ×1 IMPLANT
NEEDLE INSUFFLATION 14GA 120MM (NEEDLE) ×2 IMPLANT
NS IRRIG 1000ML POUR BTL (IV SOLUTION) ×2 IMPLANT
OBTURATOR OPTICAL STND 8 DVNC (TROCAR) ×2
OBTURATOR OPTICALSTD 8 DVNC (TROCAR) ×2 IMPLANT
OCCLUDER COLPOPNEUMO (BALLOONS) ×2 IMPLANT
PACK GYN LAPAROSCOPIC (MISCELLANEOUS) ×2 IMPLANT
PORT ACCESS TROCAR AIRSEAL 12 (TROCAR) IMPLANT
PORT ACCESS TROCAR AIRSEAL 5 (TROCAR) IMPLANT
RUMI II GYRUS 3.5CM BLUE (DISPOSABLE) ×1 IMPLANT
SCISSORS MNPLR CVD DVNC XI (INSTRUMENTS) ×2 IMPLANT
SCRUB CHG 4% DYNA-HEX 4OZ (MISCELLANEOUS) ×2 IMPLANT
SEAL UNIV 5-12 XI (MISCELLANEOUS) ×6 IMPLANT
SEALER VESSEL EXT DVNC XI (MISCELLANEOUS) ×1 IMPLANT
SET CYSTO W/LG BORE CLAMP LF (SET/KITS/TRAYS/PACK) IMPLANT
SET TRI-LUMEN FLTR TB AIRSEAL (TUBING) IMPLANT
SET TUBE FILTERED XL AIRSEAL (SET/KITS/TRAYS/PACK) IMPLANT
SOL ELECTROSURG ANTI STICK (MISCELLANEOUS) ×2
SOL PREP PVP 2OZ (MISCELLANEOUS) ×2
SOLUTION ELECTROSURG ANTI STCK (MISCELLANEOUS) ×2 IMPLANT
SOLUTION PREP PVP 2OZ (MISCELLANEOUS) ×2 IMPLANT
SURGILUBE 2OZ TUBE FLIPTOP (MISCELLANEOUS) ×2 IMPLANT
SUT DVC VLOC 180 0 12IN GS21 (SUTURE) ×2
SUT MNCRL 4-0 (SUTURE) ×2
SUT MNCRL 4-0 27XMFL (SUTURE) ×2
SUT MNCRL AB 4-0 PS2 18 (SUTURE) ×1 IMPLANT
SUT VIC AB 2-0 CT1 27 (SUTURE) ×2
SUT VIC AB 2-0 CT1 TAPERPNT 27 (SUTURE) ×2 IMPLANT
SUT VICRYL 0 UR6 27IN ABS (SUTURE) ×2 IMPLANT
SUT VLOC 180 0 6IN GS21 (SUTURE) ×2 IMPLANT
SUTURE DVC VLC 180 0 12IN GS21 (SUTURE) ×2 IMPLANT
SUTURE MNCRL 4-0 27XMF (SUTURE) ×2 IMPLANT
SYR 10ML LL (SYRINGE) ×2 IMPLANT
SYR 50ML LL SCALE MARK (SYRINGE) ×2 IMPLANT
TIP UTERINE 6.7X10CM GRN DISP (MISCELLANEOUS) ×1 IMPLANT
TROCAR Z-THREAD FIOS 5X100MM (TROCAR) ×1 IMPLANT
WATER STERILE IRR 500ML POUR (IV SOLUTION) ×2 IMPLANT

## 2023-05-20 NOTE — Op Note (Signed)
Procedure(s): XI ROBOTIC ASSISTED TOTAL LAPAROSCOPIC HYSTERECTOMY AND BILATERAL SALPINGECTOMY Procedure Note  Priscilla King female 47 y.o. 05/20/2023  Indications: The patient is a 47 y.o. [id number] female with dysfunctional uterine bleeding, fibroid uterus, s/p failed endometrial ablation for management of bleeding (performed 01/2023).   She presents today for definitive therapy with hysterectomy.    Pre-operative Diagnosis:  Dysfunctional uterine bleeding, fibroid uterus, history of failed endometrial ablation   Post-operative Diagnosis: Same  Surgeon: Hildred Laser, MD  Assistants:   Circulator: Benard Rink, RN; Bonita Quin, Irving Burton, RN Relief Scrub: Esmond Plants Scrub Person: Eber Hong Circulator Assistant: Norville Haggard, RN RN First Assistant: Waymon Amato, RN   Anesthesia: General endotracheal anesthesia  Findings: The uterus was sounded to 11 cm.  Fallopian tubes and ovaries appeared normal. Likely submucosal vs intramural fibroid noted posteriorly.  Adhesions of omentum to mid abdominal wall noted centrally.   Procedure Details: The patient was seen in the Holding Room. The risks, benefits, complications, treatment options, and expected outcomes were discussed with the patient.  The patient concurred with the proposed plan, giving informed consent.  The site of surgery properly noted/marked. The patient was taken to the Operating Room, identified as Priscilla King and the procedure verified as Procedure(s) (LRB): XI ROBOTIC ASSISTED TOTAL LAPAROSCOPIC HYSTERECTOMY AND BILATERAL SALPINGECTOMY (N/A).  She was then placed under general anesthesia without difficulty. She was placed in the dorsal lithotomy position, and was prepped and draped in a sterile manner.   A Time Out was held and the above information confirmed.  The patient received a prophylactic dose of 1 gram of tranexamic acid  to limit surgical blood loss.  Foley catheter was placed. A  sterile speculum was inserted into the vagina and the cervix was grasped at the anterior lip using a single-toothed tenaculum.  The uterus was sounded to 11 cm, and the cervix was sized for a 3.5 cm RUMI cup. The RUMI uterine manipulator was placed per manufacturer instructions. The speculum and tenaculum were then removed.   Attention was then turned to the abdomen, where  an 8 mm incision was made supraumubilcally.  An 8 mm robotic trochar port with sleeve were then advanced under direct visualization using a 5-mm laparoscope.   Three more ports were then placed. There were two 8 mm ports that were placed 10 cm laterally to the umbilicus and 2 cm inferiorly on either side.  The 5 mm assistant port was then placed in the right lower quadrant 2 cm medial and superiorr to the iliac crest. All incisions were injected with local anesthetic (Sensorcaine 0.5%, total of 30 cc) prior to port placement. The daVinci robot was then docked in the normal fashion.  The daVinci camera was then placed supraumbilically.The patient was placed in steep Trendelenburg positioning.  Inspection of the pelvis showed a overall normal uterus except bulge noted posteriorly, likely site of uterine fibroid (deep submucosal vs intramural in location).  Normal ovaries and tubes. The adhesions of the omentum to the anterior abdominal wall were dissected using the laparoscopic endoshears. The right mesosalpinx of the fallopian tube was cauterized and cut using the vessel sealer device.  The utero-ovarian ligament was also coagulated and cut. The round ligament was coagulated and cut. A bladder flap was created and the bladder was dissected down from the cervix.This entire procedure was then repeated on the left side.   The uterine arteries were then skeletonized, and cauterized using the vessel sealer device.  The  blue balloon cuff was then identified and anincision was made in the cervicovaginal junction on top of the vaginal cuff. This was  also repeated posteriorly. The incision was extended laterally, freeing the uterus from the surrounding vagina. The uterus was then delivered posteriorly through the vagina using the robotic assistant.    The vaginal cuff was closed with a running suture of 0 Vicryl V-lock. The ureters were identified bilaterally. The entire pelvis was hemostatic. The gas pressure was then decreased to 5 mmHg to assess for any occult bleeding. Scant bleeding noted on the left edge of the vaginal repair. This was cauterized.  Surgicele was placed over the vaginal cuff.   All instruments were removed from the abdomen and the robot was undocked. All skin incisions skin were closed with 4-0 Monocryl using subcuticular stitches. Dermabond was placed over all incisions.  The final needle, sponge, and instrument count was correct. The patient tolerated the procedure well. Patient to the recovery room in good condition.      Estimated Blood Loss:  10 ml      Drains: foley catheterization with  200 ml of clear urine at end of the procedure.          Total IV Fluids:  Total I/O In: 1000 [I.V.:800; IV Piggyback:200] Out: 110 [Urine:100; Blood:10]  ml  Specimens: Uterus with cervix, bilateral fallopian tubes          Implants: None         Complications:  None; patient tolerated the procedure well.         Disposition: PACU - hemodynamically stable.         Condition: stable    Hildred Laser, MD [facility] at [facility]

## 2023-05-20 NOTE — Anesthesia Procedure Notes (Signed)
Procedure Name: Intubation Date/Time: 05/20/2023 11:25 AM  Performed by: Elisabeth Pigeon, CRNAPre-anesthesia Checklist: Patient identified, Patient being monitored, Timeout performed, Emergency Drugs available and Suction available Patient Re-evaluated:Patient Re-evaluated prior to induction Oxygen Delivery Method: Circle system utilized Preoxygenation: Pre-oxygenation with 100% oxygen Induction Type: IV induction Ventilation: Mask ventilation without difficulty Laryngoscope Size: Mac, 3 and McGraph Grade View: Grade I Tube type: Oral Tube size: 6.5 mm Number of attempts: 1 Airway Equipment and Method: Stylet Placement Confirmation: ETT inserted through vocal cords under direct vision, positive ETCO2 and breath sounds checked- equal and bilateral Secured at: 21 cm Tube secured with: Tape Dental Injury: Teeth and Oropharynx as per pre-operative assessment

## 2023-05-20 NOTE — Anesthesia Preprocedure Evaluation (Signed)
Anesthesia Evaluation  Patient identified by MRN, date of birth, ID band Patient awake    Reviewed: Allergy & Precautions, NPO status , Patient's Chart, lab work & pertinent test results  History of Anesthesia Complications Negative for: history of anesthetic complications  Airway Mallampati: III  TM Distance: >3 FB Neck ROM: full    Dental no notable dental hx.    Pulmonary neg pulmonary ROS   Pulmonary exam normal        Cardiovascular negative cardio ROS Normal cardiovascular exam     Neuro/Psych  PSYCHIATRIC DISORDERS  Depression    negative neurological ROS     GI/Hepatic Neg liver ROS,GERD  Controlled,,  Endo/Other  diabetes, Type 2    Renal/GU      Musculoskeletal   Abdominal   Peds  Hematology negative hematology ROS (+)   Anesthesia Other Findings Past Medical History: No date: Anemia No date: Arthritis 01/13/2023: COVID-19     Comment:  symptoms completely resolved as of 01-18-23 No date: Depression 06/2014: Diabetes mellitus type 2, uncontrolled, without complications No date: GERD (gastroesophageal reflux disease) No date: History of polycystic ovaries 06/2014: Hyperlipidemia LDL goal <70 No date: Kienbock's avascular necrosis of lunate, adult No date: Menorrhagia with regular cycle 08/14/2021: Morbid obesity with BMI of 40.0-44.9, adult [facility])  Past Surgical History: 08/31/2012: CESAREAN SECTION; N/A     Comment:  Procedure: CESAREAN SECTION;  Surgeon: Catalina Antigua,               MD;  Location: [facility] ORS;  Service: Obstetrics;  Laterality:              N/A; 03/20/2017: CESAREAN SECTION; N/A     Comment:  Procedure: REPEAT CESAREAN SECTION;  Surgeon: Hildred Laser, MD;  Location: [facility] ORS;  Service: Obstetrics;                Laterality: N/A;  Female born @ 1500 Apgars:               8/9 Weight:10lbs 9ozs 11/01/2021: COLONOSCOPY WITH PROPOFOL; N/A     Comment:  Procedure:  COLONOSCOPY WITH PROPOFOL;  Surgeon: Wyline Mood, MD;  Location: [facility] ENDOSCOPY;  Service:               Gastroenterology;  Laterality: N/A;  BMI    Body Mass Index: 32.54 kg/m      Reproductive/Obstetrics negative OB ROS                              Anesthesia Physical Anesthesia Plan  ASA: 2  Anesthesia Plan: General ETT   Post-op Pain Management: Tylenol PO (pre-op)* and Celebrex PO (pre-op)*   Induction: Intravenous  PONV Risk Score and Plan: 3 and Ondansetron, Dexamethasone, Midazolam and Treatment may vary due to age or medical condition  Airway Management Planned: Oral ETT  Additional Equipment:   Intra-op Plan:   Post-operative Plan: Extubation in OR  Informed Consent: I have reviewed the patients History and Physical, chart, labs and discussed the procedure including the risks, benefits and alternatives for the proposed anesthesia with the patient or authorized representative who has indicated his/her understanding and acceptance.     Dental Advisory Given  Plan Discussed with: Anesthesiologist, CRNA and Surgeon  Anesthesia Plan Comments: (Patient consented for risks  of anesthesia including but not limited to:  - adverse reactions to medications - damage to eyes, teeth, lips or other oral mucosa - nerve damage due to positioning  - sore throat or hoarseness - Damage to heart, brain, nerves, lungs, other parts of body or loss of life  Patient voiced understanding.)         Anesthesia Quick Evaluation

## 2023-05-20 NOTE — Transfer of Care (Signed)
Immediate Anesthesia Transfer of Care Note  Patient: Priscilla King  Procedure(s) Performed: XI ROBOTIC ASSISTED TOTAL LAPAROSCOPIC HYSTERECTOMY AND BILATERAL SALPINGECTOMY (Uterus)  Patient Location: PACU  Anesthesia Type:General  Level of Consciousness: awake  Airway & Oxygen Therapy: Patient Spontanous Breathing and Patient connected to face mask oxygen  Post-op Assessment: Report given to RN and Post -op Vital signs reviewed and stable  Post vital signs: Reviewed and stable  Last Vitals:  Vitals Value Taken Time  BP 123/71 05/20/23 1417  Temp    Pulse 95 05/20/23 1424  Resp 18 05/20/23 1424  SpO2 100 % 05/20/23 1424  Vitals shown include unfiled device data.  Last Pain:  Vitals:   05/20/23 0900  TempSrc: Temporal  PainSc: 0-No pain         Complications: No notable events documented.

## 2023-05-20 NOTE — Discharge Instructions (Addendum)
General Gynecological Post-Operative Instructions You may expect to feel dizzy, weak, and drowsy for as long as 24 hours after receiving the medicine that made you sleep (anesthetic).  Do not drive a car, ride a bicycle, participate in physical activities, or take public transportation until you are done taking narcotic pain medicines or as directed by your doctor.  Do not drink alcohol or take tranquilizers.  Do not take medicine that has not been prescribed by your doctor.  Do not sign important papers or make important decisions while on narcotic pain medicines.  Have a responsible person with you.  CARE OF INCISION  Keep incision clean and dry. Take showers instead of baths until your doctor gives you permission to take baths.  Avoid heavy lifting (more than 10 pounds/4.5 kilograms), pushing, or pulling.  Avoid activities that may risk injury to your surgical site.  No sexual intercourse or placement of anything in the vagina for 6-8 weeks or as instructed by your doctor. If you have tubes coming from the wound site, check with your doctor regarding appropriate care of the tubes. Only take prescription or over-the-counter medicines  for pain, discomfort, or fever as directed by your doctor. Do not take aspirin. It can make you bleed. Take medicines (antibiotics) that kill germs if they are prescribed for you.  Call the office or go to the Emergency Room if:  You feel sick to your stomach (nauseous).  You start to throw up (vomit).  You have trouble eating or drinking.  You have an oral temperature above 101.  You have constipation that is not helped by adjusting diet or increasing fluid intake. Pain medicines are a common cause of constipation.  You have any other concerns. SEEK IMMEDIATE MEDICAL CARE IF:  You have persistent dizziness.  You have difficulty breathing or a congested sounding (croupy) cough.  You have an oral temperature above 102.5, not controlled by medicine.  There is  increasing pain or tenderness near or in the surgical site.   AMBULATORY SURGERY  DISCHARGE INSTRUCTIONS  The drugs that you were given will stay in your system until tomorrow so for the next 24 hours you should not:  Drive an automobile Make any legal decisions Drink any alcoholic beverage  You may resume regular meals tomorrow.  Today it is better to start with liquids and gradually work up to solid foods.  You may eat anything you prefer, but it is better to start with liquids, then soup and crackers, and gradually work up to solid foods.  Please notify your doctor immediately if you have any unusual bleeding, trouble breathing, redness and pain at the surgery site, drainage, fever, or pain not relieved by medication.  Additional Instructions:  Please contact your physician with any problems or Same Day Surgery at [phone], Monday through Friday 6 am to 4 pm, or [facility] number at [phone].

## 2023-05-20 NOTE — Interval H&P Note (Signed)
History and Physical Interval Note:  05/20/2023 10:04 AM  Priscilla King  has presented today for surgery, with the diagnosis of Menorrhagia with regular cycle, fibroid uterus.  The various methods of treatment have been discussed with the patient and family. After consideration of risks, benefits and other options for treatment, the patient has consented to  Procedure(s): XI ROBOTIC ASSISTED TOTAL LAPAROSCOPIC HYSTERECTOMY (N/A) WITH BILATERAL SALPINGECTOMY as a surgical intervention.  The patient's history has been reviewed, patient examined, no change in status, stable for surgery.  I have reviewed the patient's chart and labs.  Questions were answered to the patient's satisfaction.     Hildred Laser, MD [facility] at [facility]

## 2023-05-21 ENCOUNTER — Encounter: Payer: Self-pay | Admitting: Obstetrics and Gynecology

## 2023-05-22 LAB — SURGICAL PATHOLOGY

## 2023-05-27 NOTE — Anesthesia Postprocedure Evaluation (Signed)
Anesthesia Post Note  Patient: Priscilla King  Procedure(s) Performed: XI ROBOTIC ASSISTED TOTAL LAPAROSCOPIC HYSTERECTOMY AND BILATERAL SALPINGECTOMY (Uterus)  Patient location during evaluation: PACU Anesthesia Type: General Level of consciousness: awake and alert Pain management: pain level controlled Vital Signs Assessment: post-procedure vital signs reviewed and stable Respiratory status: spontaneous breathing, nonlabored ventilation, respiratory function stable and patient connected to nasal cannula oxygen Cardiovascular status: blood pressure returned to baseline and stable Postop Assessment: no apparent nausea or vomiting Anesthetic complications: no   No notable events documented.   Last Vitals:  Vitals:   05/20/23 1500 05/20/23 1546  BP: 105/69 110/62  Pulse: 89 87  Resp: 10 16  Temp:  36.4 C  SpO2: 98% 99%    Last Pain:  Vitals:   05/21/23 1001  TempSrc:   PainSc: 3                  Lenard Simmer

## 2023-05-28 NOTE — Progress Notes (Unsigned)
    OBSTETRICS/GYNECOLOGY POST-OPERATIVE CLINIC VISIT  Subjective:     Priscilla King is a 47 y.o. female who presents to the clinic 1 weeks status post XI ROBOTIC ASSISTED TOTAL LAPAROSCOPIC HYSTERECTOMY AND BILATERAL SALPINGECTOMY  for Menorrhagia with regular cycle, fibroid uterus . Eating a regular diet {with-without:5700} difficulty. Bowel movements are {normal/abnormal***:19619}. {pain control:13522::"The patient is not having any pain."}  {Common ambulatory SmartLinks:19316}  Review of Systems {ros; complete:30496}   Objective:   There were no vitals taken for this visit. There is no height or weight on file to calculate BMI.  General:  alert and no distress  Abdomen: soft, bowel sounds active, non-tender  Incision:   {incision:13716::"no dehiscence","incision well approximated","healing well","no drainage","no erythema","no hernia","no seroma","no swelling"}    Pathology:    Assessment:   Patient s/p XI ROBOTIC ASSISTED TOTAL LAPAROSCOPIC HYSTERECTOMY AND BILATERAL SALPINGECTOMY  (surgery)  {doing well:13525::"Doing well postoperatively."}   Plan:   1. Continue any current medications as instructed by provider. 2. Wound care discussed. 3. Operative findings again reviewed. Pathology report discussed. 4. Activity restrictions: {restrictions:13723} 5. Anticipated return to work: {work return:14002}. 6. Follow up: {6-64:40347} {time; units:18646} for ***    Hildred Laser, MD [facility]

## 2023-05-29 ENCOUNTER — Encounter: Payer: Self-pay | Admitting: Obstetrics and Gynecology

## 2023-05-29 ENCOUNTER — Ambulatory Visit (INDEPENDENT_AMBULATORY_CARE_PROVIDER_SITE_OTHER): Payer: BC Managed Care – PPO | Admitting: Obstetrics and Gynecology

## 2023-05-29 VITALS — BP 139/90 | HR 80 | Resp 16 | Ht 68.0 in | Wt 232.7 lb

## 2023-05-29 DIAGNOSIS — L7682 Other postprocedural complications of skin and subcutaneous tissue: Secondary | ICD-10-CM

## 2023-05-29 DIAGNOSIS — Z4889 Encounter for other specified surgical aftercare: Secondary | ICD-10-CM

## 2023-05-29 DIAGNOSIS — D251 Intramural leiomyoma of uterus: Secondary | ICD-10-CM

## 2023-05-29 DIAGNOSIS — Z90710 Acquired absence of both cervix and uterus: Secondary | ICD-10-CM

## 2023-05-29 DIAGNOSIS — Z8742 Personal history of other diseases of the female genital tract: Secondary | ICD-10-CM

## 2023-06-03 ENCOUNTER — Telehealth: Payer: Self-pay

## 2023-06-03 DIAGNOSIS — F331 Major depressive disorder, recurrent, moderate: Secondary | ICD-10-CM | POA: Diagnosis not present

## 2023-06-03 DIAGNOSIS — F411 Generalized anxiety disorder: Secondary | ICD-10-CM | POA: Diagnosis not present

## 2023-06-03 NOTE — Telephone Encounter (Signed)
Called Priscilla King and letting her know most companies have a return to work note, If not I can type one up. Left voicemail.

## 2023-06-10 DIAGNOSIS — F411 Generalized anxiety disorder: Secondary | ICD-10-CM | POA: Diagnosis not present

## 2023-06-10 DIAGNOSIS — F331 Major depressive disorder, recurrent, moderate: Secondary | ICD-10-CM | POA: Diagnosis not present

## 2023-06-24 DIAGNOSIS — F331 Major depressive disorder, recurrent, moderate: Secondary | ICD-10-CM | POA: Diagnosis not present

## 2023-06-24 DIAGNOSIS — F411 Generalized anxiety disorder: Secondary | ICD-10-CM | POA: Diagnosis not present

## 2023-06-25 ENCOUNTER — Other Ambulatory Visit: Payer: Self-pay | Admitting: Family Medicine

## 2023-06-26 ENCOUNTER — Encounter: Payer: BC Managed Care – PPO | Admitting: Obstetrics and Gynecology

## 2023-06-27 ENCOUNTER — Encounter: Payer: Self-pay | Admitting: Obstetrics and Gynecology

## 2023-06-27 ENCOUNTER — Ambulatory Visit: Payer: BC Managed Care – PPO | Admitting: Obstetrics and Gynecology

## 2023-06-27 VITALS — BP 151/85 | HR 80 | Resp 16 | Ht 68.0 in | Wt 232.6 lb

## 2023-06-27 DIAGNOSIS — Z4889 Encounter for other specified surgical aftercare: Secondary | ICD-10-CM

## 2023-06-27 DIAGNOSIS — Z90710 Acquired absence of both cervix and uterus: Secondary | ICD-10-CM

## 2023-06-27 NOTE — Progress Notes (Signed)
    OBSTETRICS/GYNECOLOGY POST-OPERATIVE CLINIC VISIT  Subjective:     Priscilla King is a 47 y.o. female who presents to the clinic 5 weeks status post XI ROBOTIC ASSISTED TOTAL LAPAROSCOPIC HYSTERECTOMY AND BILATERAL SALPINGECTOMY  for  Menorrhagia with regular cycle, fibroid uterus . Eating a regular diet without difficulty. Bowel movements are normal. Pain is controlled without any medications.  Was noting some spotting up until last week and mild dull achy pain on her right lower side but otherwise doing ok.  States that is also still experiencing some intermittent bleeding at her umbilical incision site.   The following portions of the patient's history were reviewed and updated as appropriate: allergies, current medications, past family history, past medical history, past social history, past surgical history, and problem list.  Review of Systems Pertinent items noted in HPI and remainder of comprehensive ROS otherwise negative.   Objective:   BP (!) 151/85   Pulse 80   Resp 16   Ht 5\' 8"  (1.727 m)   Wt 232 lb 9.6 oz (105.5 kg)   BMI 35.37 kg/m  Body mass index is 35.37 kg/m.  General:  alert and no distress  Abdomen: soft, bowel sounds active, non-tender  Incision:   healing well, no drainage, no erythema, no hernia, no seroma, no swelling, no dehiscence, incision well approximated. Small area of granulation tissue noted at umbilical incision.     Pathology:  1. Uterus, cervix and bilateral fallopian tubes,  :       - ENDOCERVIX WITH FOCAL SQUAMOUS METAPLASIA.      - WEAKLY PROLIFERATIVE ENDOMETRIUM WITH CHANGES CONSISTENT WITH RECENT ABLATION.      - MYOMETRIUM WITH LEIOMYOMATA, LARGEST MEASURING 5.3 CM.      - TWO FALLOPIAN TUBES WITH FIMBRIATED END AND PARATUBAL CYSTS.      - NEGATIVE FOR MALIGNANCY.  Assessment:   Patient s/p XI ROBOTIC ASSISTED TOTAL LAPAROSCOPIC HYSTERECTOMY AND BILATERAL SALPINGECTOMY (surgery)  Doing well postoperatively.   Plan:   1.  Continue any current medications as instructed by provider. 2. Wound care discussed.  Applied silver nitrate stick to umbilical incision 3. Operative findings again reviewed. Pathology report discussed. 4. Activity restrictions:  pelvic rest x 1 week 5. Anticipated return to work: now. 6. Follow up: Return to clinic for any scheduled appointments or for any gynecologic concerns as needed.      Hildred Laser, MD  [facility]

## 2023-06-28 ENCOUNTER — Encounter: Payer: Self-pay | Admitting: Obstetrics and Gynecology

## 2023-07-08 DIAGNOSIS — F331 Major depressive disorder, recurrent, moderate: Secondary | ICD-10-CM | POA: Diagnosis not present

## 2023-07-08 DIAGNOSIS — F411 Generalized anxiety disorder: Secondary | ICD-10-CM | POA: Diagnosis not present

## 2023-07-10 ENCOUNTER — Encounter: Payer: Self-pay | Admitting: Family Medicine

## 2023-07-10 DIAGNOSIS — M1610 Unilateral primary osteoarthritis, unspecified hip: Secondary | ICD-10-CM

## 2023-07-10 DIAGNOSIS — G8929 Other chronic pain: Secondary | ICD-10-CM

## 2023-07-10 NOTE — Telephone Encounter (Signed)
 Care team updated and letter sent for eye exam notes.

## 2023-07-19 IMAGING — RF DG UGI W/ HIGH DENSITY W/O KUB
13 of 15 series · 14 of 24 positions shown · IV contrast (agent unspecified)
Comparison: CT 09/09/2020

FLUOROSCOPY TIME:  Fluoroscopy Time:  3 minutes 48 seconds

CLINICAL DATA: 45-year-old female presents for a preoperative upper
GI series

EXAM:
WATER SOLUBLE UPPER GI SERIES
TECHNIQUE: Single-column upper GI series was performed using water soluble
contrast.
CONTRAST:  Barium

[Series 1: t abdomen supine · 0.14mm/px · 1 of 1 slices shown]
[im 1/1]
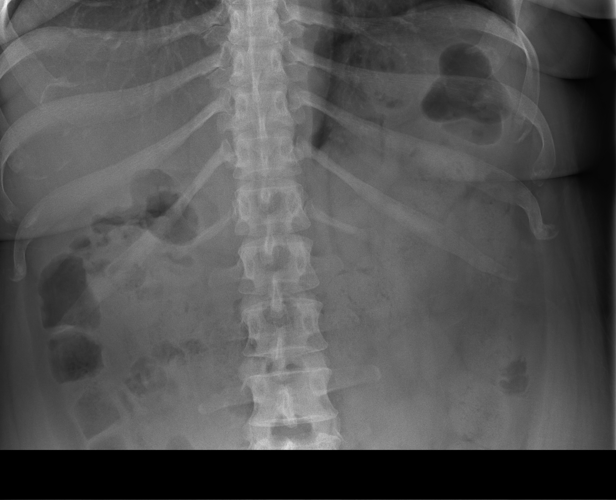

[Series 3: cp_standard · 0.25mm/px · 1 of 88 frames shown (1 of 9)]
[frame 75/88]
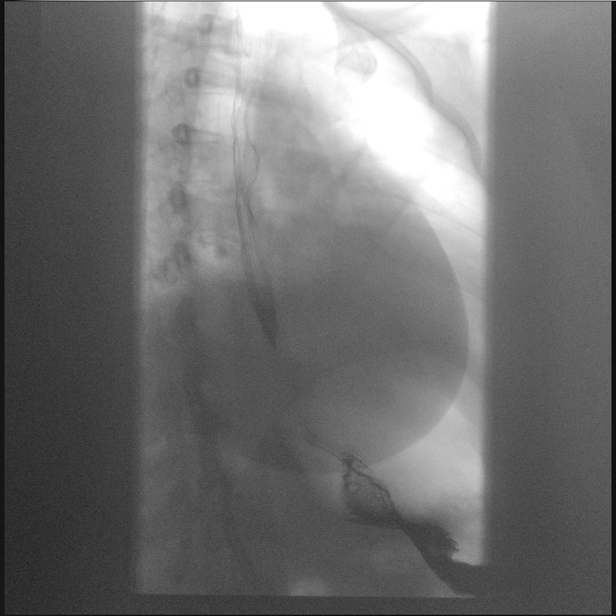

[Series 4: cp_standard · 0.25mm/px · 1 of 190 frames shown (2 of 9)]
[frame 96/190]
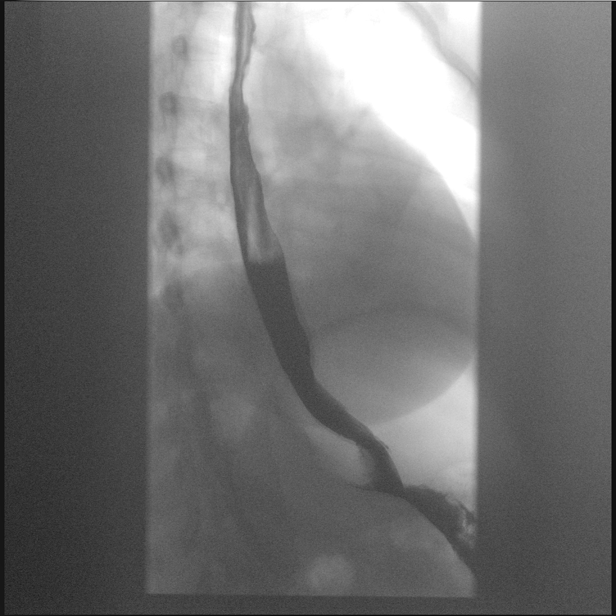

[Series 7: fluoro_barium 2fps_bw · 0.19mm/px · 1 of 1 slices shown (1 of 3)]
[im 1/1]
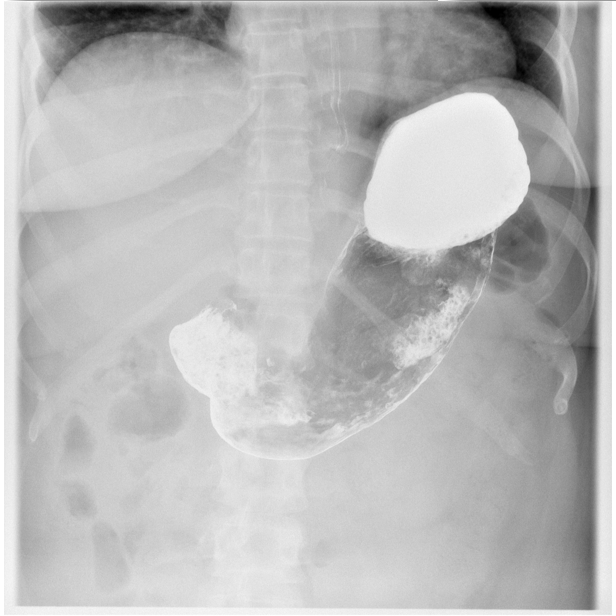

[Series 9: fluoro_barium 2fps_bw · 0.19mm/px · 1 of 1 slices shown (2 of 3)]
[im 1/1]
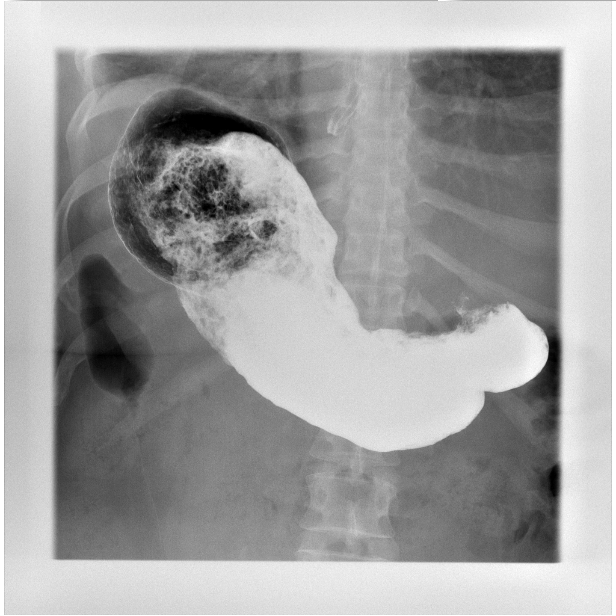

[Series 10: cp_standard · 0.28mm/px · 1 of 91 frames shown (3 of 9)]
[frame 78/91]
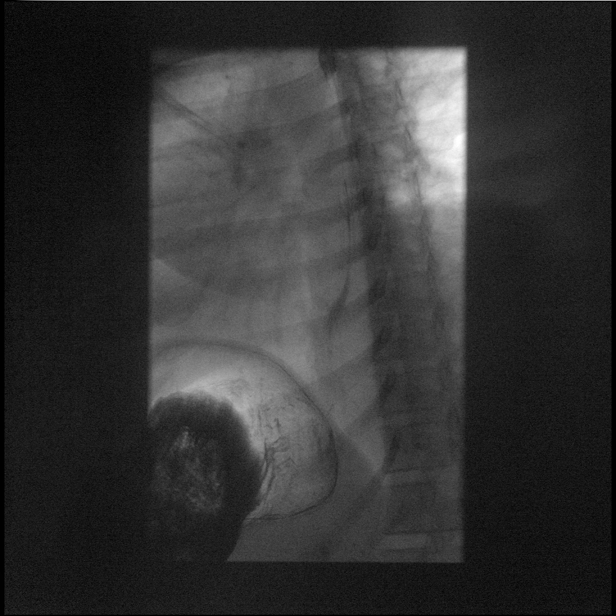

[Series 11: cp_standard · 0.28mm/px · 1 of 221 frames shown (4 of 9)]
[frame 188/221]
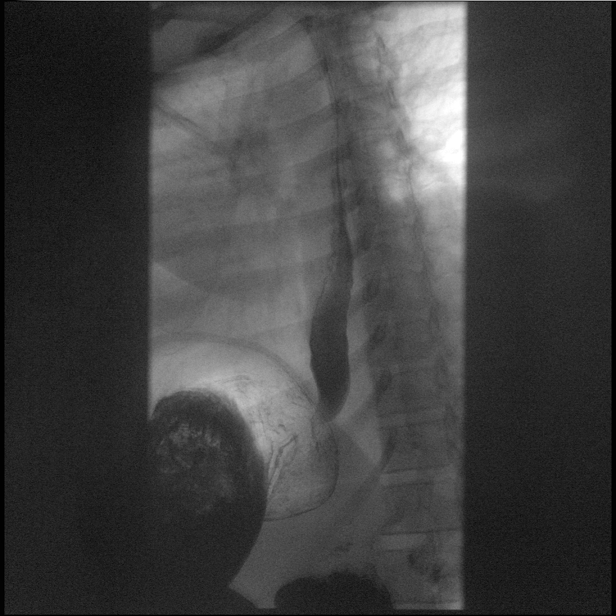

[Series 12: cp_standard · 0.27mm/px · 1 of 1 slices shown (5 of 9)]
[im 1/1]
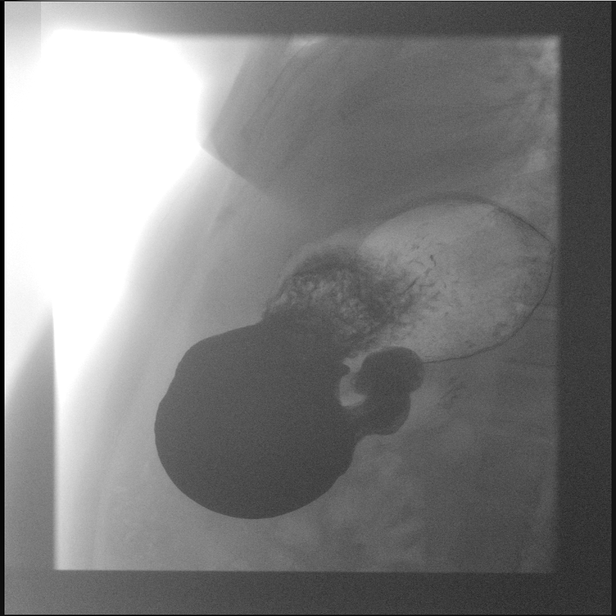

[Series 16: fluoro_barium 2fps_bw · 0.19mm/px · 1 of 1 slices shown (3 of 3)]
[im 1/1]
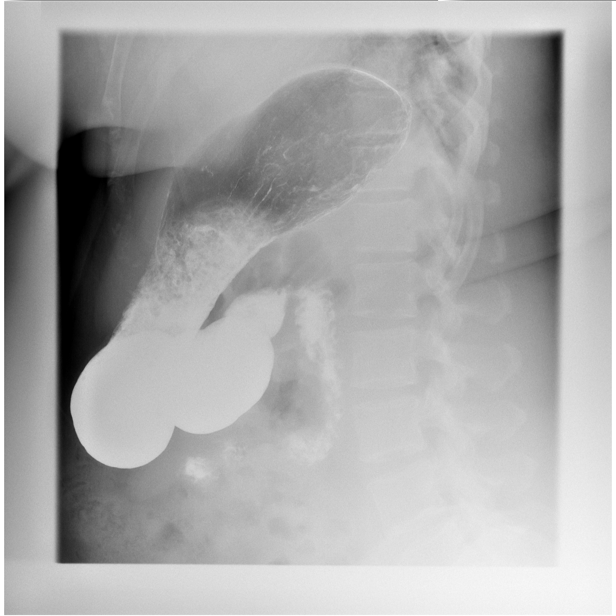

[Series 17: cp_standard · 0.28mm/px · 1 of 60 frames shown (6 of 9)]
[frame 36/60]
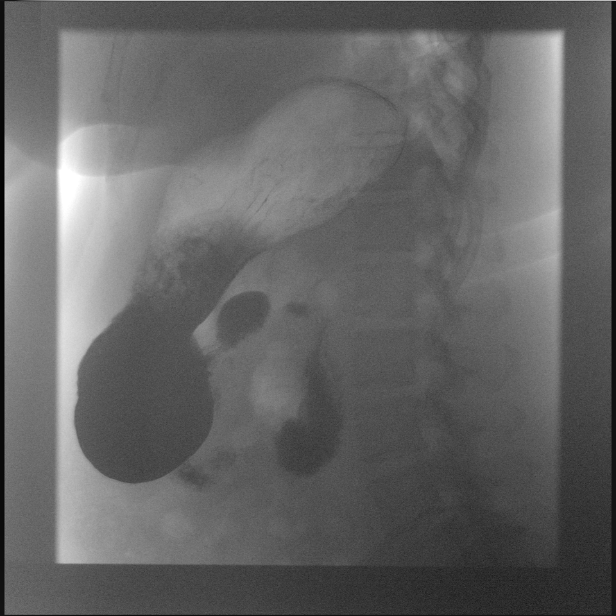

[Series 18: cp_standard · 0.17mm/px · 1 of 113 frames shown (7 of 9)]
[frame 57/113]
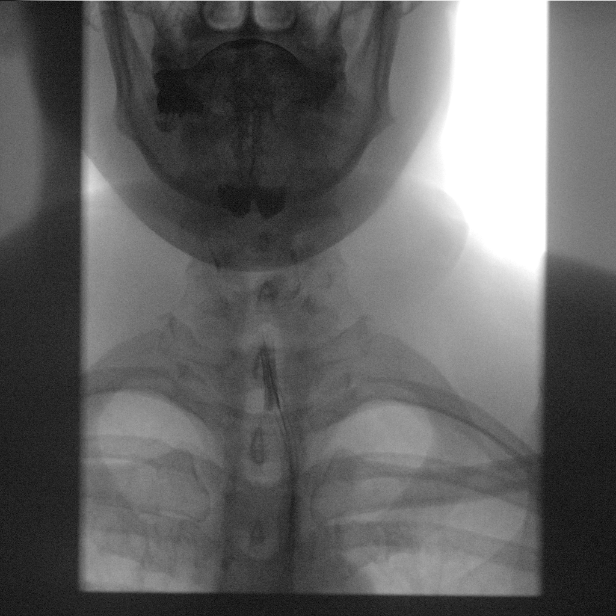

[Series 19: cp_standard · 0.17mm/px · 2 of 72 frames shown (8 of 9)]
[frame 11/72]
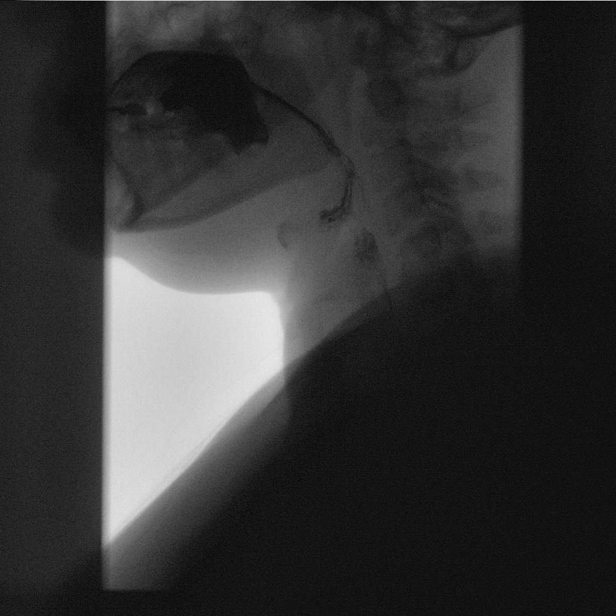
[frame 62/72]
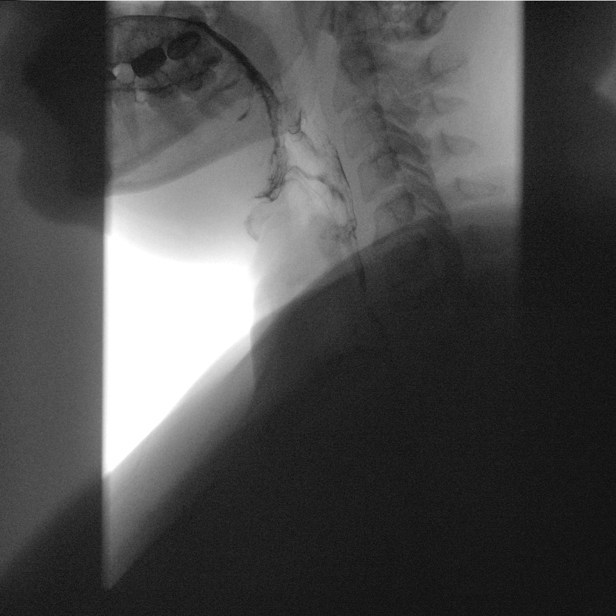

[Series 20: cp_standard · 0.25mm/px · 1 of 40 frames shown (9 of 9)]
[frame 35/40]
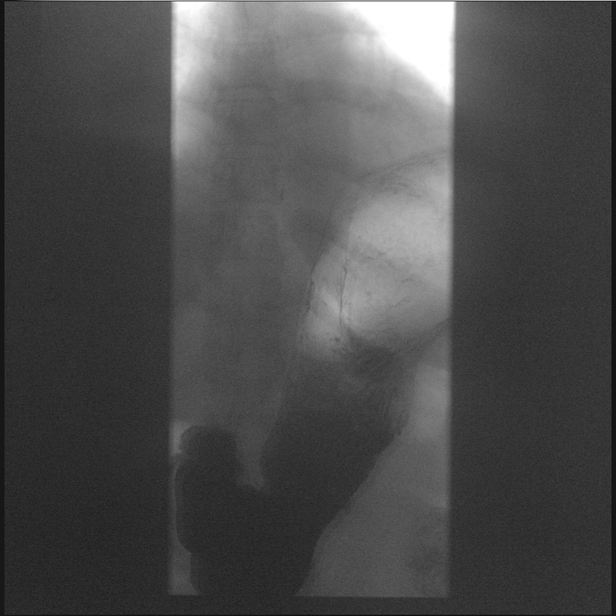

[14 of 24 positions shown; findings below may reference images not displayed]

FINDINGS: Spot radiographic images of the abdomen were performed before the
study. No significant gaseous distension within the stomach with no
radiopaque foreign body.

Rapid sequence imaging of the contrast bolus in the
pharynx/hypopharynx performed demonstrating no
penetration/aspiration.

Transient irregularity of the upper third of the thoracic esophagus,
above the impression of the aortic arch, resolving on late images
compatible with tertiary contractions. Otherwise unremarkable
appearance in the length of the thoracic esophagus.

Small hiatal hernia.

Normal anatomy/arrangement of stomach and the duodenum.

Retained food products within the stomach, with inability to acquire
a mucosal relief image. Slow emptying of stomach contents.

Barium tablet was observed to traversed the esophagus and the
gastroesophageal junction rapidly without delay.
IMPRESSION: Small hiatal hernia

Slow gastric motility/gastric emptying, potentially gastro paresis.

## 2023-07-19 IMAGING — CR DG CHEST 2V
1 series · 2 of 2 positions shown · non-contrast
Comparison: September 08, 2020.

CLINICAL DATA: Severe [place].  Preop

EXAM:
CHEST - 2 VIEW

[Series 1: dg chest 2 view · 0.14mm/px · 2 of 2 slices shown]
[im 1/2]
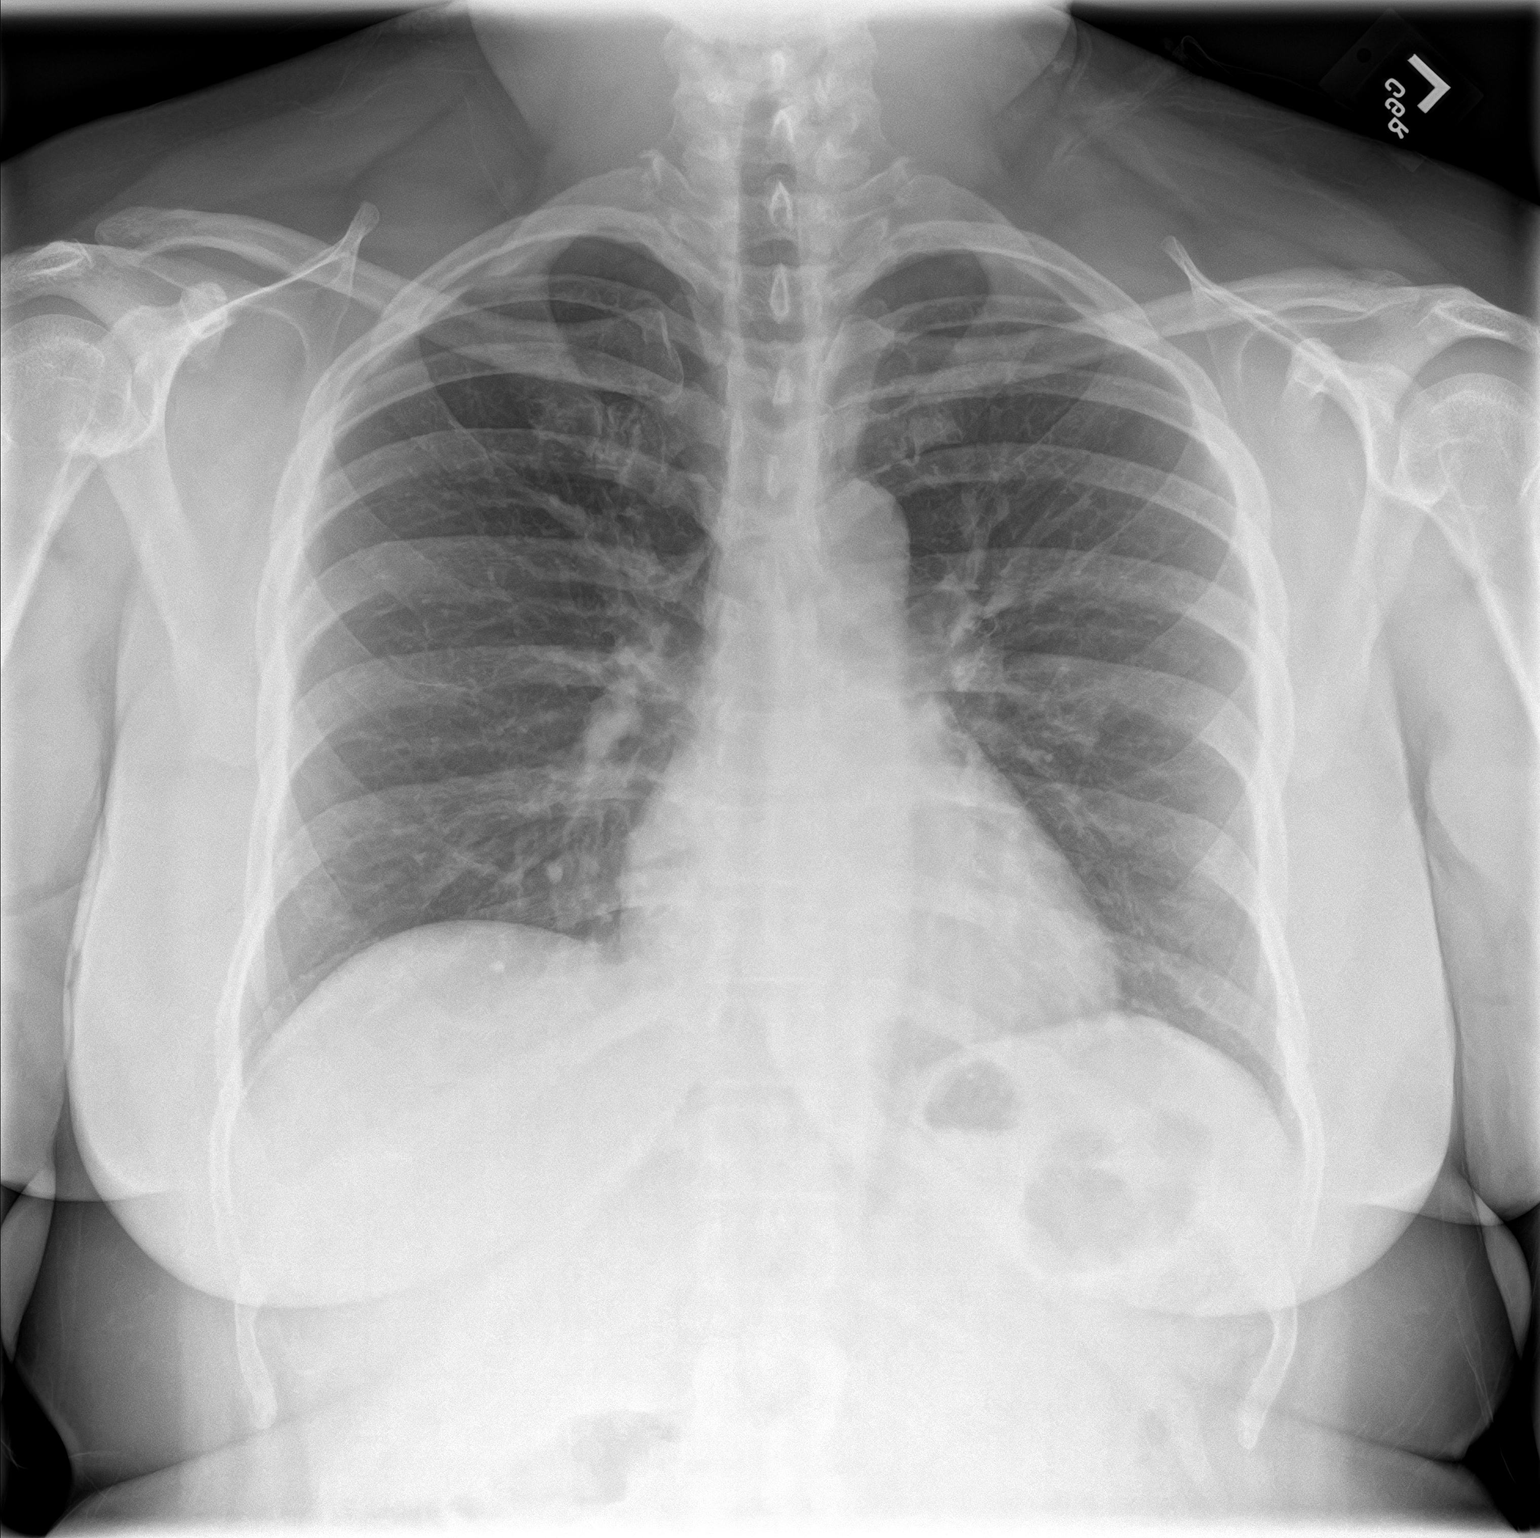
[im 2/2]
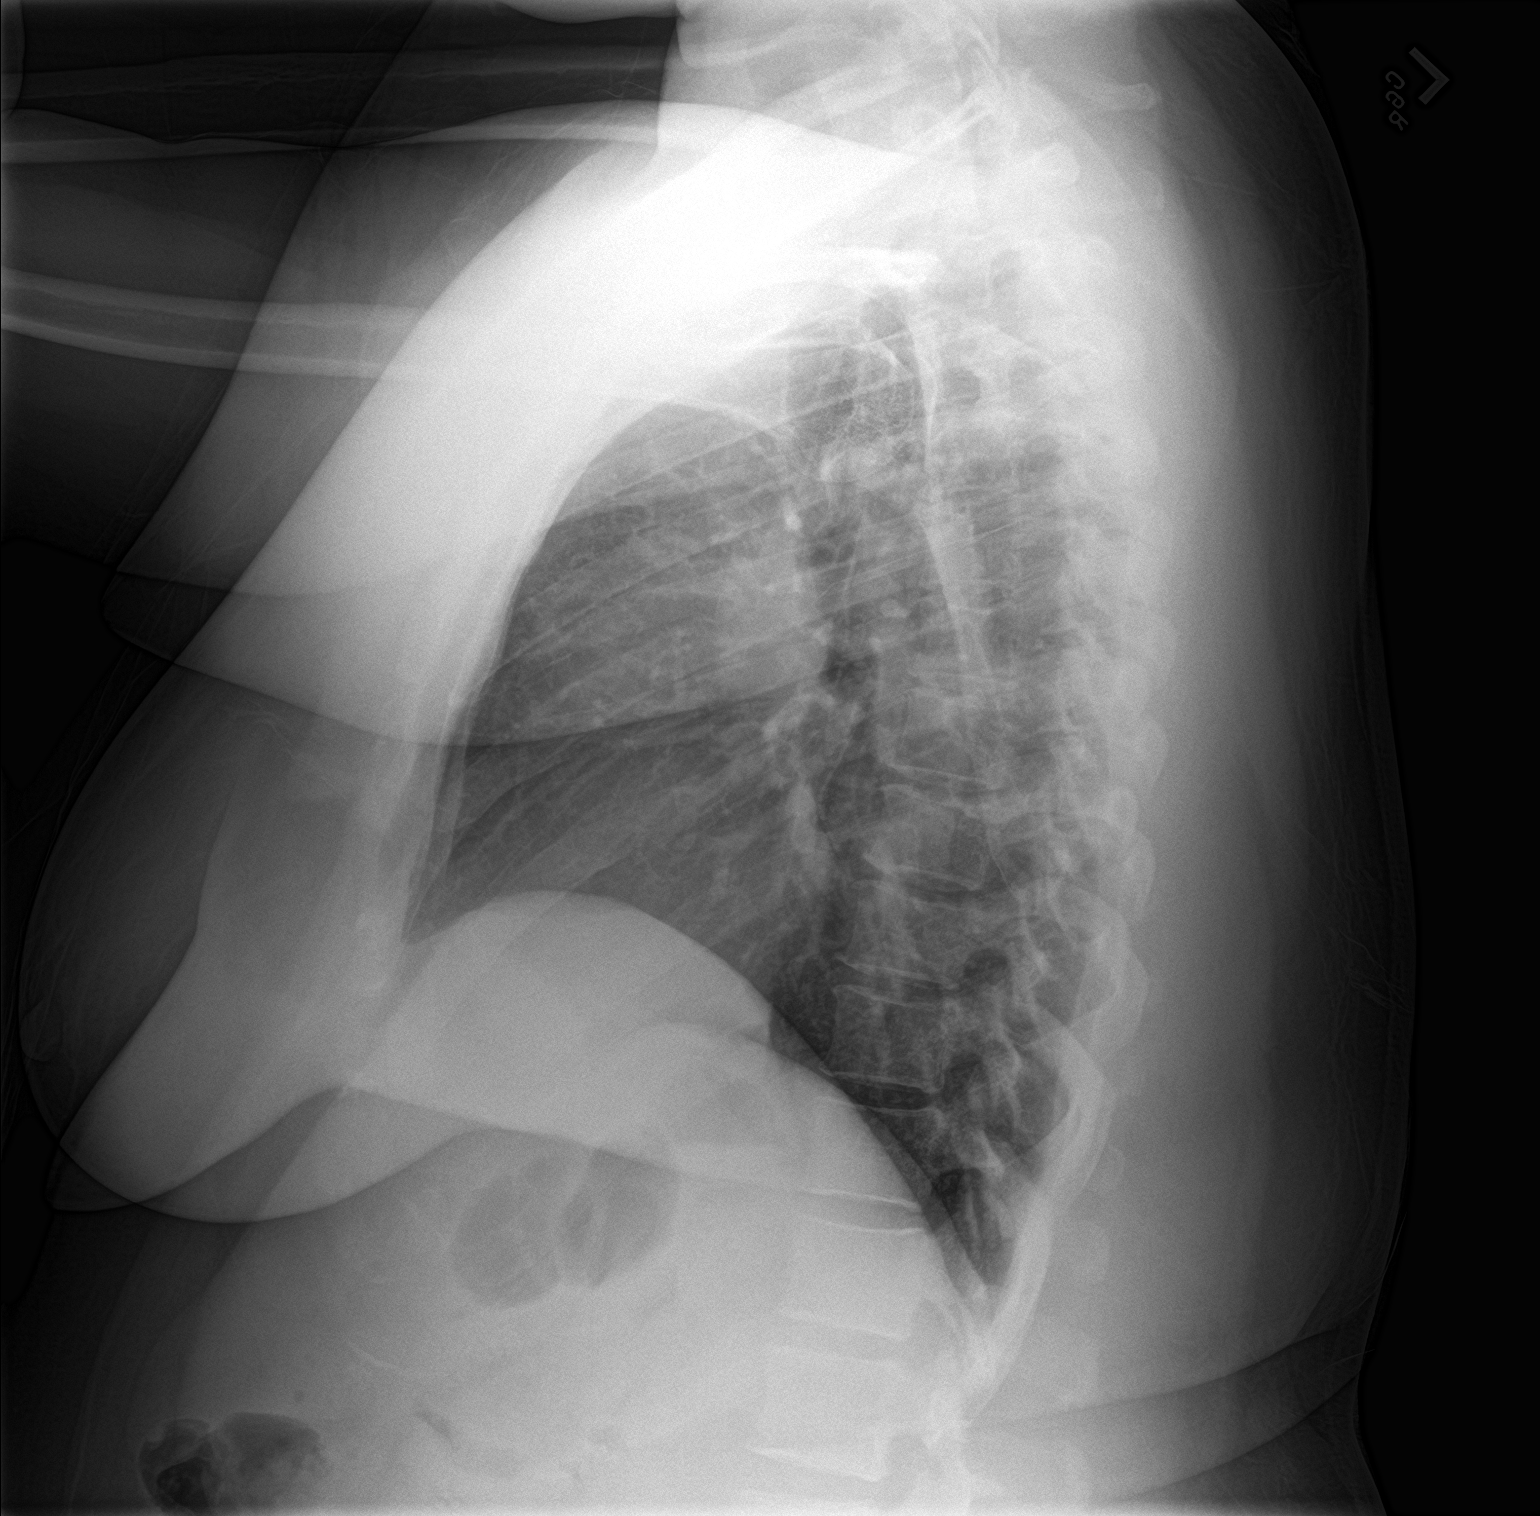

[2 of 2 positions shown; findings below may reference images not displayed]

FINDINGS: The heart size and mediastinal contours are within normal limits.
Both lungs are clear. The visualized skeletal structures are
unremarkable.
IMPRESSION: No active cardiopulmonary disease.

## 2023-07-25 ENCOUNTER — Ambulatory Visit
Admission: RE | Admit: 2023-07-25 | Discharge: 2023-07-25 | Disposition: A | Discharge status: Home / Self Care | Payer: BC Managed Care – PPO | Source: Ambulatory Visit | Attending: Family Medicine | Admitting: Family Medicine

## 2023-07-25 DIAGNOSIS — M1611 Unilateral primary osteoarthritis, right hip: Secondary | ICD-10-CM | POA: Diagnosis not present

## 2023-07-25 DIAGNOSIS — M1610 Unilateral primary osteoarthritis, unspecified hip: Secondary | ICD-10-CM

## 2023-07-25 DIAGNOSIS — G8929 Other chronic pain: Secondary | ICD-10-CM

## 2023-07-25 MED ORDER — IOPAMIDOL (ISOVUE-M 200) INJECTION 41%
13.0000 mL | Freq: Once | INTRAMUSCULAR | Status: AC
  Administered 2023-07-25: 13 mL

## 2023-07-25 MED ORDER — METHYLPREDNISOLONE ACETATE 40 MG/ML INJ SUSP (RADIOLOG
80.0000 mg | Freq: Once | INTRAMUSCULAR | Status: AC
  Administered 2023-07-25: 80 mg via INTRA_ARTICULAR

## 2023-07-25 NOTE — Procedures (Signed)
 Interventional Radiology Procedure Note  Procedure:   Image guided right hip therapeutic injection.   Complications: None  Recommendations:  - Ok to shower tomorrow - Do not submerge for 7 days    Signed,  Yvone Neu. Loreta Ave, DO

## 2023-08-05 DIAGNOSIS — F331 Major depressive disorder, recurrent, moderate: Secondary | ICD-10-CM | POA: Diagnosis not present

## 2023-08-05 DIAGNOSIS — F411 Generalized anxiety disorder: Secondary | ICD-10-CM | POA: Diagnosis not present

## 2023-08-19 ENCOUNTER — Other Ambulatory Visit: Payer: Self-pay | Admitting: Family Medicine

## 2023-08-19 DIAGNOSIS — F411 Generalized anxiety disorder: Secondary | ICD-10-CM | POA: Diagnosis not present

## 2023-08-19 DIAGNOSIS — F331 Major depressive disorder, recurrent, moderate: Secondary | ICD-10-CM | POA: Diagnosis not present

## 2023-08-21 ENCOUNTER — Other Ambulatory Visit (HOSPITAL_COMMUNITY): Payer: Self-pay

## 2023-08-21 ENCOUNTER — Telehealth: Payer: Self-pay

## 2023-08-21 NOTE — Telephone Encounter (Signed)
Pharmacy Patient Advocate Encounter   Received notification from CoverMyMeds that prior authorization for Ozempic (2 MG/DOSE) 8MG /3ML pen-injectors is required/requested.   Insurance verification completed.   The patient is insured through [employer] .   Per test claim: PA required; PA submitted to above mentioned insurance via CoverMyMeds Key/confirmation #/EOC [id number] Status is pending

## 2023-08-23 ENCOUNTER — Encounter: Payer: Self-pay | Admitting: *Deleted

## 2023-08-23 NOTE — Telephone Encounter (Signed)
Pharmacy Patient Advocate Encounter  Received notification from [facility] that Prior Authorization for Ozempic (2 MG/DOSE) 8MG /3ML pen-injectors has been APPROVED from 08-21-2023 to 08-20-2024   PA #/Case ID/Reference #: [id number]

## 2023-08-24 ENCOUNTER — Other Ambulatory Visit: Payer: Self-pay | Admitting: Family Medicine

## 2023-08-26 NOTE — Telephone Encounter (Signed)
Last office visit 04/22/2023 for GAD/MDD.  Last refilled 02/28/23 for #180 with 1 refill.  Next appt: CPE 10/21/2023.

## 2023-09-04 DIAGNOSIS — F331 Major depressive disorder, recurrent, moderate: Secondary | ICD-10-CM | POA: Diagnosis not present

## 2023-09-04 DIAGNOSIS — F411 Generalized anxiety disorder: Secondary | ICD-10-CM | POA: Diagnosis not present

## 2023-09-18 DIAGNOSIS — F331 Major depressive disorder, recurrent, moderate: Secondary | ICD-10-CM | POA: Diagnosis not present

## 2023-09-18 DIAGNOSIS — F411 Generalized anxiety disorder: Secondary | ICD-10-CM | POA: Diagnosis not present

## 2023-09-23 ENCOUNTER — Other Ambulatory Visit: Payer: Self-pay | Admitting: Family Medicine

## 2023-10-01 ENCOUNTER — Telehealth: Payer: Self-pay | Admitting: *Deleted

## 2023-10-01 DIAGNOSIS — R5383 Other fatigue: Secondary | ICD-10-CM

## 2023-10-01 DIAGNOSIS — E782 Mixed hyperlipidemia: Secondary | ICD-10-CM

## 2023-10-01 DIAGNOSIS — E1165 Type 2 diabetes mellitus with hyperglycemia: Secondary | ICD-10-CM

## 2023-10-01 NOTE — Telephone Encounter (Signed)
-----   Message from Alvina Chou sent at 09/30/2023  9:39 AM EDT ----- Regarding: Lab orders for Mon, 3.24.25 Patient is scheduled for CPX labs, please order future labs, Thanks , Camelia Eng

## 2023-10-02 DIAGNOSIS — F331 Major depressive disorder, recurrent, moderate: Secondary | ICD-10-CM | POA: Diagnosis not present

## 2023-10-02 DIAGNOSIS — F411 Generalized anxiety disorder: Secondary | ICD-10-CM | POA: Diagnosis not present

## 2023-10-14 ENCOUNTER — Other Ambulatory Visit: Payer: BC Managed Care – PPO

## 2023-10-16 DIAGNOSIS — F411 Generalized anxiety disorder: Secondary | ICD-10-CM | POA: Diagnosis not present

## 2023-10-16 DIAGNOSIS — F331 Major depressive disorder, recurrent, moderate: Secondary | ICD-10-CM | POA: Diagnosis not present

## 2023-10-20 ENCOUNTER — Other Ambulatory Visit: Payer: Self-pay | Admitting: Family Medicine

## 2023-10-21 ENCOUNTER — Encounter: Payer: BC Managed Care – PPO | Admitting: Family Medicine

## 2023-10-21 NOTE — Telephone Encounter (Signed)
Please call and schedule CPE with fasting labs prior with Dr. Copland.  

## 2023-10-21 NOTE — Telephone Encounter (Signed)
Patient will c/b and schedule

## 2023-10-22 NOTE — Telephone Encounter (Signed)
 Spoke to pt, scheduled cpe for 02/05/24

## 2023-10-30 DIAGNOSIS — F331 Major depressive disorder, recurrent, moderate: Secondary | ICD-10-CM | POA: Diagnosis not present

## 2023-10-30 DIAGNOSIS — F411 Generalized anxiety disorder: Secondary | ICD-10-CM | POA: Diagnosis not present

## 2023-11-13 DIAGNOSIS — F331 Major depressive disorder, recurrent, moderate: Secondary | ICD-10-CM | POA: Diagnosis not present

## 2023-11-14 ENCOUNTER — Other Ambulatory Visit: Payer: Self-pay | Admitting: Family Medicine

## 2023-12-11 DIAGNOSIS — F331 Major depressive disorder, recurrent, moderate: Secondary | ICD-10-CM | POA: Diagnosis not present

## 2023-12-11 DIAGNOSIS — F411 Generalized anxiety disorder: Secondary | ICD-10-CM | POA: Diagnosis not present

## 2023-12-25 DIAGNOSIS — F331 Major depressive disorder, recurrent, moderate: Secondary | ICD-10-CM | POA: Diagnosis not present

## 2023-12-25 DIAGNOSIS — F411 Generalized anxiety disorder: Secondary | ICD-10-CM | POA: Diagnosis not present

## 2023-12-30 ENCOUNTER — Ambulatory Visit (INDEPENDENT_AMBULATORY_CARE_PROVIDER_SITE_OTHER): Admitting: Obstetrics & Gynecology

## 2023-12-30 VITALS — BP 131/89 | HR 101 | Ht 68.0 in | Wt 235.2 lb

## 2023-12-30 DIAGNOSIS — R1031 Right lower quadrant pain: Secondary | ICD-10-CM | POA: Diagnosis not present

## 2023-12-30 DIAGNOSIS — R5383 Other fatigue: Secondary | ICD-10-CM | POA: Diagnosis not present

## 2023-12-30 DIAGNOSIS — F331 Major depressive disorder, recurrent, moderate: Secondary | ICD-10-CM

## 2023-12-30 MED ORDER — SERTRALINE HCL 100 MG PO TABS: ORAL_TABLET | ORAL | 4 refills | Status: AC

## 2023-12-30 NOTE — Progress Notes (Signed)
    GYNECOLOGY PROGRESS NOTE  Subjective:    Patient ID: Priscilla King, female    DOB: 1975-09-01, 48 y.o.   MRN: [id number]  HPI  Patient is a 48 y.o. married G3P2012 (5 and 41 yo kids) here for 5 month h/o RLQ pain. She rates this anywhere from a 4 to 8 out of 10. It happens almost daily, about 25 days in a row per month, followed by about 5 days of low pain. She had a robotic hysterectomy and bilateral salpingectgomy 05/2023 for DUB and fibroids. She and Dr. Denman Fischer discussed possibly removing her ovaries but at that time she opted to keep them.  She is on the max dose of celebrex  but will still add IBU 400 mg when the pain is terrible.  She is sexually active. The pain is not related to penetration. During most of the month she feel premenstrual symtoms including bloating, irritability, fatigue.  The following portions of the patient's history were reviewed and updated as appropriate: allergies, current medications, past family history, past medical history, past social history, past surgical history, and problem list.  Review of Systems Pertinent items are noted in HPI.  She is originally from [place]. She has lost about 25 pounds on Ozembic in the last 7 months. 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  Objective:   Blood pressure 131/89, pulse (!) 101, height 5\' 8"  (1.727 m), weight 235 lb 3.2 oz (106.7 kg), last menstrual period 04/13/2023. Body mass index is 35.76 kg/m. Well nourished, well hydrated Black female, no apparent distress She is ambulating and conversing normally.    Assessment:  PMS symptoms RLQ pain  Plan:  Pelvic ultrasound I don't think that a pelvic exam would be helpful today. We discussed that SSRIs are used to treat PMDD and may be helpful with her symptoms. She is already taking zoloft  100 mg and I have increased that to 200 mg per day. Check TSH/free T4 for evaluation of fatigue.

## 2023-12-31 LAB — TSH+FREE T4
Free T4: 1.11 ng/dL (ref 0.82–1.77)
TSH: 1.47 u[IU]/mL (ref 0.450–4.500)

## 2024-01-07 ENCOUNTER — Ambulatory Visit (INDEPENDENT_AMBULATORY_CARE_PROVIDER_SITE_OTHER)

## 2024-01-07 DIAGNOSIS — R1031 Right lower quadrant pain: Secondary | ICD-10-CM | POA: Diagnosis not present

## 2024-01-08 DIAGNOSIS — F331 Major depressive disorder, recurrent, moderate: Secondary | ICD-10-CM | POA: Diagnosis not present

## 2024-01-08 DIAGNOSIS — F411 Generalized anxiety disorder: Secondary | ICD-10-CM | POA: Diagnosis not present

## 2024-01-29 ENCOUNTER — Other Ambulatory Visit (INDEPENDENT_AMBULATORY_CARE_PROVIDER_SITE_OTHER)

## 2024-01-29 DIAGNOSIS — E1165 Type 2 diabetes mellitus with hyperglycemia: Secondary | ICD-10-CM | POA: Diagnosis not present

## 2024-01-29 DIAGNOSIS — E782 Mixed hyperlipidemia: Secondary | ICD-10-CM | POA: Diagnosis not present

## 2024-01-29 DIAGNOSIS — R5383 Other fatigue: Secondary | ICD-10-CM

## 2024-01-29 DIAGNOSIS — F331 Major depressive disorder, recurrent, moderate: Secondary | ICD-10-CM | POA: Diagnosis not present

## 2024-01-29 DIAGNOSIS — F411 Generalized anxiety disorder: Secondary | ICD-10-CM | POA: Diagnosis not present

## 2024-01-29 LAB — BASIC METABOLIC PANEL WITH GFR
BUN: 11 mg/dL (ref 6–23)
CO2: 28 meq/L (ref 19–32)
Calcium: 9.3 mg/dL (ref 8.4–10.5)
Chloride: 103 meq/L (ref 96–112)
Creatinine, Ser: 0.7 mg/dL (ref 0.40–1.20)
GFR: 102.6 mL/min (ref >60.00)
Glucose, Bld: 135 mg/dL — ABNORMAL HIGH (ref 70–99)
Potassium: 4.5 meq/L (ref 3.5–5.1)
Sodium: 137 meq/L (ref 135–145)

## 2024-01-29 LAB — LIPID PANEL
Cholesterol: 150 mg/dL (ref 0–200)
HDL: 59 mg/dL (ref >39.00)
LDL Cholesterol: 79 mg/dL (ref 0–99)
NonHDL: 91.15
Total CHOL/HDL Ratio: 3
Triglycerides: 59 mg/dL (ref 0.0–149.0)
VLDL: 11.8 mg/dL (ref 0.0–40.0)

## 2024-01-29 LAB — HEPATIC FUNCTION PANEL
ALT: 29 U/L (ref 0–35)
AST: 26 U/L (ref 0–37)
Albumin: 4.4 g/dL (ref 3.5–5.2)
Alkaline Phosphatase: 83 U/L (ref 39–117)
Bilirubin, Direct: 0.1 mg/dL (ref 0.0–0.3)
Total Bilirubin: 0.4 mg/dL (ref 0.2–1.2)
Total Protein: 7.3 g/dL (ref 6.0–8.3)

## 2024-01-29 LAB — CBC WITH DIFFERENTIAL/PLATELET
Basophils Absolute: 0 K/uL (ref 0.0–0.1)
Basophils Relative: 0.7 % (ref 0.0–3.0)
Eosinophils Absolute: 0.2 K/uL (ref 0.0–0.7)
Eosinophils Relative: 3.5 % (ref 0.0–5.0)
HCT: 41.4 % (ref 36.0–46.0)
Hemoglobin: 13.8 g/dL (ref 12.0–15.0)
Lymphocytes Relative: 31.9 % (ref 12.0–46.0)
Lymphs Abs: 1.4 K/uL (ref 0.7–4.0)
MCHC: 33.4 g/dL (ref 30.0–36.0)
MCV: 87.2 fl (ref 78.0–100.0)
Monocytes Absolute: 0.3 K/uL (ref 0.1–1.0)
Monocytes Relative: 6.3 % (ref 3.0–12.0)
Neutro Abs: 2.6 K/uL (ref 1.4–7.7)
Neutrophils Relative %: 57.6 % (ref 43.0–77.0)
Platelets: 254 K/uL (ref 150.0–400.0)
RBC: 4.75 Mil/uL (ref 3.87–5.11)
RDW: 12.6 % (ref 11.5–15.5)
WBC: 4.4 K/uL (ref 4.0–10.5)

## 2024-01-29 LAB — MICROALBUMIN / CREATININE URINE RATIO
Creatinine,U: 316.1 mg/dL
Microalb Creat Ratio: 8.1 mg/g (ref 0.0–30.0)
Microalb, Ur: 2.6 mg/dL — ABNORMAL HIGH (ref 0.0–1.9)

## 2024-01-29 LAB — HEMOGLOBIN A1C: Hgb A1c MFr Bld: 7 % — ABNORMAL HIGH (ref 4.6–6.5)

## 2024-02-05 ENCOUNTER — Encounter: Payer: Self-pay | Admitting: Family Medicine

## 2024-02-05 ENCOUNTER — Encounter: Admitting: Family Medicine

## 2024-02-05 NOTE — Telephone Encounter (Signed)
 Mychart sent.

## 2024-02-19 ENCOUNTER — Other Ambulatory Visit: Payer: Self-pay | Admitting: Family Medicine

## 2024-02-19 NOTE — Telephone Encounter (Signed)
 Last office visit 04/22/2023 for GAD.  Last refilled 08/26/2023 for #180 with 1 refill.  Next appt: CPE 02/26/2024.

## 2024-02-20 ENCOUNTER — Other Ambulatory Visit: Payer: Self-pay | Admitting: Family Medicine

## 2024-02-20 DIAGNOSIS — F331 Major depressive disorder, recurrent, moderate: Secondary | ICD-10-CM

## 2024-02-23 NOTE — Progress Notes (Unsigned)
 Priscilla King T. Priscilla Giglia, MD, CAQ Sports Medicine [facility] at [facility] [address] [place], [address]  Phone: [phone]  FAX: [phone]  Priscilla King - 48 y.o. female  MRN [id number]  Date of Birth: 01/21/1976  Date: 02/26/2024  PCP: Watt Mirza, MD  Referral: Watt Mirza, MD  No chief complaint on file.  Patient Care Team: Watt Mirza, MD as PCP - General Darliss Rogue, MD as PCP - Cardiology (Cardiology) Pa, [facility] (Ophthalmology) Subjective:   Priscilla King is a 48 y.o. pleasant patient who presents with the following:  Health Maintenance Summary Reviewed and updated, unless pt declines services.  Tobacco History Reviewed. Non-smoker Alcohol: No concerns, no excessive use Exercise Habits: Some activity, rec at least 30 mins 5 times a week STD concerns: none Drug Use: None Lumps or breast concerns: no  Foot exam Flu booster Mammogram due September 2025  She has known well, has had a history of diabetes.  She has struggled with joint pains significantly.  Diabetes Mellitus: Tolerating Medications: yes Compliance with diet: fair, There is no height or weight on file to calculate BMI. Exercise: minimal / intermittent Avg blood sugars at home: not checking Foot problems: none Hypoglycemia: none No nausea, vomitting, blurred vision, polyuria.  Lab Results  Component Value Date   HGBA1C 7.0 (H) 01/29/2024   HGBA1C 5.9 (H) 05/15/2023   HGBA1C 5.8 (A) 08/27/2022   Lab Results  Component Value Date   MICROALBUR 2.6 (H) 01/29/2024   LDLCALC 79 01/29/2024   CREATININE 0.70 01/29/2024    Wt Readings from Last 3 Encounters:  12/30/23 235 lb 3.2 oz (106.7 kg)  06/27/23 232 lb 9.6 oz (105.5 kg)  05/29/23 232 lb 11.2 oz (105.6 kg)     Health Maintenance  Topic Date Due   Hepatitis B Vaccines (1 of 3 - 19+ 3-dose series) Never done   COVID-19 Vaccine (4 - 2024-25 season) 03/24/2023    FOOT EXAM  07/31/2023   INFLUENZA VACCINE  02/21/2024   OPHTHALMOLOGY EXAM  03/13/2024   MAMMOGRAM  04/14/2024   HEMOGLOBIN A1C  07/31/2024   Diabetic kidney evaluation - eGFR measurement  01/28/2025   Diabetic kidney evaluation - Urine ACR  01/28/2025   Pneumococcal Vaccine: 19-49 Years (3 of 3 - PCV20 or PCV21) 02/12/2026   DTaP/Tdap/Td (3 - Td or Tdap) 01/18/2027   Cervical Cancer Screening (HPV/Pap Cotest)  12/11/2027   Colonoscopy  11/01/2028   Hepatitis C Screening  Completed   HIV Screening  Completed   HPV VACCINES  Aged Out   Meningococcal B Vaccine  Aged Out    Immunization History  Administered Date(s) Administered   Influenza Split 06/05/2012   Influenza,inj,Quad PF,6+ Mos 04/26/2017   Influenza-Unspecified 05/10/2020   Moderna Sars-Covid-2 Vaccination 10/02/2019, 10/30/2019   PFIZER(Purple Top)SARS-COV-2 Vaccination 07/28/2020   Pneumococcal Conjugate-13 07/19/2014   Pneumococcal Polysaccharide-23 10/14/2017   Tdap 06/05/2012, 01/17/2017   Patient Active Problem List   Diagnosis Date Noted   Uncontrolled type 2 diabetes mellitus with hyperglycemia, without long-term current use of insulin (HCC) 06/22/2014    Priority: High   Morbid obesity with BMI of 40.0-44.9, adult (HCC) 08/14/2021    Priority: Medium    Major depression, recurrent (HCC) 09/16/2019    Priority: Medium    Hyperlipidemia LDL goal <70     Priority: Medium    Osteoarthritis, multiple sites 06/22/2021    Priority: Low   Kienboeck disease of adults  Priority: Low   Femoroacetabular impingement of right hip 03/29/2021   PCOS (polycystic ovarian syndrome) 04/15/2012   OTHER SPECIFIED FORMS OF HEARING LOSS 03/10/2009    Past Medical History:  Diagnosis Date   Anemia    Arthritis    Depression    Diabetes mellitus type 2, uncontrolled, without complications 06/2014   Femoroacetabular impingement of right hip    GERD (gastroesophageal reflux disease)    History of polycystic ovaries     Hyperlipidemia LDL goal <70 06/2014   Kienbock's avascular necrosis of lunate, adult    Menorrhagia with regular cycle    Morbid obesity with BMI of 40.0-44.9, adult (HCC) 08/14/2021   Osteoarthritis, multiple sites    PCOS (polycystic ovarian syndrome)     Past Surgical History:  Procedure Laterality Date   CESAREAN SECTION N/A 08/31/2012   Procedure: CESAREAN SECTION;  Surgeon: Winton Felt, MD;  Location: [facility];  Service: Obstetrics;  Laterality: N/A;   CESAREAN SECTION N/A 03/20/2017   Procedure: REPEAT CESAREAN SECTION;  Surgeon: Connell Davies, MD;  Location: [facility] ORS;  Service: Obstetrics;  Laterality: N/A;  Female born @ 1500 Apgars: 8/9 Weight:10lbs 9ozs   COLONOSCOPY WITH PROPOFOL  N/A 11/01/2021   Procedure: COLONOSCOPY WITH PROPOFOL ;  Surgeon: Therisa Bi, MD;  Location: [facility];  Service: Gastroenterology;  Laterality: N/A;   DILATATION & CURETTAGE/HYSTEROSCOPY WITH MYOSURE N/A 01/28/2023   Procedure: DILATATION & CURETTAGE/HYSTEROSCOPY WITH MYOSURE;  Surgeon: Connell Davies, MD;  Location: [facility] ORS;  Service: Gynecology;  Laterality: N/A;   DILATION AND CURETTAGE OF UTERUS     DILITATION & CURRETTAGE/HYSTROSCOPY WITH NOVASURE ABLATION N/A 01/28/2023   Procedure: HYSTEROSCOPY D&C WITH ENDOMETRIAL ABLATION;  Surgeon: Connell Davies, MD;  Location: [facility] ORS;  Service: Gynecology;  Laterality: N/A;   ROBOTIC ASSISTED LAPAROSCOPIC HYSTERECTOMY AND SALPINGECTOMY N/A 05/20/2023   Procedure: XI ROBOTIC ASSISTED TOTAL LAPAROSCOPIC HYSTERECTOMY AND BILATERAL SALPINGECTOMY;  Surgeon: Connell Davies, MD;  Location: [facility] ORS;  Service: Gynecology;  Laterality: N/A;    Family History  Problem Relation Age of Onset   Stroke Mother        CVA (TIA)   Diabetes Mother    Healthy Sister    Healthy Daughter    Diabetes Maternal Grandmother    Hypertension Maternal Grandmother    Diabetes Paternal Grandmother    Hypertension Paternal Grandmother    Healthy Brother    Healthy  Brother    Healthy Son    Breast cancer Neg Hx     Social History   Social History Narrative   Married, Ritson   [place]   No exercise- knee   Professor of Math at [employer]    Past Medical History, Surgical History, Social History, Family History, Problem List, Medications, and Allergies have been reviewed and updated if relevant.  Review of Systems: Pertinent positives are listed above.  Otherwise, a full 14 point review of systems has been done in full and it is negative except where it is noted positive.  Objective:   LMP 04/13/2023  Ideal Body Weight:   No results found.    04/22/2023    9:48 AM 03/11/2023   10:31 AM 08/27/2022    8:24 AM 02/15/2022    9:28 AM 07/31/2021    3:25 PM  Depression screen PHQ 2/9  Decreased Interest 1 1 1 1  0  Down, Depressed, Hopeless 1 1 1 1  0  PHQ - 2 Score 2 2 2 2  0  Altered sleeping 3 3 2 3    Tired, decreased  energy 2 2 1 2    Change in appetite 2 3 0 1   Feeling bad or failure about yourself  0 0 0 0   Trouble concentrating 2 0 0 1   Moving slowly or fidgety/restless 0 1 0 0   Suicidal thoughts 0 0 0 0   PHQ-9 Score 11 11 5 9    Difficult doing work/chores Somewhat difficult Somewhat difficult Not difficult at all Somewhat difficult      GEN: well developed, well nourished, no acute distress Eyes: conjunctiva and lids normal, PERRLA, EOMI ENT: TM clear, nares clear, oral exam WNL Neck: supple, no lymphadenopathy, no thyromegaly, no JVD Pulm: clear to auscultation and percussion, respiratory effort normal CV: regular rate and rhythm, S1-S2, no murmur, rub or gallop, no bruits Chest: no scars, masses, no lumps BREAST: breast exam declined GI: soft, non-tender; no hepatosplenomegaly, masses; active bowel sounds all quadrants GU: GU exam declined Lymph: no cervical, axillary or inguinal adenopathy MSK: gait normal, muscle tone and strength WNL, no joint swelling, effusions, discoloration, crepitus  SKIN: clear, good turgor, color WNL,  no rashes, lesions, or ulcerations Neuro: normal mental status, normal strength, sensation, and motion Psych: alert; oriented to person, place and time, normally interactive and not anxious or depressed in appearance.   All labs reviewed with patient. Results for orders placed or performed in visit on 01/29/24  Hepatic Function Panel   Collection Time: 01/29/24  8:40 AM  Result Value Ref Range   Total Bilirubin 0.4 0.2 - 1.2 mg/dL   Bilirubin, Direct 0.1 0.0 - 0.3 mg/dL   Alkaline Phosphatase 83 39 - 117 U/L   AST 26 0 - 37 U/L   ALT 29 0 - 35 U/L   Total Protein 7.3 6.0 - 8.3 g/dL   Albumin 4.4 3.5 - 5.2 g/dL  Basic metabolic panel   Collection Time: 01/29/24  8:40 AM  Result Value Ref Range   Sodium 137 135 - 145 mEq/L   Potassium 4.5 3.5 - 5.1 mEq/L   Chloride 103 96 - 112 mEq/L   CO2 28 19 - 32 mEq/L   Glucose, Bld 135 (H) 70 - 99 mg/dL   BUN 11 6 - 23 mg/dL   Creatinine, Ser 9.29 0.40 - 1.20 mg/dL   GFR 897.39 >39.99 mL/min   Calcium  9.3 8.4 - 10.5 mg/dL  CBC with Differential/Platelet   Collection Time: 01/29/24  8:40 AM  Result Value Ref Range   WBC 4.4 4.0 - 10.5 K/uL   RBC 4.75 3.87 - 5.11 Mil/uL   Hemoglobin 13.8 12.0 - 15.0 g/dL   HCT 58.5 63.9 - 53.9 %   MCV 87.2 78.0 - 100.0 fl   MCHC 33.4 30.0 - 36.0 g/dL   RDW 87.3 88.4 - 84.4 %   Platelets 254.0 150.0 - 400.0 K/uL   Neutrophils Relative % 57.6 43.0 - 77.0 %   Lymphocytes Relative 31.9 12.0 - 46.0 %   Monocytes Relative 6.3 3.0 - 12.0 %   Eosinophils Relative 3.5 0.0 - 5.0 %   Basophils Relative 0.7 0.0 - 3.0 %   Neutro Abs 2.6 1.4 - 7.7 K/uL   Lymphs Abs 1.4 0.7 - 4.0 K/uL   Monocytes Absolute 0.3 0.1 - 1.0 K/uL   Eosinophils Absolute 0.2 0.0 - 0.7 K/uL   Basophils Absolute 0.0 0.0 - 0.1 K/uL  Microalbumin / creatinine urine ratio   Collection Time: 01/29/24  8:40 AM  Result Value Ref Range   Microalb, Ur 2.6 (H)  0.0 - 1.9 mg/dL   Creatinine,U 683.8 mg/dL   Microalb Creat Ratio 8.1 0.0 - 30.0  mg/g  Lipid panel   Collection Time: 01/29/24  8:40 AM  Result Value Ref Range   Cholesterol 150 0 - 200 mg/dL   Triglycerides 40.9 0.0 - 149.0 mg/dL   HDL 40.99 >60.99 mg/dL   VLDL 88.1 0.0 - 59.9 mg/dL   LDL Cholesterol 79 0 - 99 mg/dL   Total CHOL/HDL Ratio 3    NonHDL 91.15   Hemoglobin A1c   Collection Time: 01/29/24  8:40 AM  Result Value Ref Range   Hgb A1c MFr Bld 7.0 (H) 4.6 - 6.5 %   No results found.  Assessment and Plan:     ICD-10-CM   1. Healthcare maintenance  Z00.00       Health Maintenance Exam: The patient's preventative maintenance and recommended screening tests for an annual wellness exam were reviewed in full today. Brought up to date unless services declined.  Counselled on the importance of diet, exercise, and its role in overall health and mortality. The patient's FH and SH was reviewed, including their home life, tobacco status, and drug and alcohol status.  Follow-up in 1 year for physical exam or additional follow-up below.  Disposition: No follow-ups on file.  Future Appointments  Date Time Provider Department Center  02/26/2024  9:40 AM Korra Christine, Jacques, MD [facility]    No orders of the defined types were placed in this encounter.  There are no discontinued medications. No orders of the defined types were placed in this encounter.   Signed,  Jacques DASEN. Raiford Fetterman, MD   Allergies as of 02/26/2024       Reactions   Vicodin [hydrocodone -acetaminophen ] Swelling   Tongue swelling and headache        Medication List        Accurate as of February 23, 2024 12:56 PM. If you have any questions, ask your nurse or doctor.          amitriptyline  25 MG tablet Commonly known as: ELAVIL  Take 1 tablet (25 mg total) by mouth at bedtime.   busPIRone  10 MG tablet Commonly known as: BUSPAR  TAKE 1 TABLET BY MOUTH THREE TIMES A DAY   celecoxib  200 MG capsule Commonly known as: CELEBREX  TAKE 1 CAPSULE BY MOUTH TWICE A DAY   ibuprofen   600 MG tablet Commonly known as: ADVIL  Take 1 tablet (600 mg total) by mouth every 6 (six) hours as needed.   IRON PO Take 2 tablets by mouth daily at 6 (six) AM.   MULTIVITAMIN ADULT PO Take 1 tablet by mouth daily at 6 (six) AM.   oxyCODONE -acetaminophen  5-325 MG tablet Commonly known as: Percocet Take 1-2 tablets by mouth every 6 (six) hours as needed for severe pain (pain score 7-10).   Ozempic  (2 MG/DOSE) 8 MG/3ML Sopn Generic drug: Semaglutide  (2 MG/DOSE) INJECT 2 MG AS DIRECTED ONCE A WEEK.   rosuvastatin  20 MG tablet Commonly known as: CRESTOR  TAKE 1 TABLET BY MOUTH EVERY DAY   sertraline  100 MG tablet Commonly known as: ZOLOFT  Take 2 pills per day   Voltaren  1 % Gel Generic drug: diclofenac  Sodium Apply 2 g topically 4 (four) times daily as needed.

## 2024-02-26 ENCOUNTER — Other Ambulatory Visit: Payer: Self-pay | Admitting: Family Medicine

## 2024-02-26 ENCOUNTER — Ambulatory Visit (INDEPENDENT_AMBULATORY_CARE_PROVIDER_SITE_OTHER): Admitting: Family Medicine

## 2024-02-26 VITALS — BP 120/84 | HR 95 | Temp 97.1°F | Ht 65.75 in | Wt 236.2 lb

## 2024-02-26 DIAGNOSIS — F410 Panic disorder [episodic paroxysmal anxiety] without agoraphobia: Secondary | ICD-10-CM | POA: Diagnosis not present

## 2024-02-26 DIAGNOSIS — E28319 Asymptomatic premature menopause: Secondary | ICD-10-CM

## 2024-02-26 DIAGNOSIS — F411 Generalized anxiety disorder: Secondary | ICD-10-CM

## 2024-02-26 DIAGNOSIS — N912 Amenorrhea, unspecified: Secondary | ICD-10-CM

## 2024-02-26 DIAGNOSIS — R5383 Other fatigue: Secondary | ICD-10-CM | POA: Diagnosis not present

## 2024-02-26 DIAGNOSIS — Z1231 Encounter for screening mammogram for malignant neoplasm of breast: Secondary | ICD-10-CM

## 2024-02-26 DIAGNOSIS — F331 Major depressive disorder, recurrent, moderate: Secondary | ICD-10-CM | POA: Diagnosis not present

## 2024-02-26 DIAGNOSIS — Z0000 Encounter for general adult medical examination without abnormal findings: Secondary | ICD-10-CM | POA: Diagnosis not present

## 2024-02-26 LAB — PROLACTIN: Prolactin: 7.1 ng/mL

## 2024-02-26 LAB — VITAMIN D 25 HYDROXY (VIT D DEFICIENCY, FRACTURES): VITD: 42.99 ng/mL (ref 30.00–100.00)

## 2024-02-26 LAB — VITAMIN B12: Vitamin B-12: 511 pg/mL (ref 211–911)

## 2024-02-26 LAB — ESTRADIOL: Estradiol: 26 pg/mL

## 2024-02-26 LAB — FSH/LH
FSH: 35 m[IU]/mL
LH: 20.6 m[IU]/mL

## 2024-02-26 MED ORDER — BUSPIRONE HCL 15 MG PO TABS: 15.0000 mg | ORAL_TABLET | Freq: Three times a day (TID) | ORAL | 1 refills | Status: DC

## 2024-02-26 MED ORDER — ALPRAZOLAM 0.5 MG PO TABS: 0.5000 mg | ORAL_TABLET | Freq: Three times a day (TID) | ORAL | 1 refills | Status: AC | PRN

## 2024-02-28 ENCOUNTER — Ambulatory Visit: Payer: Self-pay | Admitting: Family Medicine

## 2024-03-11 ENCOUNTER — Encounter: Payer: Self-pay | Admitting: *Deleted

## 2024-03-11 ENCOUNTER — Encounter: Payer: Self-pay | Admitting: Family Medicine

## 2024-03-11 DIAGNOSIS — F411 Generalized anxiety disorder: Secondary | ICD-10-CM | POA: Diagnosis not present

## 2024-03-11 DIAGNOSIS — F331 Major depressive disorder, recurrent, moderate: Secondary | ICD-10-CM | POA: Diagnosis not present

## 2024-04-05 DIAGNOSIS — F411 Generalized anxiety disorder: Secondary | ICD-10-CM | POA: Insufficient documentation

## 2024-04-05 DIAGNOSIS — F410 Panic disorder [episodic paroxysmal anxiety] without agoraphobia: Secondary | ICD-10-CM | POA: Insufficient documentation

## 2024-04-05 NOTE — Progress Notes (Deleted)
     Priscilla King T. Priscilla [name], MD, CAQ Sports Medicine [facility] [address] [place], [address]  Phone: [phone]  FAX: [phone]  Priscilla King - 48 y.o. female  MRN [id number]  Date of Birth: 1975-10-27  Date: 04/08/2024  PCP: Watt Mirza, MD  Referral: Watt Mirza, MD  No chief complaint on file.  Subjective:   Priscilla King is a 48 y.o. very pleasant female patient with There is no height or weight on file to calculate BMI. who presents with the following:  Discussed the use of AI scribe software for clinical note transcription with the patient, who gave verbal consent to proceed.  She is here for anxiety follow-up. She is on Zoloft  200 mg a day, BuSpar  15 mg p.o. twice daily, Elavil  at night, Xanax  0.5 mg p.o. 3 times daily.  Last time I saw her, I also sent in a consult for psychiatry. History of Present Illness     Review of Systems is noted in the HPI, as appropriate  Objective:   LMP 04/13/2023   GEN: No acute distress; alert,appropriate. PULM: Breathing comfortably in no respiratory distress PSYCH: Normally interactive.   Physical Exam   Laboratory and Imaging Data:  Assessment and Plan:   No diagnosis found. Assessment & Plan   Medication Management during today's office visit: No orders of the defined types were placed in this encounter.  There are no discontinued medications.  Orders placed today for conditions managed today: No orders of the defined types were placed in this encounter.   Disposition: No follow-ups on file.  [employer] One speech-to-text software was used for transcription in this dictation.  Possible transcriptional errors can occur using Animal nutritionist.   Signed,  Mirza DASEN. Priscilla Boultinghouse, MD   Outpatient Encounter Medications as of 04/08/2024  Medication Sig   ALPRAZolam  (XANAX ) 0.5 MG tablet Take 1 tablet (0.5 mg total) by mouth 3 (three) times daily as  needed for anxiety.   amitriptyline  (ELAVIL ) 25 MG tablet TAKE 1 TABLET BY MOUTH EVERYDAY AT BEDTIME   busPIRone  (BUSPAR ) 15 MG tablet Take 1 tablet (15 mg total) by mouth 3 (three) times daily.   celecoxib  (CELEBREX ) 200 MG capsule TAKE 1 CAPSULE BY MOUTH TWICE A DAY   diclofenac  Sodium (VOLTAREN ) 1 % GEL Apply 2 g topically 4 (four) times daily as needed.   Ferrous Sulfate  (IRON PO) Take 2 tablets by mouth daily at 6 (six) AM.   Multiple Vitamin (MULTIVITAMIN ADULT PO) Take 1 tablet by mouth daily at 6 (six) AM.   rosuvastatin  (CRESTOR ) 20 MG tablet TAKE 1 TABLET BY MOUTH EVERY DAY   Semaglutide , 2 MG/DOSE, (OZEMPIC , 2 MG/DOSE,) 8 MG/3ML SOPN INJECT 2 MG AS DIRECTED ONCE A WEEK.   sertraline  (ZOLOFT ) 100 MG tablet Take 2 pills per day   No facility-administered encounter medications on file as of 04/08/2024.

## 2024-04-08 ENCOUNTER — Ambulatory Visit: Admitting: Family Medicine

## 2024-04-10 NOTE — Telephone Encounter (Signed)
 On 03/11/24 this referral was faxed to Dr Leonarda office.   The patient was notified via MyChart letter and Direct Message of referral information

## 2024-04-15 ENCOUNTER — Ambulatory Visit
Admission: RE | Admit: 2024-04-15 | Discharge: 2024-04-15 | Disposition: A | Discharge status: Home / Self Care | Source: Ambulatory Visit | Attending: Family Medicine | Admitting: Family Medicine

## 2024-04-15 DIAGNOSIS — Z1231 Encounter for screening mammogram for malignant neoplasm of breast: Secondary | ICD-10-CM | POA: Diagnosis not present

## 2024-04-22 DIAGNOSIS — F331 Major depressive disorder, recurrent, moderate: Secondary | ICD-10-CM | POA: Diagnosis not present

## 2024-04-23 ENCOUNTER — Other Ambulatory Visit: Payer: Self-pay | Admitting: Family Medicine

## 2024-04-23 DIAGNOSIS — F331 Major depressive disorder, recurrent, moderate: Secondary | ICD-10-CM | POA: Diagnosis not present

## 2024-04-23 DIAGNOSIS — F411 Generalized anxiety disorder: Secondary | ICD-10-CM | POA: Diagnosis not present

## 2024-04-26 NOTE — Progress Notes (Deleted)
     Kenora Spayd T. Kess Mcilwain, MD, CAQ Sports Medicine [facility] at [facility] [address] [place], [address]  Phone: [phone]  FAX: [phone]  EGAN SAHLIN - 48 y.o. female  MRN [id number]  Date of Birth: 1976/01/29  Date: 04/29/2024  PCP: Watt Mirza, MD  Referral: Watt Mirza, MD  No chief complaint on file.  Subjective:   DNYLA ANTONETTI is a 48 y.o. very pleasant female patient with There is no height or weight on file to calculate BMI. who presents with the following:  Discussed the use of AI scribe software for clinical note transcription with the patient, who gave verbal consent to proceed.  She is here for anxiety follow-up. She is on Zoloft  200 mg a day, BuSpar  15 mg p.o. twice daily, Elavil  at night, Xanax  0.5 mg p.o. 3 times daily.  Last time I saw her, I also sent in a consult for psychiatry.  Diabetes has been under good control. History of Present Illness     Review of Systems is noted in the HPI, as appropriate  Objective:   LMP 04/13/2023   GEN: No acute distress; alert,appropriate. PULM: Breathing comfortably in no respiratory distress PSYCH: Normally interactive.   Physical Exam   Laboratory and Imaging Data:  Assessment and Plan:   No diagnosis found. Assessment & Plan   Medication Management during today's office visit: No orders of the defined types were placed in this encounter.  There are no discontinued medications.  Orders placed today for conditions managed today: No orders of the defined types were placed in this encounter.   Disposition: No follow-ups on file.  [employer] One speech-to-text software was used for transcription in this dictation.  Possible transcriptional errors can occur using Animal nutritionist.   Signed,  Mirza DASEN. Kuulei Kleier, MD   Outpatient Encounter Medications as of 04/29/2024  Medication Sig   ALPRAZolam  (XANAX ) 0.5 MG tablet Take 1 tablet (0.5 mg  total) by mouth 3 (three) times daily as needed for anxiety.   amitriptyline  (ELAVIL ) 25 MG tablet TAKE 1 TABLET BY MOUTH EVERYDAY AT BEDTIME   busPIRone  (BUSPAR ) 15 MG tablet Take 1 tablet (15 mg total) by mouth 3 (three) times daily.   celecoxib  (CELEBREX ) 200 MG capsule TAKE 1 CAPSULE BY MOUTH TWICE A DAY   diclofenac  Sodium (VOLTAREN ) 1 % GEL Apply 2 g topically 4 (four) times daily as needed.   Ferrous Sulfate  (IRON PO) Take 2 tablets by mouth daily at 6 (six) AM.   Multiple Vitamin (MULTIVITAMIN ADULT PO) Take 1 tablet by mouth daily at 6 (six) AM.   rosuvastatin  (CRESTOR ) 20 MG tablet TAKE 1 TABLET BY MOUTH EVERY DAY   Semaglutide , 2 MG/DOSE, (OZEMPIC , 2 MG/DOSE,) 8 MG/3ML SOPN INJECT 2 MG AS DIRECTED ONCE A WEEK   sertraline  (ZOLOFT ) 100 MG tablet Take 2 pills per day   No facility-administered encounter medications on file as of 04/29/2024.

## 2024-04-27 ENCOUNTER — Encounter: Payer: Self-pay | Admitting: Family Medicine

## 2024-04-27 NOTE — Telephone Encounter (Signed)
 Can we move her appointment back around 3 weeks?  Thanks.

## 2024-04-29 ENCOUNTER — Ambulatory Visit: Admitting: Family Medicine

## 2024-04-29 DIAGNOSIS — F411 Generalized anxiety disorder: Secondary | ICD-10-CM

## 2024-04-29 DIAGNOSIS — F410 Panic disorder [episodic paroxysmal anxiety] without agoraphobia: Secondary | ICD-10-CM

## 2024-04-29 DIAGNOSIS — F331 Major depressive disorder, recurrent, moderate: Secondary | ICD-10-CM

## 2024-05-04 DIAGNOSIS — F331 Major depressive disorder, recurrent, moderate: Secondary | ICD-10-CM | POA: Diagnosis not present

## 2024-05-04 DIAGNOSIS — F411 Generalized anxiety disorder: Secondary | ICD-10-CM | POA: Diagnosis not present

## 2024-05-08 DIAGNOSIS — F411 Generalized anxiety disorder: Secondary | ICD-10-CM | POA: Diagnosis not present

## 2024-05-08 DIAGNOSIS — F331 Major depressive disorder, recurrent, moderate: Secondary | ICD-10-CM | POA: Diagnosis not present

## 2024-05-16 NOTE — Progress Notes (Addendum)
 Priscilla Ciesla T. Rayburn Mundis, MD, CAQ Sports Medicine [facility] at [facility] [address] [place], [address]  Phone: [phone]  FAX: [phone]  Priscilla King - 48 y.o. female  MRN [id number]  Date of Birth: 1975-10-05  Date: 05/18/2024  PCP: Watt Mirza, MD  Referral: Watt Mirza, MD  No chief complaint on file.  Subjective:   Priscilla King is a 48 y.o. very pleasant female patient with There is no height or weight on file to calculate BMI. who presents with the following:  Discussed the use of AI scribe software for clinical note transcription with the patient, who gave verbal consent to proceed.  She is here for anxiety follow-up. She is on Zoloft  200 mg a day, BuSpar  15 mg p.o. twice daily, Elavil  at night, Xanax  0.5 mg p.o. 3 times daily.  Last time I saw her, I also sent in a consult for psychiatry.  Diabetes has been under good control.  Follow-up A1c today.  Diabetes Mellitus: Tolerating Medications: yes Compliance with diet: fair, There is no height or weight on file to calculate BMI. Exercise: minimal / intermittent Avg blood sugars at home: not checking Foot problems: none Hypoglycemia: none No nausea, vomitting, blurred vision, polyuria.  Lab Results  Component Value Date   HGBA1C 7.0 (H) 01/29/2024   HGBA1C 5.9 (H) 05/15/2023   HGBA1C 5.8 (A) 08/27/2022   Lab Results  Component Value Date   MICROALBUR 2.6 (H) 01/29/2024   LDLCALC 79 01/29/2024   CREATININE 0.70 01/29/2024    Wt Readings from Last 3 Encounters:  02/26/24 236 lb 4 oz (107.2 kg)  12/30/23 235 lb 3.2 oz (106.7 kg)  06/27/23 232 lb 9.6 oz (105.5 kg)    History of Present Illness     Review of Systems is noted in the HPI, as appropriate  Objective:   LMP 04/13/2023   GEN: No acute distress; alert,appropriate. PULM: Breathing comfortably in no respiratory distress PSYCH: Normally interactive.   Physical Exam   Laboratory and  Imaging Data:  Assessment and Plan:   No diagnosis found. Assessment & Plan   Medication Management during today's office visit: No orders of the defined types were placed in this encounter.  There are no discontinued medications.  Orders placed today for conditions managed today: No orders of the defined types were placed in this encounter.   Disposition: No follow-ups on file.  [employer] speech-to-text software was used for transcription in this dictation.  Possible transcriptional errors can occur using Animal nutritionist.   Signed,  Priscilla King. Calbert Hulsebus, MD   Outpatient Encounter Medications as of 05/18/2024  Medication Sig   ALPRAZolam  (XANAX ) 0.5 MG tablet Take 1 tablet (0.5 mg total) by mouth 3 (three) times daily as needed for anxiety.   amitriptyline  (ELAVIL ) 25 MG tablet TAKE 1 TABLET BY MOUTH EVERYDAY AT BEDTIME   busPIRone  (BUSPAR ) 15 MG tablet Take 1 tablet (15 mg total) by mouth 3 (three) times daily.   celecoxib  (CELEBREX ) 200 MG capsule TAKE 1 CAPSULE BY MOUTH TWICE A DAY   diclofenac  Sodium (VOLTAREN ) 1 % GEL Apply 2 g topically 4 (four) times daily as needed.   Ferrous Sulfate  (IRON PO) Take 2 tablets by mouth daily at 6 (six) AM.   Multiple Vitamin (MULTIVITAMIN ADULT PO) Take 1 tablet by mouth daily at 6 (six) AM.   rosuvastatin  (CRESTOR ) 20 MG tablet TAKE 1 TABLET BY MOUTH EVERY DAY   Semaglutide , 2 MG/DOSE, (OZEMPIC ,  2 MG/DOSE,) 8 MG/3ML SOPN INJECT 2 MG AS DIRECTED ONCE A WEEK   sertraline  (ZOLOFT ) 100 MG tablet Take 2 pills per day   No facility-administered encounter medications on file as of 05/18/2024.

## 2024-05-18 ENCOUNTER — Encounter: Payer: Self-pay | Admitting: Family Medicine

## 2024-05-18 ENCOUNTER — Ambulatory Visit (INDEPENDENT_AMBULATORY_CARE_PROVIDER_SITE_OTHER)
Admission: RE | Admit: 2024-05-18 | Discharge: 2024-05-18 | Disposition: A | Discharge status: Home / Self Care | Source: Ambulatory Visit | Attending: Family Medicine | Admitting: Family Medicine

## 2024-05-18 ENCOUNTER — Ambulatory Visit: Admitting: Family Medicine

## 2024-05-18 VITALS — BP 120/80 | HR 85 | Temp 98.0°F | Ht 65.75 in | Wt 238.0 lb

## 2024-05-18 DIAGNOSIS — G8929 Other chronic pain: Secondary | ICD-10-CM

## 2024-05-18 DIAGNOSIS — E1165 Type 2 diabetes mellitus with hyperglycemia: Secondary | ICD-10-CM | POA: Diagnosis not present

## 2024-05-18 DIAGNOSIS — E785 Hyperlipidemia, unspecified: Secondary | ICD-10-CM

## 2024-05-18 DIAGNOSIS — F411 Generalized anxiety disorder: Secondary | ICD-10-CM

## 2024-05-18 DIAGNOSIS — M25551 Pain in right hip: Secondary | ICD-10-CM

## 2024-05-18 DIAGNOSIS — F410 Panic disorder [episodic paroxysmal anxiety] without agoraphobia: Secondary | ICD-10-CM | POA: Diagnosis not present

## 2024-05-18 DIAGNOSIS — M1611 Unilateral primary osteoarthritis, right hip: Secondary | ICD-10-CM | POA: Diagnosis not present

## 2024-05-18 LAB — POCT GLYCOSYLATED HEMOGLOBIN (HGB A1C): Hemoglobin A1C: 6.6 % — AB (ref 4.0–5.6)

## 2024-05-18 NOTE — Addendum Note (Signed)
 Addended by: WATT MIRZA on: 05/18/2024 02:53 PM   Modules accepted: Orders

## 2024-05-19 ENCOUNTER — Encounter: Payer: Self-pay | Admitting: Family Medicine

## 2024-05-20 ENCOUNTER — Ambulatory Visit: Payer: Self-pay | Admitting: Family Medicine

## 2024-05-20 DIAGNOSIS — F331 Major depressive disorder, recurrent, moderate: Secondary | ICD-10-CM | POA: Diagnosis not present

## 2024-05-20 DIAGNOSIS — F411 Generalized anxiety disorder: Secondary | ICD-10-CM | POA: Diagnosis not present

## 2024-05-20 NOTE — Addendum Note (Signed)
 Addended by: WATT MIRZA on: 05/20/2024 08:16 AM   Modules accepted: Orders

## 2024-05-20 NOTE — Telephone Encounter (Signed)
 I have tried to change the MR arthrogram location to [facility] in [place] per patient request.  Please assist this change.

## 2024-06-01 ENCOUNTER — Ambulatory Visit

## 2024-06-01 ENCOUNTER — Other Ambulatory Visit

## 2024-06-03 ENCOUNTER — Other Ambulatory Visit

## 2024-06-03 DIAGNOSIS — F411 Generalized anxiety disorder: Secondary | ICD-10-CM | POA: Diagnosis not present

## 2024-06-03 DIAGNOSIS — F331 Major depressive disorder, recurrent, moderate: Secondary | ICD-10-CM | POA: Diagnosis not present

## 2024-06-05 ENCOUNTER — Other Ambulatory Visit

## 2024-06-05 DIAGNOSIS — F5105 Insomnia due to other mental disorder: Secondary | ICD-10-CM | POA: Diagnosis not present

## 2024-06-05 DIAGNOSIS — F331 Major depressive disorder, recurrent, moderate: Secondary | ICD-10-CM | POA: Diagnosis not present

## 2024-06-05 DIAGNOSIS — F411 Generalized anxiety disorder: Secondary | ICD-10-CM | POA: Diagnosis not present

## 2024-06-24 DIAGNOSIS — F411 Generalized anxiety disorder: Secondary | ICD-10-CM | POA: Diagnosis not present

## 2024-06-24 DIAGNOSIS — F331 Major depressive disorder, recurrent, moderate: Secondary | ICD-10-CM | POA: Diagnosis not present

## 2024-07-01 DIAGNOSIS — F331 Major depressive disorder, recurrent, moderate: Secondary | ICD-10-CM | POA: Diagnosis not present

## 2024-07-01 DIAGNOSIS — F411 Generalized anxiety disorder: Secondary | ICD-10-CM | POA: Diagnosis not present

## 2024-07-01 DIAGNOSIS — F5105 Insomnia due to other mental disorder: Secondary | ICD-10-CM | POA: Diagnosis not present

## 2024-07-06 ENCOUNTER — Ambulatory Visit: Admitting: Obstetrics & Gynecology

## 2024-07-06 ENCOUNTER — Encounter: Payer: Self-pay | Admitting: Obstetrics & Gynecology

## 2024-07-06 VITALS — BP 118/77 | HR 94 | Ht 68.0 in | Wt 240.0 lb

## 2024-07-06 DIAGNOSIS — R102 Pelvic and perineal pain unspecified side: Secondary | ICD-10-CM | POA: Diagnosis not present

## 2024-07-06 NOTE — Progress Notes (Signed)
° ° °  GYNECOLOGY PROGRESS NOTE  Subjective:    Patient ID: Priscilla King, female    DOB: 08/25/1975, 48 y.o.   MRN: [id number]  HPI  Patient is a 48 y.o. married G3P2012 (6 and 102 yo kids) here for followup for pelvic pain. She had an endometrial ablation in 01/2023 with Dr. Connell for DUB. This didn't work at all and she then had a RATH and bilateral salpingectomy 04/2024. Since then she continues to have constant pelvic pressure with cyclic worsening of her pelvic pain. She was already taking zoloft  100 mg so I increased the dose to 200 mg daily and that didn't help at all. She had a normal pelvic ultrasound.  She takes IBU 400 mg rarely because she takes celebrex  BID. None of this helps with the pelvic pain.  She reports no issues with bowel or bladder habits. She reports discomfort, specifically on the RLQ with sex, every position. She has an occasional hot flashes and had a FSH 4 months ago in the postmenopausal range (35).   She reports vaginal spotting several times per week, not related to sex, started about 2 months ago, worse when she should be having her periods.   She is has chronic right hip pain and her primary care provider has ordered a MRI (will be done in January).   She is fed up with this pain and wants to pursue a surgical option.  The following portions of the patient's history were reviewed and updated as appropriate: allergies, current medications, past family history, past medical history, past social history, past surgical history, and problem list.  Review of Systems Pertinent items are noted in HPI.   Objective:   Blood pressure 118/77, pulse 94, height 5' 8 (1.727 m), weight 240 lb (108.9 kg), last menstrual period 04/13/2023. Body mass index is 36.49 kg/m.  Well nourished, well hydrated  female, no apparent distress She is ambulating and conversing normally.  EG- normal Plastic speculum used to visualize the vagina and cuff. I see nothing abnormal,  nothing to explain her occasional spotting. Bimanual exam reveals no masses and no point tenderness.  Assessment:  Chronic pelvic pain  Plan:  I will order a pelvic MRI with and without contrast to be done, hopefully at the time of her hip MRI. I explained to her that if she is interested in a possible role for surgery, then she will need to go to a different office. She has seen Dr. Herchel in the past and will plan to see her after the MRI has been done.

## 2024-07-08 DIAGNOSIS — F411 Generalized anxiety disorder: Secondary | ICD-10-CM | POA: Diagnosis not present

## 2024-07-08 DIAGNOSIS — F331 Major depressive disorder, recurrent, moderate: Secondary | ICD-10-CM | POA: Diagnosis not present

## 2024-07-22 ENCOUNTER — Other Ambulatory Visit: Payer: Self-pay | Admitting: Obstetrics & Gynecology

## 2024-07-22 ENCOUNTER — Other Ambulatory Visit (HOSPITAL_COMMUNITY): Payer: Self-pay

## 2024-07-22 ENCOUNTER — Telehealth: Payer: Self-pay

## 2024-07-22 DIAGNOSIS — R102 Pelvic and perineal pain unspecified side: Secondary | ICD-10-CM

## 2024-07-22 NOTE — Telephone Encounter (Signed)
 Pharmacy Patient Advocate Encounter  Received notification from [employer] that Prior Authorization for Ozempic  (2 MG/DOSE) 8MG /3ML pen-injectors  has been APPROVED from 07/22/24 to 07/22/25. Ran test claim, Copay is $135 for a 90 day supply. This test claim was processed through [facility]- copay amounts may vary at other pharmacies due to pharmacy/plan contracts, or as the patient moves through the different stages of their insurance plan.   PA #/Case ID/Reference #: [id number]

## 2024-07-27 ENCOUNTER — Ambulatory Visit
Admission: RE | Admit: 2024-07-27 | Discharge: 2024-07-27 | Disposition: A | Discharge status: Home / Self Care | Source: Ambulatory Visit | Attending: Family Medicine | Admitting: Family Medicine

## 2024-07-27 ENCOUNTER — Encounter: Payer: Self-pay | Admitting: Family Medicine

## 2024-07-27 ENCOUNTER — Ambulatory Visit

## 2024-07-27 ENCOUNTER — Telehealth: Payer: Self-pay | Admitting: Family Medicine

## 2024-07-27 DIAGNOSIS — G8929 Other chronic pain: Secondary | ICD-10-CM

## 2024-07-27 NOTE — Telephone Encounter (Signed)
 Malia notified by telephone that PA expiration date has been extended.

## 2024-07-27 NOTE — Telephone Encounter (Signed)
 Copied from CRM #[id number]. Topic: Referral - Prior Authorization Question >> Jul 27, 2024 10:20 AM Priscilla King wrote: Reason for CRM: Priscilla King is calling from [facility] stated the referral is expire and would need a new PA. Would like to get the pt scheduled for this week. Was informed a referral was sent out on 07/22/24  Please advise [phone]

## 2024-07-27 NOTE — Telephone Encounter (Signed)
 Please help the patient with a prior auth for MR Arthrogram.  This was already done and approved, but it looks like they will need another one for 2026.

## 2024-07-28 ENCOUNTER — Ambulatory Visit
Admission: RE | Admit: 2024-07-28 | Discharge: 2024-07-28 | Disposition: A | Source: Ambulatory Visit | Attending: Family Medicine | Admitting: Family Medicine

## 2024-07-28 ENCOUNTER — Ambulatory Visit
Admission: RE | Admit: 2024-07-28 | Discharge: 2024-07-28 | Disposition: A | Discharge status: Home / Self Care | Source: Ambulatory Visit | Attending: Family Medicine | Admitting: Family Medicine

## 2024-07-28 DIAGNOSIS — G8929 Other chronic pain: Secondary | ICD-10-CM | POA: Diagnosis present

## 2024-07-28 DIAGNOSIS — M25551 Pain in right hip: Secondary | ICD-10-CM | POA: Insufficient documentation

## 2024-07-28 MED ORDER — GADOBUTROL 1 MMOL/ML IV SOLN
2.0000 mL | Freq: Once | INTRAVENOUS | Status: AC | PRN
  Administered 2024-07-28: 0.05 mL

## 2024-07-28 MED ORDER — LIDOCAINE 1 % OPTIME INJ - NO CHARGE
10.0000 mL | Freq: Once | INTRAMUSCULAR | Status: AC
  Administered 2024-07-28: 10 mL
  Filled 2024-07-28: qty 10

## 2024-07-28 MED ORDER — SODIUM CHLORIDE (PF) 0.9% IJ SOLUTION - NO CHARGE
20.0000 mL | Freq: Once | INTRAMUSCULAR | Status: AC
  Administered 2024-07-28: 5 mL via INTRAVENOUS
  Filled 2024-07-28: qty 20

## 2024-07-28 MED ORDER — IOHEXOL 180 MG/ML  SOLN
20.0000 mL | Freq: Once | INTRAMUSCULAR | Status: AC | PRN
  Administered 2024-07-28: 15 mL

## 2024-07-28 NOTE — Procedures (Signed)
 Interventional Radiology Procedure Note  Risks and benefits of arthrogram were discussed with the patient including, but not limited to bleeding, infection, injection of contrast outside the joint, and damage to adjacent structures.  All of the patient's questions were answered, patient is agreeable to proceed. Consent signed and in chart.  A timeout was performed with all members of the team prior to start of the procedure. Correct patient and correct procedure was confirmed. Allergies were reviewed.   PROCEDURE SUMMARY:  Successful fluoro guided right hip arthrogram. No immediate complications.  Pt tolerated well.   EBL = none  Please see full dictation in imaging section of Epic for procedure details.   Electronically Signed: Carlin DELENA Griffon, PA-C 07/28/2024, 11:30 AM

## 2024-07-31 ENCOUNTER — Other Ambulatory Visit: Payer: Self-pay

## 2024-07-31 ENCOUNTER — Other Ambulatory Visit: Payer: Self-pay | Admitting: Obstetrics & Gynecology

## 2024-07-31 ENCOUNTER — Ambulatory Visit
Admission: RE | Admit: 2024-07-31 | Discharge: 2024-07-31 | Disposition: A | Source: Ambulatory Visit | Attending: Obstetrics & Gynecology | Admitting: Obstetrics & Gynecology

## 2024-07-31 DIAGNOSIS — R102 Pelvic and perineal pain unspecified side: Secondary | ICD-10-CM | POA: Diagnosis present

## 2024-07-31 DIAGNOSIS — G8929 Other chronic pain: Secondary | ICD-10-CM | POA: Insufficient documentation

## 2024-07-31 MED ORDER — GADOBUTROL 1 MMOL/ML IV SOLN
10.0000 mL | Freq: Once | INTRAVENOUS | Status: AC | PRN
  Administered 2024-07-31: 10 mL via INTRAVENOUS

## 2024-07-31 NOTE — Progress Notes (Unsigned)
 mri

## 2024-08-13 ENCOUNTER — Telehealth: Payer: Self-pay

## 2024-08-13 NOTE — Telephone Encounter (Signed)
 Priscilla King called triage asking to speak to Dr. Starla to go over her MRI results and what would be the next step.

## 2024-08-18 ENCOUNTER — Telehealth: Payer: Self-pay | Admitting: Obstetrics & Gynecology

## 2024-08-18 NOTE — Telephone Encounter (Signed)
 She wants to know what to do now that her MRI results are now back. She plans to go back to see Dr. Herchel.

## 2024-08-19 ENCOUNTER — Encounter: Payer: Self-pay | Admitting: Family Medicine

## 2024-08-19 DIAGNOSIS — R102 Pelvic and perineal pain unspecified side: Secondary | ICD-10-CM

## 2024-11-16 ENCOUNTER — Ambulatory Visit: Admitting: Family Medicine
